# Patient Record
Sex: Female | Born: 1965 | Race: White | Hispanic: No | Marital: Married | Smoking: Never smoker
Health system: Southern US, Community
[De-identification: ages and names within clinical notes are randomized; demographics above are authoritative.]

## PROBLEM LIST (undated history)

## (undated) DIAGNOSIS — C439 Malignant melanoma of skin, unspecified: Secondary | ICD-10-CM

## (undated) DIAGNOSIS — F419 Anxiety disorder, unspecified: Secondary | ICD-10-CM

## (undated) DIAGNOSIS — T8859XA Other complications of anesthesia, initial encounter: Secondary | ICD-10-CM

## (undated) DIAGNOSIS — Z87442 Personal history of urinary calculi: Secondary | ICD-10-CM

## (undated) DIAGNOSIS — I83813 Varicose veins of bilateral lower extremities with pain: Secondary | ICD-10-CM

## (undated) DIAGNOSIS — T4145XA Adverse effect of unspecified anesthetic, initial encounter: Secondary | ICD-10-CM

## (undated) DIAGNOSIS — Z8719 Personal history of other diseases of the digestive system: Secondary | ICD-10-CM

## (undated) DIAGNOSIS — J189 Pneumonia, unspecified organism: Secondary | ICD-10-CM

## (undated) DIAGNOSIS — I219 Acute myocardial infarction, unspecified: Secondary | ICD-10-CM

## (undated) DIAGNOSIS — M1990 Unspecified osteoarthritis, unspecified site: Secondary | ICD-10-CM

## (undated) DIAGNOSIS — I499 Cardiac arrhythmia, unspecified: Secondary | ICD-10-CM

## (undated) DIAGNOSIS — G43909 Migraine, unspecified, not intractable, without status migrainosus: Secondary | ICD-10-CM

## (undated) DIAGNOSIS — J410 Simple chronic bronchitis: Secondary | ICD-10-CM

## (undated) DIAGNOSIS — T7840XA Allergy, unspecified, initial encounter: Secondary | ICD-10-CM

## (undated) DIAGNOSIS — E119 Type 2 diabetes mellitus without complications: Secondary | ICD-10-CM

## (undated) DIAGNOSIS — I10 Essential (primary) hypertension: Secondary | ICD-10-CM

## (undated) DIAGNOSIS — R002 Palpitations: Secondary | ICD-10-CM

## (undated) DIAGNOSIS — J45909 Unspecified asthma, uncomplicated: Secondary | ICD-10-CM

## (undated) HISTORY — DX: Allergy, unspecified, initial encounter: T78.40XA

## (undated) HISTORY — DX: Migraine, unspecified, not intractable, without status migrainosus: G43.909

## (undated) HISTORY — PX: JOINT REPLACEMENT: SHX530

## (undated) HISTORY — PX: TONSILLECTOMY: SUR1361

## (undated) HISTORY — PX: WISDOM TOOTH EXTRACTION: SHX21

## (undated) HISTORY — PX: KNEE ARTHROSCOPY: SUR90

## (undated) HISTORY — DX: Acute myocardial infarction, unspecified: I21.9

## (undated) HISTORY — PX: ANAL FISSURE REPAIR: SHX2312

## (undated) HISTORY — DX: Malignant melanoma of skin, unspecified: C43.9

## (undated) HISTORY — DX: Essential (primary) hypertension: I10

## (undated) HISTORY — DX: Type 2 diabetes mellitus without complications: E11.9

---
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
## 2008-03-08 ENCOUNTER — Ambulatory Visit: Payer: Self-pay | Admitting: Family Medicine

## 2008-04-25 ENCOUNTER — Ambulatory Visit: Payer: Self-pay | Admitting: Internal Medicine

## 2008-04-25 IMAGING — CR RIGHT FOOT COMPLETE - 3+ VIEW
1 series · 3 of 3 positions shown · non-contrast
Comparison: none

REASON FOR EXAM: Pain
COMMENTS:

PROCEDURE:     MDR - MDR FOOT RT COMP W/OBLIQUES  - [DATE] [DATE]
RESULT:     No fracture, dislocation or other acute bony abnormality is
identified. No definite arthritic changes are seen. No lytic or blastic
lesions are noted. No radiodense soft tissue foreign body is identified.

[Series 1: view not recorded · 0.17mm/px · 3 of 3 slices shown]
[im 1/3]
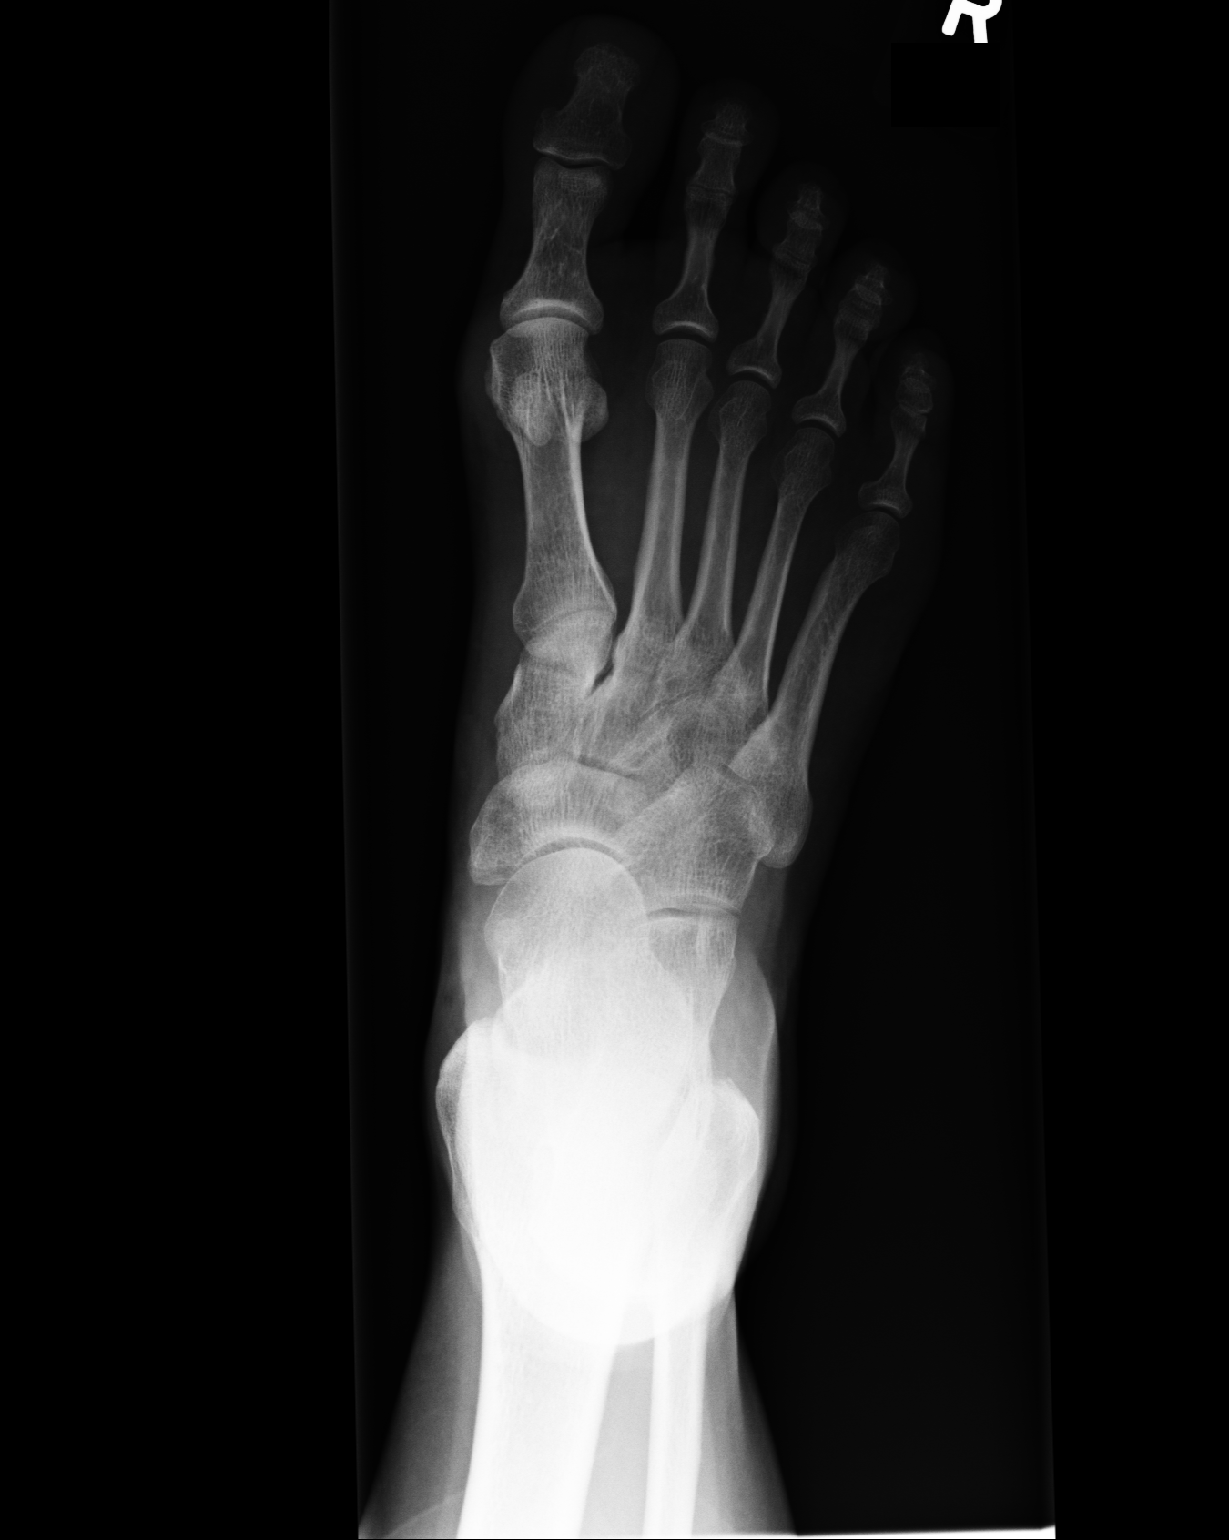
[im 2/3]
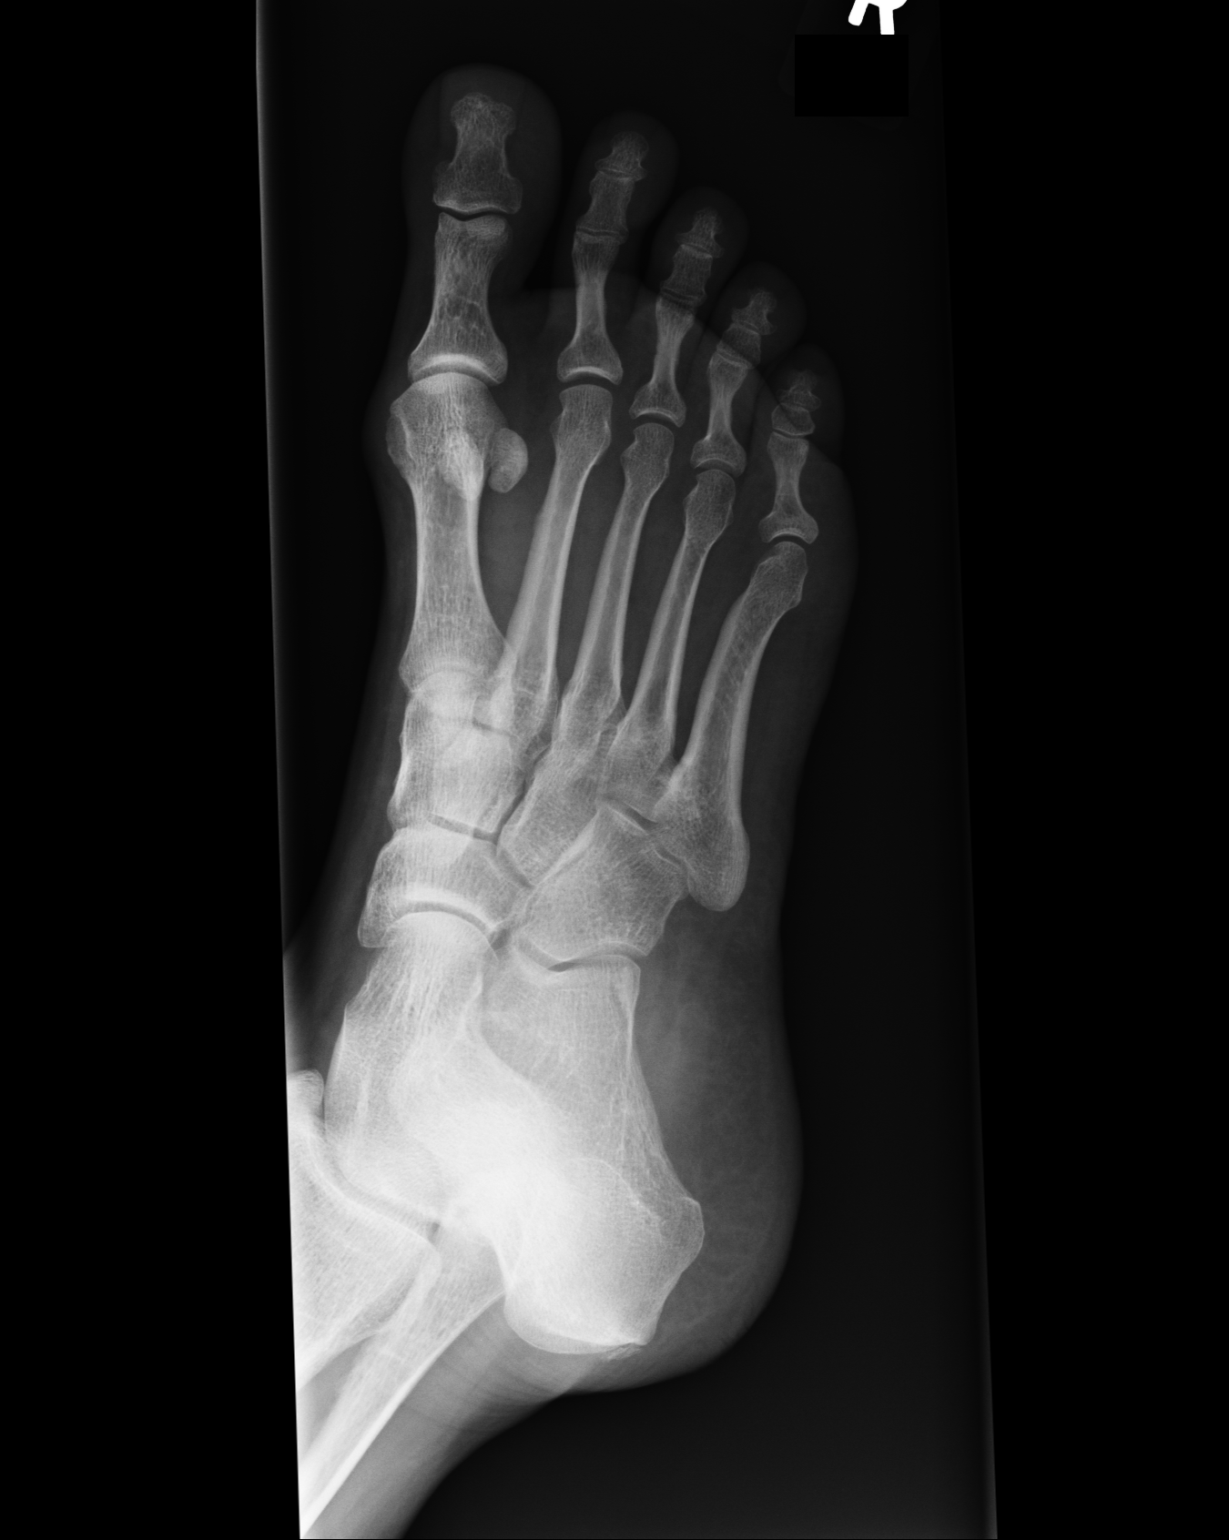
[im 3/3]
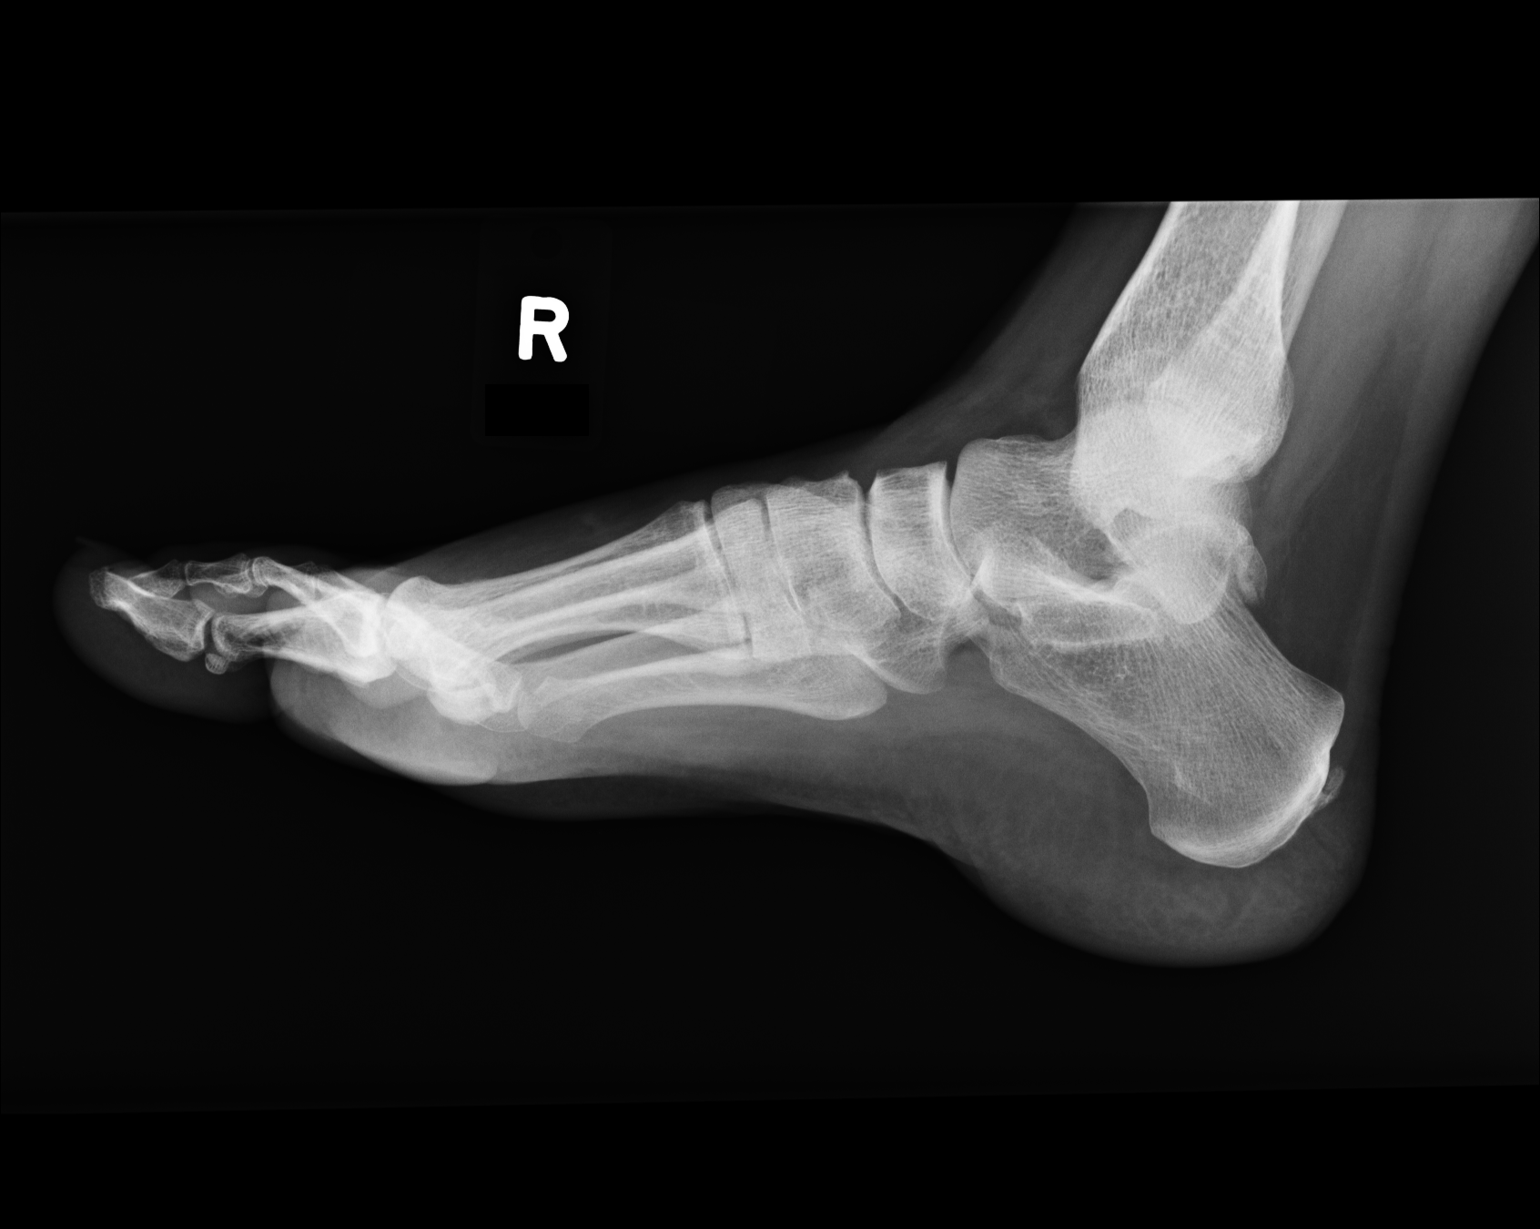

[3 of 3 positions shown; findings below may reference images not displayed]

IMPRESSION: No significant abnormalities are identified.

## 2011-10-12 DIAGNOSIS — G43709 Chronic migraine without aura, not intractable, without status migrainosus: Secondary | ICD-10-CM | POA: Insufficient documentation

## 2011-10-12 DIAGNOSIS — G43009 Migraine without aura, not intractable, without status migrainosus: Secondary | ICD-10-CM | POA: Insufficient documentation

## 2011-12-27 DIAGNOSIS — G500 Trigeminal neuralgia: Secondary | ICD-10-CM | POA: Insufficient documentation

## 2011-12-27 DIAGNOSIS — G894 Chronic pain syndrome: Secondary | ICD-10-CM | POA: Insufficient documentation

## 2012-08-09 DIAGNOSIS — I219 Acute myocardial infarction, unspecified: Secondary | ICD-10-CM

## 2012-08-09 HISTORY — DX: Acute myocardial infarction, unspecified: I21.9

## 2012-11-02 ENCOUNTER — Ambulatory Visit: Payer: Self-pay

## 2012-11-02 LAB — RAPID STREP-A WITH REFLX: Micro Text Report: NEGATIVE

## 2013-01-01 ENCOUNTER — Ambulatory Visit: Payer: Self-pay | Admitting: Family Medicine

## 2013-02-13 DIAGNOSIS — Z96659 Presence of unspecified artificial knee joint: Secondary | ICD-10-CM | POA: Insufficient documentation

## 2013-03-01 DIAGNOSIS — I10 Essential (primary) hypertension: Secondary | ICD-10-CM | POA: Insufficient documentation

## 2013-03-01 DIAGNOSIS — I252 Old myocardial infarction: Secondary | ICD-10-CM | POA: Insufficient documentation

## 2013-03-01 DIAGNOSIS — E119 Type 2 diabetes mellitus without complications: Secondary | ICD-10-CM | POA: Insufficient documentation

## 2013-03-11 DIAGNOSIS — R002 Palpitations: Secondary | ICD-10-CM | POA: Insufficient documentation

## 2013-07-30 DIAGNOSIS — R55 Syncope and collapse: Secondary | ICD-10-CM

## 2013-07-30 HISTORY — DX: Syncope and collapse: R55

## 2014-06-26 DIAGNOSIS — F419 Anxiety disorder, unspecified: Secondary | ICD-10-CM | POA: Insufficient documentation

## 2017-01-20 IMAGING — US US RENAL
1 series · 14 of 25 positions shown · non-contrast
Comparison: Abdomen pelvis radiograph obtained today.

CLINICAL DATA: Acute low back pain. Clinical concern for
nephrolithiasis.

EXAM:
RENAL / URINARY TRACT ULTRASOUND COMPLETE

[Series 1: us renal · 0.22mm/px · 14 of 46 slices shown]
[im 1/46]
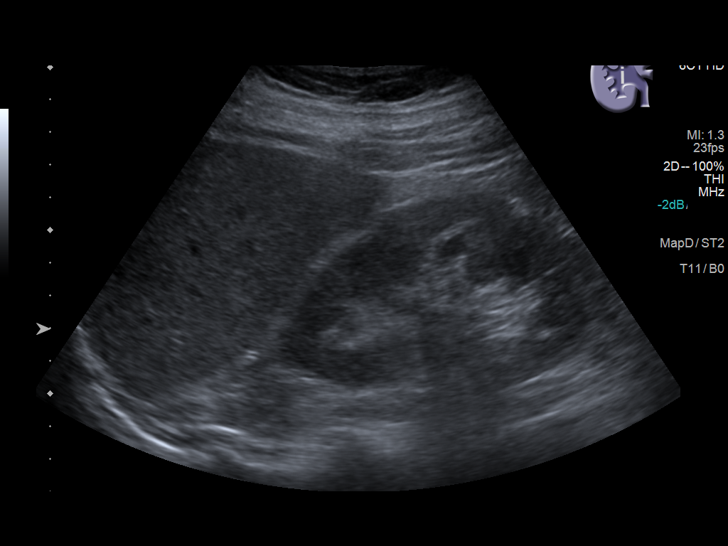
[im 4/46]
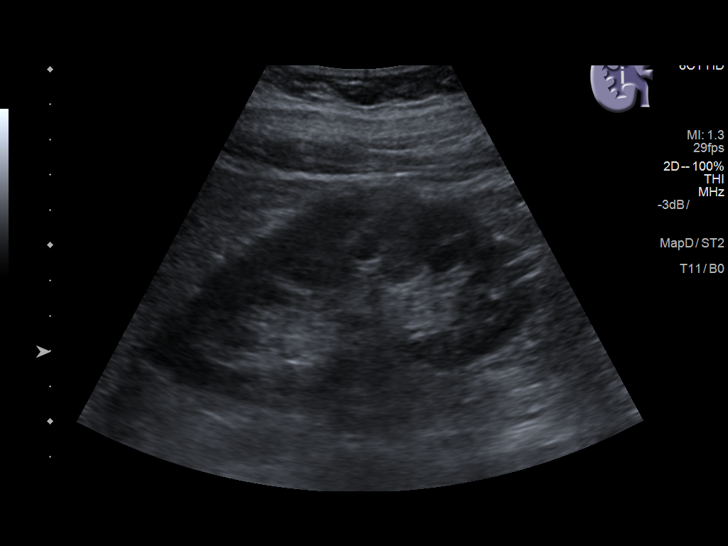
[im 8/46]
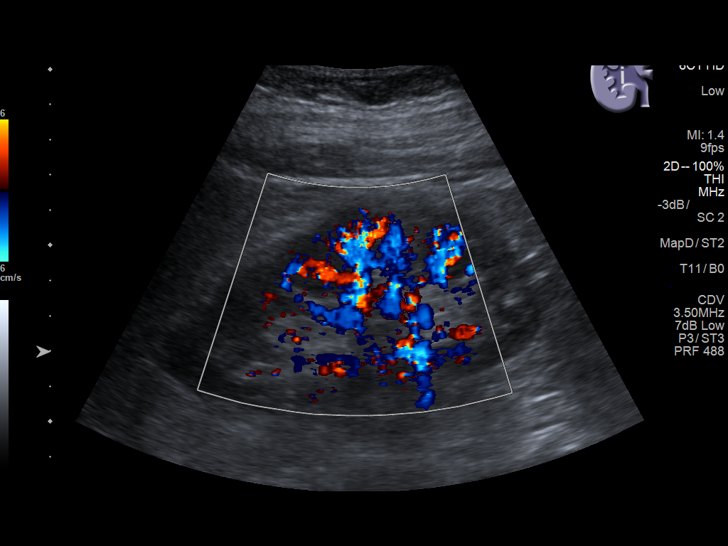
[im 12/46]
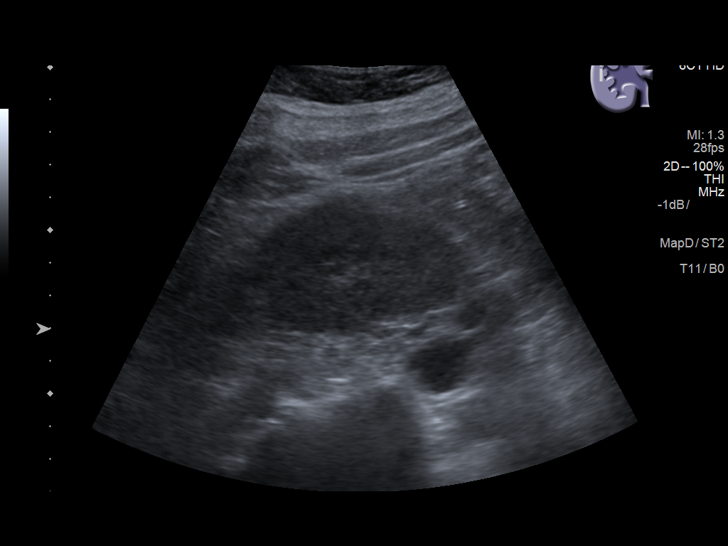
[im 16/46]
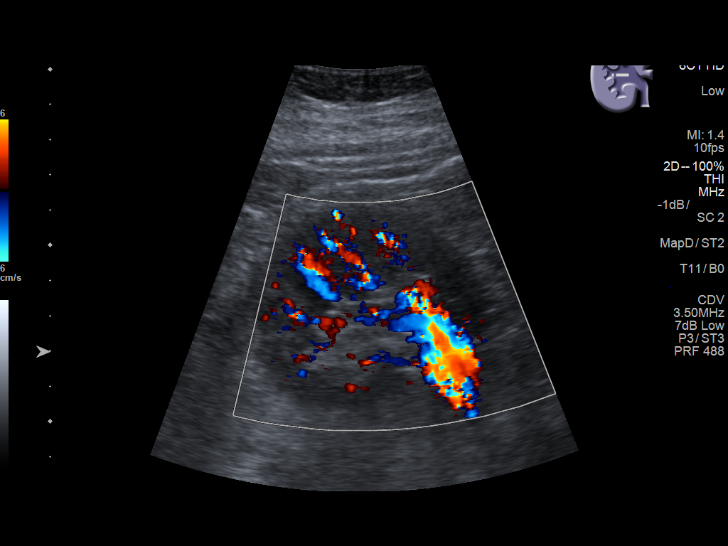
[im 17/46]
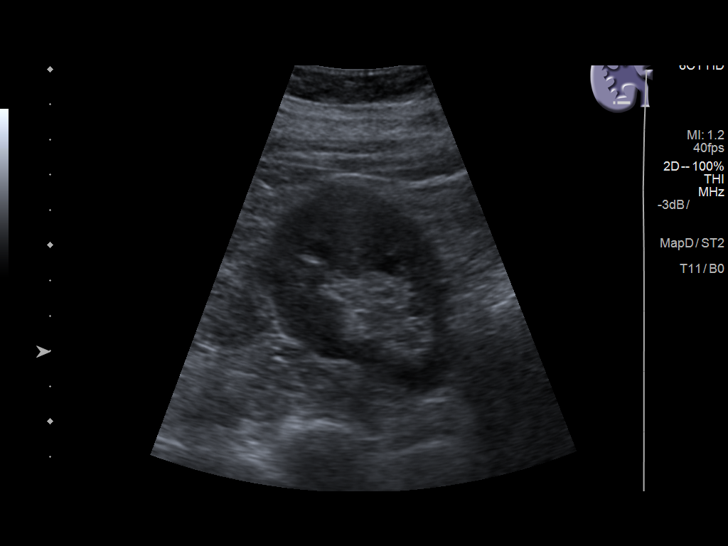
[im 21/46]
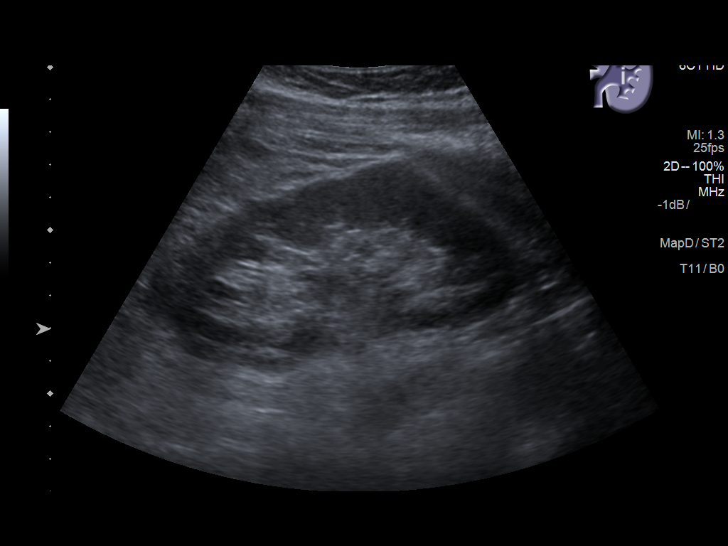
[im 25/46]
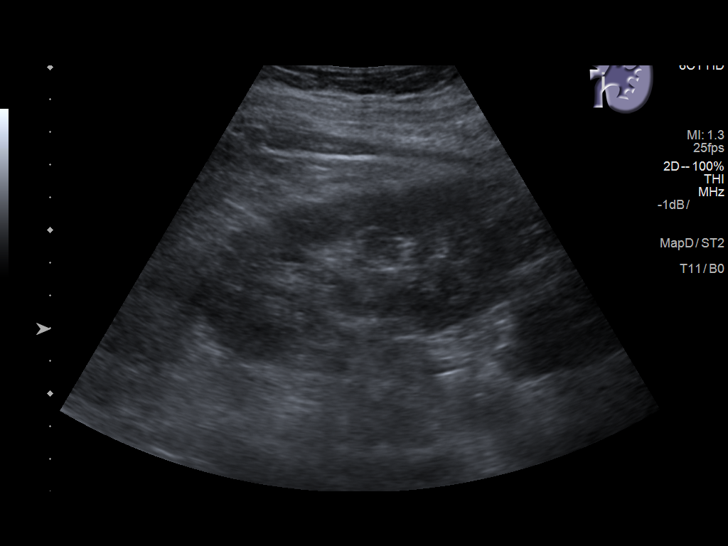
[im 29/46]
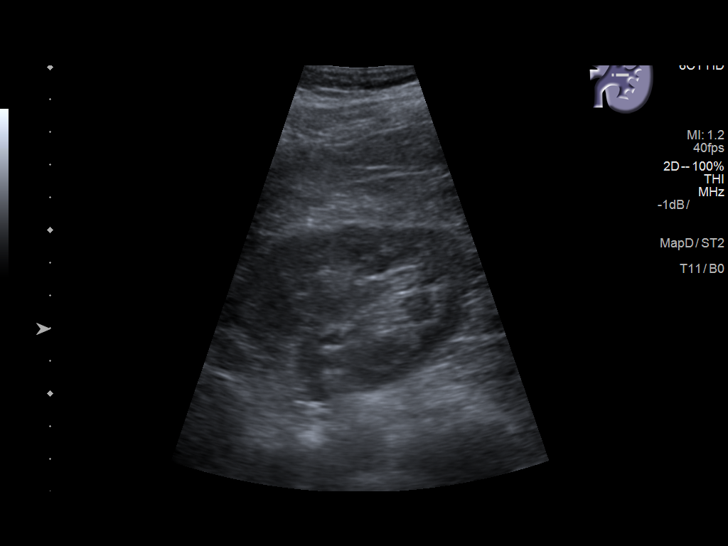
[im 31/46]
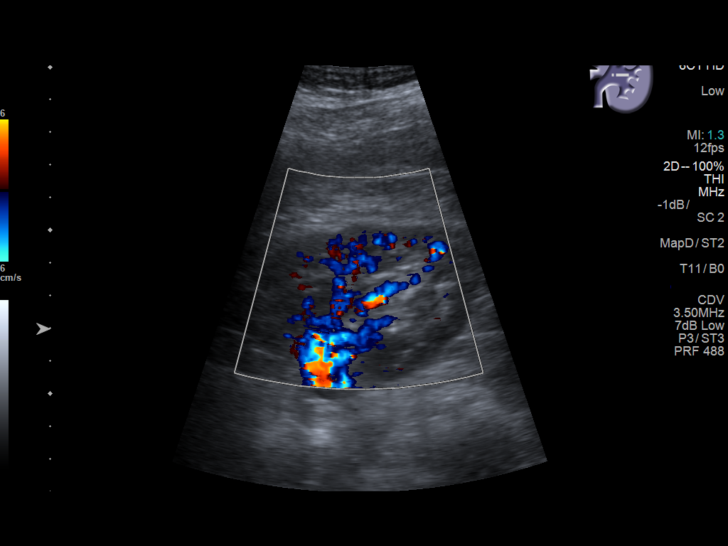
[im 34/46]
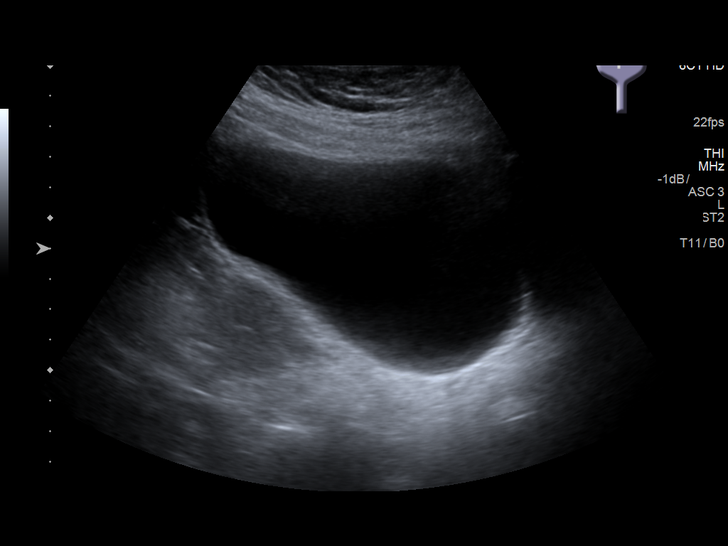
[im 38/46]
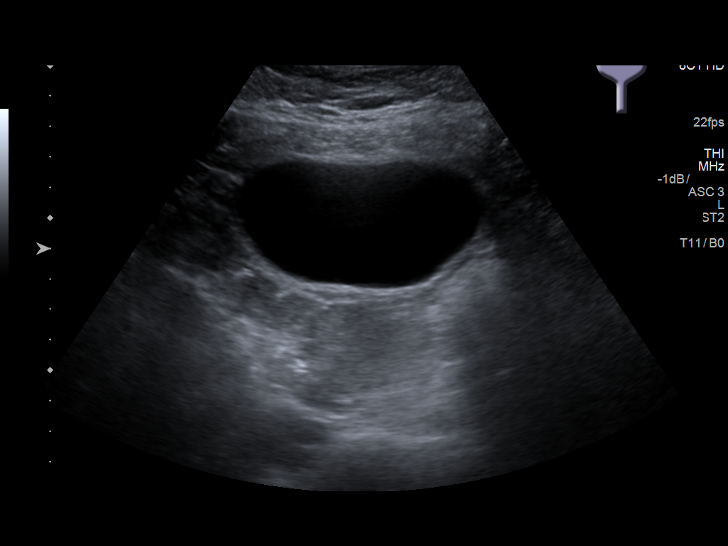
[im 42/46]
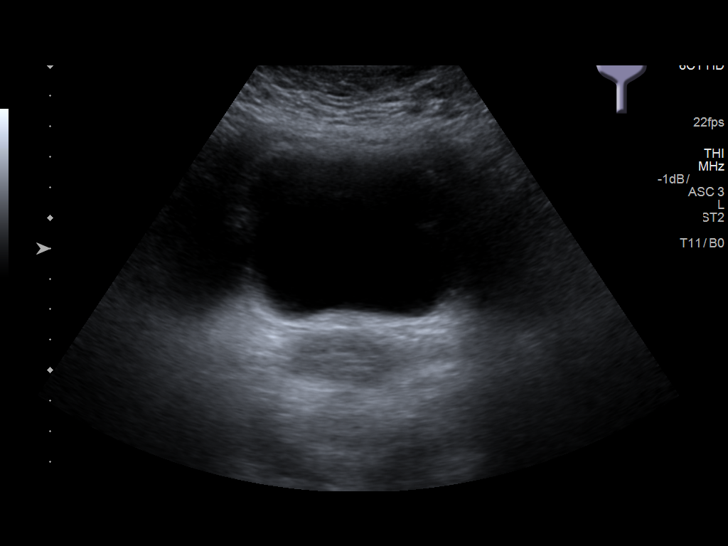
[im 46/46]
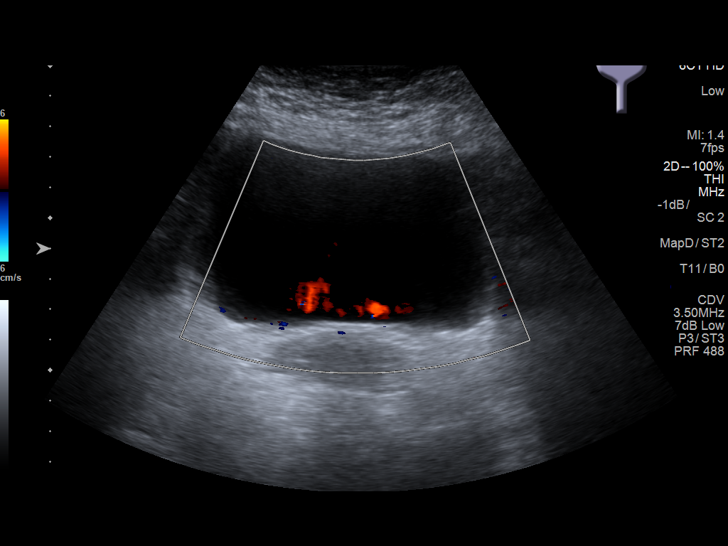

[14 of 25 positions shown; findings below may reference images not displayed]

FINDINGS: Right Kidney:

Renal measurements: 11.0 x 5.3 x 5.1 cm = volume: 158 mL .
Echogenicity within normal limits. No mass or hydronephrosis
visualized.

Left Kidney:

Renal measurements: 11.9 x 5.7 x 5.5 cm = volume: 195 mL.
Echogenicity within normal limits. No mass or hydronephrosis
visualized.

Bladder:

Appears normal for degree of bladder distention.
IMPRESSION: Normal examination.

## 2017-07-12 ENCOUNTER — Ambulatory Visit: Payer: Self-pay | Admitting: Podiatry

## 2017-08-22 ENCOUNTER — Encounter: Payer: Self-pay | Admitting: Family Medicine

## 2017-08-22 ENCOUNTER — Other Ambulatory Visit: Payer: Self-pay

## 2017-08-22 ENCOUNTER — Ambulatory Visit (INDEPENDENT_AMBULATORY_CARE_PROVIDER_SITE_OTHER): Payer: BLUE CROSS/BLUE SHIELD | Admitting: Family Medicine

## 2017-08-22 VITALS — BP 132/84 | HR 76 | Temp 98.2°F | Resp 16 | Wt 200.0 lb

## 2017-08-22 DIAGNOSIS — I10 Essential (primary) hypertension: Secondary | ICD-10-CM

## 2017-08-22 DIAGNOSIS — E1159 Type 2 diabetes mellitus with other circulatory complications: Secondary | ICD-10-CM

## 2017-08-22 DIAGNOSIS — I519 Heart disease, unspecified: Secondary | ICD-10-CM | POA: Diagnosis not present

## 2017-08-22 DIAGNOSIS — K219 Gastro-esophageal reflux disease without esophagitis: Secondary | ICD-10-CM | POA: Diagnosis not present

## 2017-08-22 DIAGNOSIS — E785 Hyperlipidemia, unspecified: Secondary | ICD-10-CM | POA: Diagnosis not present

## 2017-08-22 DIAGNOSIS — L719 Rosacea, unspecified: Secondary | ICD-10-CM

## 2017-08-22 DIAGNOSIS — E119 Type 2 diabetes mellitus without complications: Secondary | ICD-10-CM | POA: Diagnosis not present

## 2017-08-22 DIAGNOSIS — Z7689 Persons encountering health services in other specified circumstances: Secondary | ICD-10-CM

## 2017-08-22 DIAGNOSIS — M1990 Unspecified osteoarthritis, unspecified site: Secondary | ICD-10-CM

## 2017-08-22 DIAGNOSIS — I152 Hypertension secondary to endocrine disorders: Secondary | ICD-10-CM

## 2017-08-22 DIAGNOSIS — B379 Candidiasis, unspecified: Secondary | ICD-10-CM

## 2017-08-22 MED ORDER — ATORVASTATIN CALCIUM 40 MG PO TABS: 40.0000 mg | ORAL_TABLET | Freq: Every day | ORAL | 3 refills | Status: DC

## 2017-08-22 MED ORDER — GLIPIZIDE 10 MG PO TABS: 10.0000 mg | ORAL_TABLET | Freq: Every day | ORAL | 3 refills | Status: DC

## 2017-08-22 MED ORDER — DOXYCYCLINE HYCLATE 20 MG PO TABS: 20.0000 mg | ORAL_TABLET | Freq: Two times a day (BID) | ORAL | 3 refills | Status: DC

## 2017-08-22 MED ORDER — OMEPRAZOLE 20 MG PO CPDR: 20.0000 mg | DELAYED_RELEASE_CAPSULE | Freq: Two times a day (BID) | ORAL | 3 refills | Status: DC

## 2017-08-22 MED ORDER — NAPROXEN 500 MG PO TABS: 500.0000 mg | ORAL_TABLET | Freq: Two times a day (BID) | ORAL | 3 refills | Status: DC

## 2017-08-22 MED ORDER — METOPROLOL SUCCINATE ER 25 MG PO TB24: 25.0000 mg | ORAL_TABLET | Freq: Every day | ORAL | 3 refills | Status: DC

## 2017-08-22 MED ORDER — NITROGLYCERIN 0.4 MG SL SUBL: 0.4000 mg | SUBLINGUAL_TABLET | SUBLINGUAL | 3 refills | Status: DC | PRN

## 2017-08-22 MED ORDER — BENAZEPRIL HCL 40 MG PO TABS: 40.0000 mg | ORAL_TABLET | Freq: Every day | ORAL | 3 refills | Status: DC

## 2017-08-22 MED ORDER — METFORMIN HCL 1000 MG PO TABS: 1000.0000 mg | ORAL_TABLET | Freq: Two times a day (BID) | ORAL | 3 refills | Status: DC

## 2017-08-22 MED ORDER — FLUCONAZOLE 100 MG PO TABS: ORAL_TABLET | ORAL | 3 refills | Status: DC

## 2017-08-22 NOTE — Progress Notes (Signed)
Judith Fox  MRN: [id number] DOB: [birth year]  Subjective:  HPI   Patient is a 52 year old female who presents to establish care and refill medications.  There are no active problems to display for this patient.   Past Medical History:  Diagnosis Date  . Allergy   . Diabetes mellitus without complication ([place])   . Hypertension   . Migraine   . Myocardial infarction [facility])     Social History   Socioeconomic History  . Marital status: Married    Spouse name: Not on file  . Number of children: Not on file  . Years of education: Not on file  . Highest education level: Not on file  Social Needs  . Financial resource strain: Not on file  . Food insecurity - worry: Not on file  . Food insecurity - inability: Not on file  . Transportation needs - medical: Not on file  . Transportation needs - non-medical: Not on file  Occupational History  . Not on file  Tobacco Use  . Smoking status: Never Smoker  Substance and Sexual Activity  . Alcohol use: Yes    Alcohol/week: 1.2 oz    Types: 1 Glasses of wine, 1 Shots of liquor per week    Comment: average varies, possibly once monthly  . Drug use: No  . Sexual activity: Not on file  Other Topics Concern  . Not on file  Social History Narrative  . Not on file    Outpatient Encounter Medications as of 08/22/2017  Medication Sig  . aspirin EC 81 MG tablet Take 81 mg by mouth daily.  Marland Kitchen atorvastatin (LIPITOR) 40 MG tablet Take 1 tablet (40 mg total) by mouth daily.  . benazepril (LOTENSIN) 40 MG tablet Take 1 tablet (40 mg total) by mouth daily.  . diphenhydrAMINE (BENADRYL) 50 MG/ML injection Inject 50 mg into the vein once.  . docusate sodium (STOOL SOFTENER) 100 MG capsule Take 100 mg by mouth 2 (two) times daily.  Marland Kitchen doxycycline (PERIOSTAT) 20 MG tablet Take 1 tablet (20 mg total) by mouth 2 (two) times daily.  . fexofenadine (ALLEGRA) 180 MG tablet Take 180 mg by mouth daily.  . fluconazole (DIFLUCAN) 100 MG tablet  Once weekly and as needed  . glipiZIDE (GLUCOTROL) 10 MG tablet Take 1 tablet (10 mg total) by mouth daily before breakfast.  . ketorolac (TORADOL) 30 MG/ML injection Inject 30 mg into the vein once.  . metFORMIN (GLUCOPHAGE) 1000 MG tablet Take 1 tablet (1,000 mg total) by mouth 2 (two) times daily with a meal.  . metoCLOPramide (REGLAN) 5 MG/ML injection Inject 10 mg into the vein.  . Multiple Vitamin (MULTIVITAMIN) capsule Take 1 capsule by mouth daily.  . naproxen (NAPROSYN) 500 MG tablet Take 1 tablet (500 mg total) by mouth 2 (two) times daily with a meal.  . omeprazole (PRILOSEC) 20 MG capsule Take 1 capsule (20 mg total) by mouth 2 (two) times daily before a meal.  . [DISCONTINUED] atorvastatin (LIPITOR) 40 MG tablet Take 40 mg by mouth daily.  . [DISCONTINUED] benazepril (LOTENSIN) 40 MG tablet Take 40 mg by mouth daily.  . [DISCONTINUED] doxycycline (PERIOSTAT) 20 MG tablet Take 20 mg by mouth 2 (two) times daily.  . [DISCONTINUED] fluconazole (DIFLUCAN) 100 MG tablet Take 100 mg by mouth once a week.  . [DISCONTINUED] glipiZIDE (GLUCOTROL) 10 MG tablet Take 10 mg by mouth daily before breakfast.  . [DISCONTINUED] metFORMIN (GLUCOPHAGE) 1000 MG tablet Take 1,000 mg by mouth  2 (two) times daily with a meal.  . [DISCONTINUED] metoprolol tartrate (LOPRESSOR) 25 MG tablet Take 25 mg by mouth daily.  . [DISCONTINUED] naproxen (NAPROSYN) 500 MG tablet Take 500 mg by mouth 2 (two) times daily with a meal.  . [DISCONTINUED] omeprazole (PRILOSEC) 20 MG capsule Take 20 mg by mouth 2 (two) times daily before a meal.  . metoprolol succinate (TOPROL-XL) 25 MG 24 hr tablet Take 1 tablet (25 mg total) by mouth daily.  . nitroGLYCERIN (NITROSTAT) 0.4 MG SL tablet Place 1 tablet (0.4 mg total) under the tongue every 5 (five) minutes as needed for chest pain.   No facility-administered encounter medications on file as of 08/22/2017.     Allergies  Allergen Reactions  . Stadol [Butorphanol]      Review of Systems  Constitutional: Negative.   HENT: Negative.   Eyes: Negative.   Respiratory: Negative.   Cardiovascular: Negative.   Gastrointestinal: Positive for abdominal pain and diarrhea.       Bloating   Genitourinary: Negative.   Musculoskeletal: Positive for neck pain (with  migraines).  Skin: Negative.   Neurological: Negative.   Endo/Heme/Allergies: Negative.   Psychiatric/Behavioral: Negative.     Objective:  BP 132/84 (BP Location: Right Arm, Patient Position: Sitting, Cuff Size: Normal)   Pulse 76   Temp 98.2 F (36.8 C) (Oral)   Resp 16   Wt 200 lb (90.7 kg)   Physical Exam  Constitutional: She is oriented to person, place, and time and well-developed, well-nourished, and in no distress.  HENT:  Head: Normocephalic and atraumatic.  Eyes: Conjunctivae are normal. No scleral icterus.  Neck: No thyromegaly present.  Cardiovascular: Normal rate, regular rhythm and normal heart sounds.  Pulmonary/Chest: Effort normal and breath sounds normal.  Abdominal: Soft.  Neurological: She is alert and oriented to person, place, and time. Gait normal. GCS score is 15.  Skin: Skin is warm and dry.  Psychiatric: Mood, memory, affect and judgment normal.    Assessment and Plan :   1. Diabetes mellitus without complication (HCC) H7W is 6.7 today. - Comprehensive metabolic panel - Lipid Panel With LDL/HDL Ratio - Hemoglobin A1c - glipiZIDE (GLUCOTROL) 10 MG tablet; Take 1 tablet (10 mg total) by mouth daily before breakfast.  Dispense: 90 tablet; Refill: 3 - metFORMIN (GLUCOPHAGE) 1000 MG tablet; Take 1 tablet (1,000 mg total) by mouth 2 (two) times daily with a meal.  Dispense: 180 tablet; Refill: 3  2. Heart disease  - Ambulatory referral to Cardiology - Lipid Panel With LDL/HDL Ratio - metoprolol succinate (TOPROL-XL) 25 MG 24 hr tablet; Take 1 tablet (25 mg total) by mouth daily.  Dispense: 90 tablet; Refill: 3 - nitroGLYCERIN (NITROSTAT) 0.4 MG SL tablet;  Place 1 tablet (0.4 mg total) under the tongue every 5 (five) minutes as needed for chest pain.  Dispense: 50 tablet; Refill: 3  3. Hyperlipidemia, unspecified hyperlipidemia type  - Comprehensive metabolic panel - atorvastatin (LIPITOR) 40 MG tablet; Take 1 tablet (40 mg total) by mouth daily.  Dispense: 90 tablet; Refill: 3  4. Hypertension associated with diabetes ([place])  - CBC with Differential/Platelet - Comprehensive metabolic panel - TSH - benazepril (LOTENSIN) 40 MG tablet; Take 1 tablet (40 mg total) by mouth daily.  Dispense: 90 tablet; Refill: 3 - metoprolol succinate (TOPROL-XL) 25 MG 24 hr tablet; Take 1 tablet (25 mg total) by mouth daily.  Dispense: 90 tablet; Refill: 3  5. Rosacea  - doxycycline (PERIOSTAT) 20 MG tablet; Take 1 tablet (  20 mg total) by mouth 2 (two) times daily.  Dispense: 180 tablet; Refill: 3 6. Monilia infection  - fluconazole (DIFLUCAN) 100 MG tablet; Once weekly and as needed  Dispense: 24 tablet; Refill: 3  7. Arthritis  - naproxen (NAPROSYN) 500 MG tablet; Take 1 tablet (500 mg total) by mouth 2 (two) times daily with a meal.  Dispense: 180 tablet; Refill: 3  8. Gastroesophageal reflux disease, esophagitis presence not specified  - omeprazole (PRILOSEC) 20 MG capsule; Take 1 capsule (20 mg total) by mouth 2 (two) times daily before a meal.  Dispense: 180 capsule; Refill: 3 9.Migraine Headache Refer back to neurology.  I have done the exam and reviewed the chart and it is accurate to the best of my knowledge. Development worker, community has been used and  any errors in dictation or transcription are unintentional. Miguel Aschoff M.D. [facility]

## 2017-08-23 ENCOUNTER — Telehealth: Payer: Self-pay

## 2017-08-23 NOTE — Telephone Encounter (Signed)
Spoke with patient. She has tried prevacid in the past, no other medications. Omeprazole has worked great for her symptoms. PA completed through cover my meds and has been approved. Pharmacy advised through fax about this and patient advised on her [place], RMA

## 2017-08-23 NOTE — Telephone Encounter (Signed)
LMTCB I need to do PA on Omeprazole. Patient is new to the practice. I need to know if she has taking any other PPIs in the past before starting on PA. -Kris Mouton, RMA

## 2017-09-05 DIAGNOSIS — E782 Mixed hyperlipidemia: Secondary | ICD-10-CM | POA: Insufficient documentation

## 2017-09-09 ENCOUNTER — Ambulatory Visit (INDEPENDENT_AMBULATORY_CARE_PROVIDER_SITE_OTHER): Payer: BLUE CROSS/BLUE SHIELD | Admitting: Family Medicine

## 2017-09-09 ENCOUNTER — Encounter: Payer: Self-pay | Admitting: Family Medicine

## 2017-09-09 VITALS — BP 138/90 | HR 105 | Temp 98.3°F | Ht 69.5 in | Wt 197.6 lb

## 2017-09-09 DIAGNOSIS — B349 Viral infection, unspecified: Secondary | ICD-10-CM | POA: Diagnosis not present

## 2017-09-09 MED ORDER — OSELTAMIVIR PHOSPHATE 75 MG PO CAPS: 75.0000 mg | ORAL_CAPSULE | Freq: Two times a day (BID) | ORAL | 0 refills | Status: DC

## 2017-09-09 MED ORDER — HYDROCODONE-HOMATROPINE 5-1.5 MG/5ML PO SYRP
5.0000 mL | ORAL_SOLUTION | Freq: Three times a day (TID) | ORAL | 0 refills | Status: DC | PRN
Start: 2017-09-09 — End: 2017-10-18

## 2017-09-09 NOTE — Progress Notes (Signed)
Patient: Judith Fox Female    DOB: 11/21/1965   52 y.o.   MRN: [id number] Visit Date: 09/09/2017  Today's Provider: Vernie Murders, PA   Chief Complaint  Patient presents with  . URI   Subjective:    URI   This is a new problem. Episode onset: 3 days ago. The problem has been gradually worsening. Maximum temperature: unknown. Associated symptoms include chest pain, congestion, coughing, headaches, rhinorrhea, sneezing and vomiting (during coughing spells). Pertinent negatives include no sore throat. Associated symptoms comments: Nasal congestion,dizziness, and watery eyes. She has tried acetaminophen (OTC cold medication and cough suppressant) for the symptoms.   Past Medical History:  Diagnosis Date  . Allergy   . Diabetes mellitus without complication ([place])   . Hypertension   . Migraine   . Myocardial infarction [facility])     Family History  Problem Relation Age of Onset  . Migraines Mother   . Cancer Maternal Aunt        lung  . Cancer Maternal Grandmother        breast  . Diabetes Maternal Grandmother   . Cancer Father        died age 67 with metastatic cancer, uncertain origin  . Hypertension Brother    Allergies  Allergen Reactions  . Latex Rash and Swelling    Eye swelling   . Butorphanol Other (See Comments)    Other reaction(s): Other (See Comments) drowsiness somnolence   . Butorphanol Tartrate     Other reaction(s): Other (See Comments), Other (See Comments) drowsiness drowsiness somnolence     Current Outpatient Medications:  .  aspirin EC 81 MG tablet, Take 81 mg by mouth daily., Disp: , Rfl:  .  atorvastatin (LIPITOR) 40 MG tablet, Take 40 mg by mouth daily., Disp: , Rfl: 3 .  benazepril (LOTENSIN) 40 MG tablet, Take 40 mg by mouth daily., Disp: , Rfl: 1 .  diphenhydrAMINE (BENADRYL) 50 MG/ML injection, Inject 50 mg into the vein once., Disp: , Rfl:  .  docusate sodium (STOOL SOFTENER) 100 MG capsule, Take 100 mg by mouth 2  (two) times daily., Disp: , Rfl:  .  doxycycline (PERIOSTAT) 20 MG tablet, Take 1 tablet (20 mg total) by mouth 2 (two) times daily., Disp: 180 tablet, Rfl: 3 .  fexofenadine (ALLEGRA) 180 MG tablet, Take 180 mg by mouth daily., Disp: , Rfl:  .  fluconazole (DIFLUCAN) 100 MG tablet, Once weekly and as needed, Disp: 24 tablet, Rfl: 3 .  glipiZIDE (GLUCOTROL XL) 10 MG 24 hr tablet, Take 10 mg by mouth daily., Disp: , Rfl: 0 .  indomethacin (INDOCIN) 25 MG capsule, TAKE 2 CAPSULES BY MOUTH 3 TIMES A DAY AS NEEDED, Disp: , Rfl: 0 .  ketorolac (TORADOL) 30 MG/ML injection, INJ UTD ONCE FOR MIGRAINE, Disp: , Rfl: 5 .  metFORMIN (GLUCOPHAGE) 1000 MG tablet, Take 1 tablet (1,000 mg total) by mouth 2 (two) times daily with a meal., Disp: 180 tablet, Rfl: 3 .  metoCLOPramide (REGLAN) 5 MG/ML injection, Inject 10 mg into the vein., Disp: , Rfl:  .  Multiple Vitamin (MULTIVITAMIN) capsule, Take 1 capsule by mouth daily., Disp: , Rfl:  .  naproxen (NAPROSYN) 500 MG tablet, Take 500 mg by mouth 2 (two) times daily., Disp: , Rfl: 3 .  nitroGLYCERIN (NITROSTAT) 0.4 MG SL tablet, Place 1 tablet (0.4 mg total) under the tongue every 5 (five) minutes as needed for chest pain., Disp: 50 tablet, Rfl:  3 .  omeprazole (PRILOSEC) 20 MG capsule, Take 1 capsule (20 mg total) by mouth 2 (two) times daily before a meal., Disp: 180 capsule, Rfl: 3  Review of Systems  Constitutional: Negative.   HENT: Positive for congestion, rhinorrhea and sneezing. Negative for sore throat.   Eyes: Positive for discharge.  Respiratory: Positive for cough.   Cardiovascular: Positive for chest pain.  Gastrointestinal: Positive for vomiting (during coughing spells).  Neurological: Positive for dizziness and headaches.   Social History   Tobacco Use  . Smoking status: Never Smoker  . Smokeless tobacco: Never Used  Substance Use Topics  . Alcohol use: Yes    Alcohol/week: 1.2 oz    Types: 1 Glasses of wine, 1 Shots of liquor per week      Comment: average varies, possibly once monthly   Objective:   BP 138/90 (BP Location: Right Arm, Patient Position: Sitting, Cuff Size: Normal)   Pulse (!) 105   Temp 98.3 F (36.8 C) (Oral)   Ht 5' 9.5" (1.765 m)   Wt 197 lb 9.6 oz (89.6 kg)   SpO2 97%   BMI 28.76 kg/m   Physical Exam  Constitutional: She is oriented to person, place, and time. She appears well-developed and well-nourished. No distress.  HENT:  Head: Normocephalic and atraumatic.  Right Ear: Hearing and external ear normal.  Left Ear: Hearing and external ear normal.  Nose: Nose normal.  Eyes: Conjunctivae and lids are normal. Right eye exhibits no discharge. Left eye exhibits no discharge. No scleral icterus.  Neck: Neck supple.  Cardiovascular:  tachycardia  Pulmonary/Chest: Effort normal and breath sounds normal. No respiratory distress.  Abdominal: Soft. Bowel sounds are normal.  Musculoskeletal: Normal range of motion.  Lymphadenopathy:    She has no cervical adenopathy.  Neurological: She is alert and oriented to person, place, and time.  Skin: Skin is intact. No lesion and no rash noted.  Psychiatric: She has a normal mood and affect. Her speech is normal and behavior is normal. Thought content normal.      Assessment & Plan:     1. Acute viral syndrome Onset with cough, body aches, chills, rhinorrhea and headache over the past 2-3 days. Suspect influenza. Will treat with Tamiflu and Hycodan. Increase fluid intake and use Tylenol or Advil prn. Recheck if no better in 5-6 days. - oseltamivir (TAMIFLU) 75 MG capsule; Take 1 capsule (75 mg total) by mouth 2 (two) times daily.  Dispense: 10 capsule; Refill: 0 - HYDROcodone-homatropine (HYCODAN) 5-1.5 MG/5ML syrup; Take 5 mLs by mouth every 8 (eight) hours as needed for cough.  Dispense: 120 mL; Refill: 0       Vernie Murders, PA  [facility]

## 2017-09-09 NOTE — Patient Instructions (Signed)

## 2017-09-12 ENCOUNTER — Encounter: Payer: Self-pay | Admitting: Family Medicine

## 2017-09-12 ENCOUNTER — Ambulatory Visit (INDEPENDENT_AMBULATORY_CARE_PROVIDER_SITE_OTHER): Payer: BLUE CROSS/BLUE SHIELD | Admitting: Family Medicine

## 2017-09-12 ENCOUNTER — Ambulatory Visit
Admission: RE | Admit: 2017-09-12 | Discharge: 2017-09-12 | Disposition: A | Discharge status: Home / Self Care | Payer: BLUE CROSS/BLUE SHIELD | Source: Ambulatory Visit | Attending: Family Medicine | Admitting: Family Medicine

## 2017-09-12 ENCOUNTER — Telehealth: Payer: Self-pay | Admitting: Family Medicine

## 2017-09-12 VITALS — BP 128/84 | HR 107 | Temp 98.1°F | Resp 16 | Wt 193.0 lb

## 2017-09-12 DIAGNOSIS — R059 Cough, unspecified: Secondary | ICD-10-CM

## 2017-09-12 DIAGNOSIS — R05 Cough: Secondary | ICD-10-CM

## 2017-09-12 DIAGNOSIS — R1112 Projectile vomiting: Secondary | ICD-10-CM | POA: Diagnosis not present

## 2017-09-12 DIAGNOSIS — J181 Lobar pneumonia, unspecified organism: Secondary | ICD-10-CM | POA: Insufficient documentation

## 2017-09-12 DIAGNOSIS — J410 Simple chronic bronchitis: Secondary | ICD-10-CM | POA: Diagnosis not present

## 2017-09-12 IMAGING — CR DG CHEST 2V
1 series · 2 of 2 positions shown · non-contrast
Comparison: None.

CLINICAL DATA: Cough.  Fever and body aches.

EXAM:
CHEST  2 VIEW

[Series 1: dg chest 2 view · 0.14mm/px · 2 of 2 slices shown]
[im 1/2]
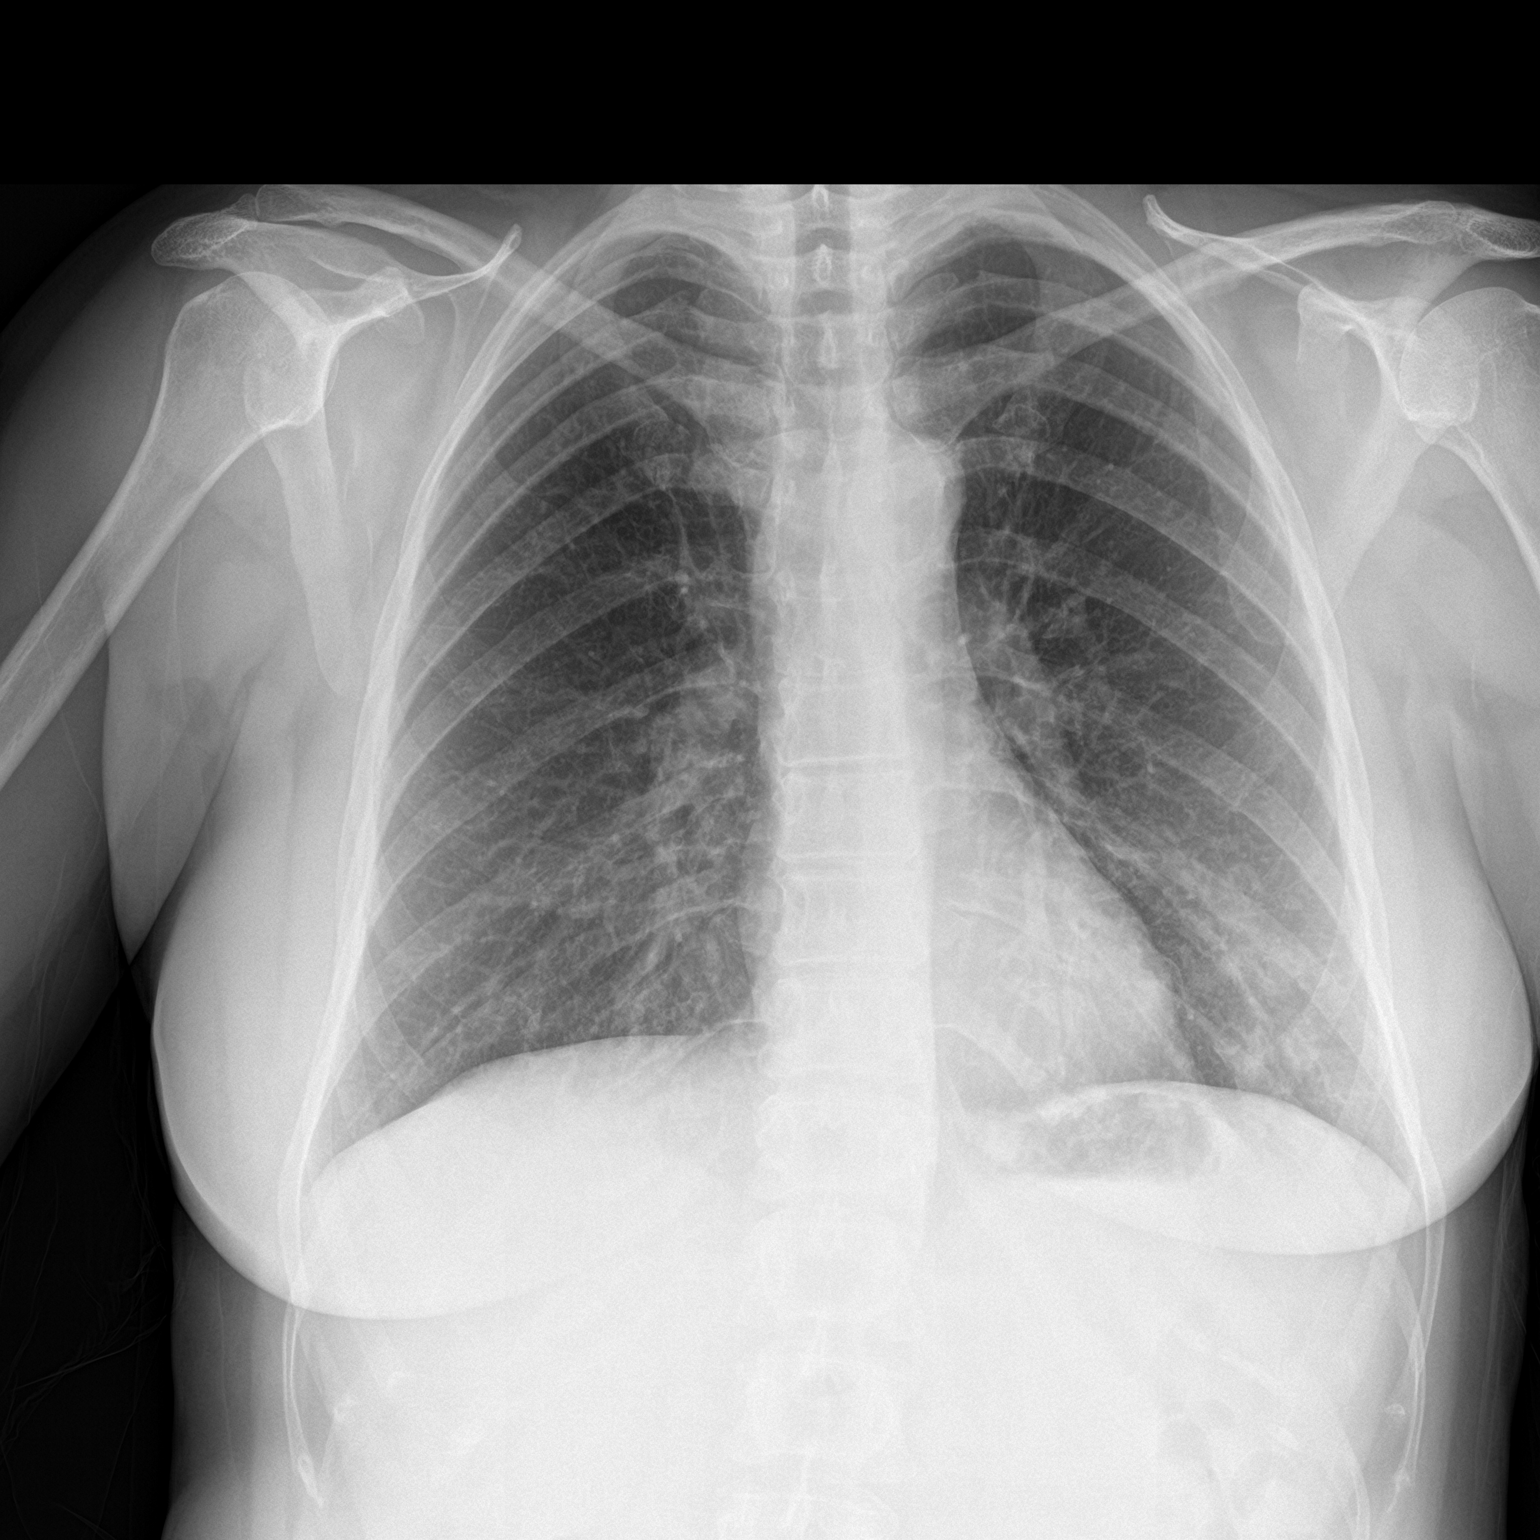
[im 2/2]
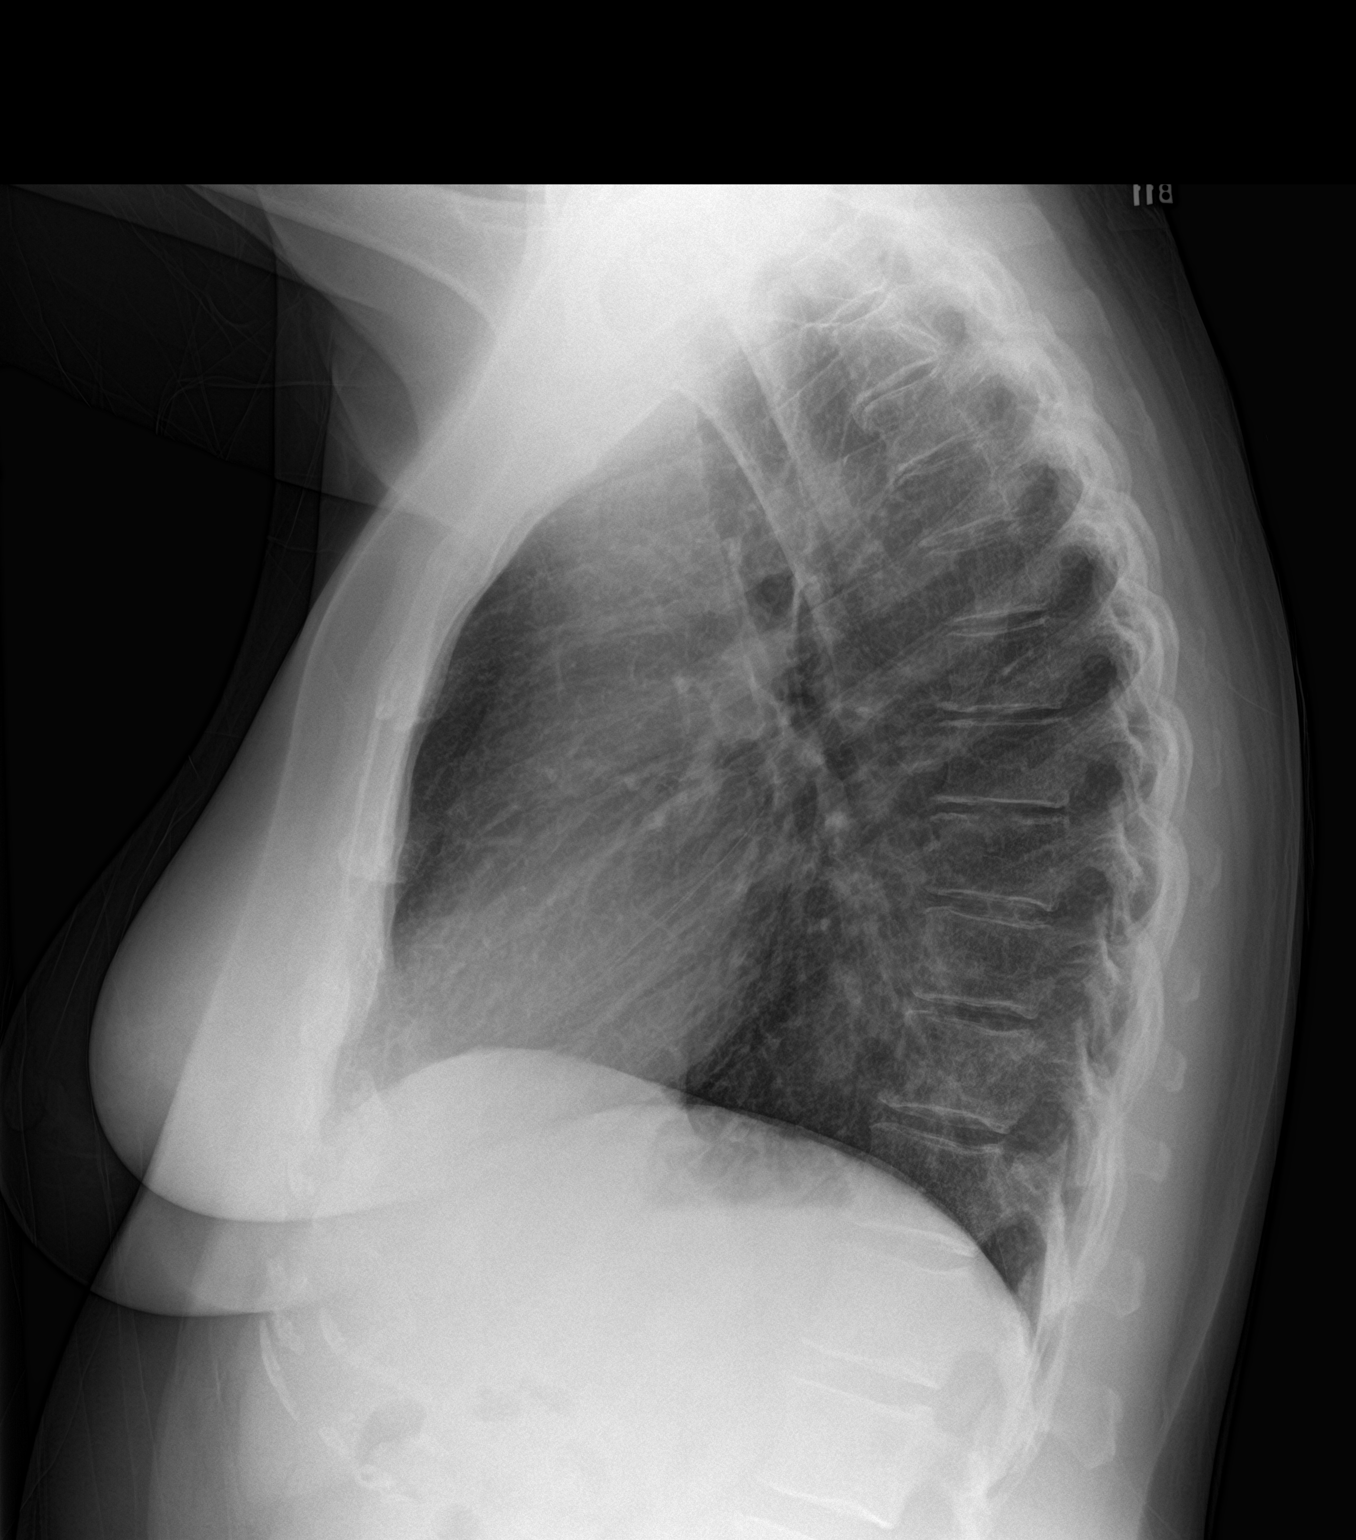

[2 of 2 positions shown; findings below may reference images not displayed]

FINDINGS: Airspace disease in the left lower lobe. No visible cavitation or
effusion. No Kerley lines. Normal heart size and mediastinal
contours. Patient has already been started on antibiotics per EMR.
IMPRESSION: Left lower lobe pneumonia. Followup PA and lateral chest X-ray is
recommended in 3-4 weeks following trial of antibiotic therapy to
ensure resolution.

## 2017-09-12 MED ORDER — PROMETHAZINE HCL 25 MG PO TABS: 25.0000 mg | ORAL_TABLET | Freq: Four times a day (QID) | ORAL | 0 refills | Status: DC | PRN

## 2017-09-12 MED ORDER — AZITHROMYCIN 250 MG PO TABS: ORAL_TABLET | ORAL | 0 refills | Status: DC

## 2017-09-12 MED ORDER — HYDROCOD POLST-CPM POLST ER 10-8 MG/5ML PO SUER: 5.0000 mL | Freq: Two times a day (BID) | ORAL | 0 refills | Status: DC | PRN

## 2017-09-12 MED ORDER — BENZONATATE 100 MG PO CAPS: 100.0000 mg | ORAL_CAPSULE | Freq: Two times a day (BID) | ORAL | 0 refills | Status: DC | PRN

## 2017-09-12 NOTE — Progress Notes (Signed)
Patient: Judith Fox Female    DOB: 30-Oct-1965   52 y.o.   MRN: [id number] Visit Date: 09/12/2017  Today's Provider: Wilhemena Durie, MD   Chief Complaint  Patient presents with  . URI   Subjective:    URI   This is a new (Pt was seen 09/09/2017 by Simona Huh, who diagnosed pt with acute viral syndrome, and treated her with Tamiflu and Hycodan. She states she is not improving.) problem. There has been no fever. Associated symptoms include chest pain, congestion, coughing, headaches, nausea, neck pain (from migraine), rhinorrhea, sneezing, a sore throat, vomiting (projectile vomiting per pt) and wheezing. Pertinent negatives include no abdominal pain, ear pain, sinus pain or swollen glands. Associated symptoms comments: She is also c/o decreased appetite. Treatments tried: Daytime Cold and Flu, Nighttime Cold and Flu, cool mist humidifier. The treatment provided no relief.  She needs a refill on her Hycodan.    Allergies  Allergen Reactions  . Latex Rash and Swelling    Eye swelling   . Butorphanol Other (See Comments)    Other reaction(s): Other (See Comments) drowsiness somnolence   . Butorphanol Tartrate     Other reaction(s): Other (See Comments), Other (See Comments) drowsiness drowsiness somnolence      Current Outpatient Medications:  .  aspirin EC 81 MG tablet, Take 81 mg by mouth daily., Disp: , Rfl:  .  atorvastatin (LIPITOR) 40 MG tablet, Take 40 mg by mouth daily., Disp: , Rfl: 3 .  benazepril (LOTENSIN) 40 MG tablet, Take 40 mg by mouth daily., Disp: , Rfl: 1 .  diphenhydrAMINE (BENADRYL) 50 MG/ML injection, Inject 50 mg into the vein once., Disp: , Rfl:  .  docusate sodium (STOOL SOFTENER) 100 MG capsule, Take 100 mg by mouth 2 (two) times daily., Disp: , Rfl:  .  doxycycline (PERIOSTAT) 20 MG tablet, Take 1 tablet (20 mg total) by mouth 2 (two) times daily., Disp: 180 tablet, Rfl: 3 .  fexofenadine (ALLEGRA) 180 MG tablet, Take 180 mg by mouth  daily., Disp: , Rfl:  .  fluconazole (DIFLUCAN) 100 MG tablet, Once weekly and as needed, Disp: 24 tablet, Rfl: 3 .  glipiZIDE (GLUCOTROL XL) 10 MG 24 hr tablet, Take 10 mg by mouth daily., Disp: , Rfl: 0 .  HYDROcodone-homatropine (HYCODAN) 5-1.5 MG/5ML syrup, Take 5 mLs by mouth every 8 (eight) hours as needed for cough., Disp: 120 mL, Rfl: 0 .  indomethacin (INDOCIN) 25 MG capsule, TAKE 2 CAPSULES BY MOUTH 3 TIMES A DAY AS NEEDED, Disp: , Rfl: 0 .  ketorolac (TORADOL) 30 MG/ML injection, INJ UTD ONCE FOR MIGRAINE, Disp: , Rfl: 5 .  metFORMIN (GLUCOPHAGE) 1000 MG tablet, Take 1 tablet (1,000 mg total) by mouth 2 (two) times daily with a meal., Disp: 180 tablet, Rfl: 3 .  metoCLOPramide (REGLAN) 5 MG/ML injection, Inject 10 mg into the vein., Disp: , Rfl:  .  Multiple Vitamin (MULTIVITAMIN) capsule, Take 1 capsule by mouth daily., Disp: , Rfl:  .  naproxen (NAPROSYN) 500 MG tablet, Take 500 mg by mouth 2 (two) times daily., Disp: , Rfl: 3 .  nitroGLYCERIN (NITROSTAT) 0.4 MG SL tablet, Place 1 tablet (0.4 mg total) under the tongue every 5 (five) minutes as needed for chest pain., Disp: 50 tablet, Rfl: 3 .  omeprazole (PRILOSEC) 20 MG capsule, Take 1 capsule (20 mg total) by mouth 2 (two) times daily before a meal., Disp: 180 capsule, Rfl: 3 .  oseltamivir (TAMIFLU) 75 MG capsule, Take 1 capsule (75 mg total) by mouth 2 (two) times daily., Disp: 10 capsule, Rfl: 0  Review of Systems  Constitutional: Positive for appetite change and unexpected weight change.  HENT: Positive for congestion, rhinorrhea, sneezing and sore throat. Negative for ear pain and sinus pain.   Respiratory: Positive for cough and wheezing.   Cardiovascular: Positive for chest pain.  Gastrointestinal: Positive for nausea and vomiting (projectile vomiting per pt). Negative for abdominal pain.  Musculoskeletal: Positive for neck pain (from migraine).  Neurological: Positive for headaches.    Social History   Tobacco Use    . Smoking status: Never Smoker  . Smokeless tobacco: Never Used  Substance Use Topics  . Alcohol use: Yes    Alcohol/week: 1.2 oz    Types: 1 Glasses of wine, 1 Shots of liquor per week    Comment: average varies, possibly once monthly   Objective:   BP 128/84 (BP Location: Right Arm, Patient Position: Sitting, Cuff Size: Large)   Pulse (!) 107   Temp 98.1 F (36.7 C) (Oral)   Resp 16   Wt 193 lb (87.5 kg)   SpO2 97%   BMI 28.09 kg/m  Vitals:   09/12/17 1020  BP: 128/84  Pulse: (!) 107  Resp: 16  Temp: 98.1 F (36.7 C)  TempSrc: Oral  SpO2: 97%  Weight: 193 lb (87.5 kg)     Physical Exam  Constitutional: She appears well-developed and well-nourished.  HENT:  Head: Normocephalic.  Right Ear: External ear normal.  Left Ear: External ear normal.  Nose: Nose normal.  Mouth/Throat: Oropharynx is clear and moist. No oropharyngeal exudate.  Eyes: Conjunctivae are normal. Pupils are equal, round, and reactive to light.  Neck: Normal range of motion. Neck supple.  Cardiovascular: Regular rhythm and normal heart sounds.  Pulmonary/Chest: No respiratory distress.  Rhonchi bilateral lung bases.  Lymphadenopathy:    She has no cervical adenopathy.  Psychiatric: She has a normal mood and affect. Her behavior is normal.        Assessment & Plan:     1. Cough Worsening. Check CXR. FU pending results. Treat symptomatically with Tussionex and tessalon perles. - chlorpheniramine-HYDROcodone (TUSSIONEX PENNKINETIC ER) 10-8 MG/5ML SUER; Take 5 mLs by mouth every 12 (twelve) hours as needed for cough.  Dispense: 140 mL; Refill: 0 - DG Chest 2 View; Future - benzonatate (TESSALON) 100 MG capsule; Take 1 capsule (100 mg total) by mouth 2 (two) times daily as needed for cough.  Dispense: 20 capsule; Refill: 0  2. Projectile vomiting with nausea Phenergan as below. - promethazine (PHENERGAN) 25 MG tablet; Take 1 tablet (25 mg total) by mouth every 6 (six) hours as needed for  nausea or vomiting.  Dispense: 35 tablet; Refill: 0  3. Simple chronic bronchitis ([place]) Start abx. FU if sx do not  Improve/worsen. FU pending CXR results. - azithromycin (ZITHROMAX) 250 MG tablet; Take 2 tablets on day 1, then 1 tab each remaining day until gone.  Dispense: 6 tablet; Refill: 0     Patient seen and examined by Miguel Aschoff, MD, and note scribed by Martha Clan, CMA.  Richard Cranford Mon, MD  [facility]

## 2017-09-12 NOTE — Telephone Encounter (Signed)
Pt decided to make OV appt no need for message. Thanks TNP

## 2017-09-13 ENCOUNTER — Telehealth: Payer: Self-pay

## 2017-09-13 NOTE — Telephone Encounter (Signed)
[facility]  ED    ----- Message from Jerrol Banana., MD sent at 09/12/2017  4:19 PM EST ----- Pt does have a pneumonia.Probably out of work this week. RTC 1 month. Rest and push fluids.

## 2017-09-13 NOTE — Telephone Encounter (Signed)
-----   Message from Jerrol Banana., MD sent at 09/12/2017  4:19 PM EST ----- Pt does have a pneumonia.Probably out of work this week. RTC 1 month. Rest and push fluids.

## 2017-09-13 NOTE — Telephone Encounter (Signed)
Advised  ED 

## 2017-09-19 ENCOUNTER — Encounter: Payer: Self-pay | Admitting: Family Medicine

## 2017-09-19 ENCOUNTER — Ambulatory Visit (INDEPENDENT_AMBULATORY_CARE_PROVIDER_SITE_OTHER): Payer: BLUE CROSS/BLUE SHIELD | Admitting: Family Medicine

## 2017-09-19 VITALS — BP 116/76 | HR 108 | Temp 97.7°F | Resp 16 | Wt 193.0 lb

## 2017-09-19 DIAGNOSIS — J181 Lobar pneumonia, unspecified organism: Secondary | ICD-10-CM

## 2017-09-19 DIAGNOSIS — R05 Cough: Secondary | ICD-10-CM | POA: Diagnosis not present

## 2017-09-19 DIAGNOSIS — R059 Cough, unspecified: Secondary | ICD-10-CM

## 2017-09-19 DIAGNOSIS — J189 Pneumonia, unspecified organism: Secondary | ICD-10-CM

## 2017-09-19 MED ORDER — PREDNISONE 20 MG PO TABS
ORAL_TABLET | ORAL | 0 refills | Status: DC
Start: 2017-09-19 — End: 2017-10-18

## 2017-09-19 MED ORDER — BENZONATATE 100 MG PO CAPS: ORAL_CAPSULE | ORAL | 0 refills | Status: DC

## 2017-09-19 NOTE — Patient Instructions (Signed)
May use Allegra D for congestion. Call for increased sinus pressure or yellow-green drainage or cough becoming increasingly productive. Don't forget to call about your follow-up chest x-ray in 4 weeks.

## 2017-09-19 NOTE — Progress Notes (Signed)
Subjective:     Patient ID: Judith Fox, female   DOB: 11/26/1965, 52 y.o.   MRN: [id number] Chief Complaint  Patient presents with  . Pneumonia    Follow - up; Feeling better but still coughing and wheezing   HPI Has completed course of Zithromax in the last two days. Reports cough is minimally productive but continues to have bout of coughing triggering dry heaves/vomiting. Has been using Tessalon Perles and Tussionex. Sinus drainage is clear. Accompanied by her s.o. Today.  Review of Systems     Objective:   Physical Exam  Constitutional: She appears well-developed and well-nourished. She appears distressed (modest distress from persistent cough.).  Ears: T.M's intact without inflammation Sinuses: non-tender Throat: tonsils are absent, no pharyngeal erythema Neck: left anterior cervical node Lungs: clear     Assessment:    1. Pneumonia of left lower lobe due to infectious organism ([place] - predniSONE (DELTASONE) 20 MG tablet; One pill twice daily  Dispense: 10 tablet; Refill: 0 - benzonatate (TESSALON) 100 MG capsule; Take 1-2 every 8 hours as needed for cough  Dispense: 30 capsule; Refill: 0    Plan:    May try Allegra D for congestion. Phone f/u if not continuing to improve. F/u CXR in 4 weeks.

## 2017-10-04 ENCOUNTER — Encounter: Payer: Self-pay | Admitting: Intensive Care

## 2017-10-04 ENCOUNTER — Emergency Department
Admission: EM | Admit: 2017-10-04 | Discharge: 2017-10-04 | Disposition: A | Discharge status: Home / Self Care | Payer: BLUE CROSS/BLUE SHIELD | Attending: Emergency Medicine | Admitting: Emergency Medicine

## 2017-10-04 DIAGNOSIS — E119 Type 2 diabetes mellitus without complications: Secondary | ICD-10-CM | POA: Insufficient documentation

## 2017-10-04 DIAGNOSIS — Z96659 Presence of unspecified artificial knee joint: Secondary | ICD-10-CM | POA: Insufficient documentation

## 2017-10-04 DIAGNOSIS — G43101 Migraine with aura, not intractable, with status migrainosus: Secondary | ICD-10-CM | POA: Insufficient documentation

## 2017-10-04 DIAGNOSIS — I10 Essential (primary) hypertension: Secondary | ICD-10-CM | POA: Diagnosis not present

## 2017-10-04 MED ORDER — DIPHENHYDRAMINE HCL 50 MG/ML IJ SOLN
25.0000 mg | Freq: Once | INTRAMUSCULAR | Status: AC
  Administered 2017-10-04: 25 mg via INTRAVENOUS
  Filled 2017-10-04: qty 1

## 2017-10-04 MED ORDER — SODIUM CHLORIDE 0.9 % IV BOLUS (SEPSIS)
1000.0000 mL | Freq: Once | INTRAVENOUS | Status: AC
  Administered 2017-10-04: 1000 mL via INTRAVENOUS

## 2017-10-04 MED ORDER — DIPHENHYDRAMINE HCL 50 MG PO TABS: 25.0000 mg | ORAL_TABLET | Freq: Three times a day (TID) | ORAL | 0 refills | Status: DC | PRN

## 2017-10-04 MED ORDER — KETOROLAC TROMETHAMINE 10 MG PO TABS: 10.0000 mg | ORAL_TABLET | Freq: Three times a day (TID) | ORAL | 0 refills | Status: DC | PRN

## 2017-10-04 MED ORDER — MAGNESIUM SULFATE 2 GM/50ML IV SOLN
2.0000 g | Freq: Once | INTRAVENOUS | Status: AC
  Administered 2017-10-04: 2 g via INTRAVENOUS
  Filled 2017-10-04: qty 50

## 2017-10-04 MED ORDER — KETOROLAC TROMETHAMINE 30 MG/ML IJ SOLN
30.0000 mg | Freq: Once | INTRAMUSCULAR | Status: AC
  Administered 2017-10-04: 30 mg via INTRAVENOUS
  Filled 2017-10-04: qty 1

## 2017-10-04 MED ORDER — DIPHENHYDRAMINE HCL 25 MG PO CAPS
25.0000 mg | ORAL_CAPSULE | Freq: Once | ORAL | Status: DC
  Filled 2017-10-04: qty 1

## 2017-10-04 MED ORDER — METOCLOPRAMIDE HCL 5 MG/ML IJ SOLN
10.0000 mg | Freq: Once | INTRAMUSCULAR | Status: AC
  Administered 2017-10-04: 10 mg via INTRAVENOUS
  Filled 2017-10-04: qty 2

## 2017-10-04 MED ORDER — PROCHLORPERAZINE EDISYLATE 5 MG/ML IJ SOLN
10.0000 mg | Freq: Once | INTRAMUSCULAR | Status: AC
  Administered 2017-10-04: 10 mg via INTRAVENOUS
  Filled 2017-10-04: qty 2

## 2017-10-04 MED ORDER — PROCHLORPERAZINE MALEATE 10 MG PO TABS: 10.0000 mg | ORAL_TABLET | Freq: Four times a day (QID) | ORAL | 0 refills | Status: DC | PRN

## 2017-10-04 MED ORDER — DEXAMETHASONE SODIUM PHOSPHATE 10 MG/ML IJ SOLN
10.0000 mg | Freq: Once | INTRAMUSCULAR | Status: AC
  Administered 2017-10-04: 10 mg via INTRAVENOUS
  Filled 2017-10-04: qty 1

## 2017-10-04 NOTE — ED Provider Notes (Addendum)
[facility] Emergency Department Provider Note  ____________________________________________  Time seen: Approximately 9:24 AM  I have reviewed the triage vital signs and the nursing notes.   HISTORY  Chief Complaint Migraine    HPI Judith Fox is a 52 y.o. female with a history of difficult to control migraines presenting with typical migraine.  The patient reports that she has several migraines monthly, and usually treats herself with injectable Toradol, Compazine and Benadryl.  Yesterday, she began to develop a pressure sensation behind the left orbit and photosensitivity, nausea without vomiting, typical of her usual headache.  She gave herself her usual "migraine cocktail," before bed, but when she woke up this morning her headache was getting progressively worse.  She reports that every 1.5-3 years, she requires ED assistance for her headaches.  She denies any new character or severity in her symptoms, trauma, fever, or new neurologic symptoms including visual changes, speech changes, numbness tingling or weakness, difficulty walking.  No travel outside the [place].  Past Medical History:  Diagnosis Date  . Allergy   . Diabetes mellitus without complication ([place])   . Hypertension   . Migraine   . Myocardial infarction [facility])     Patient Active Problem List   Diagnosis Date Noted  . Mixed hyperlipidemia 09/05/2017  . Anxiety 06/26/2014  . Syncope and collapse 07/30/2013  . Palpitations 03/11/2013  . Diabetes ([place]) 03/01/2013  . History of non-ST elevation myocardial infarction (NSTEMI) 03/01/2013  . Essential hypertension 03/01/2013  . 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  . Chronic pain syndrome 12/27/2011  . 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 Chronic migraine without aura 10/12/2011  . Migraine without aura 10/12/2011    Past Surgical History:  Procedure Laterality Date  . JOINT REPLACEMENT Right    knee replacement     Current Outpatient Rx  . Order #: [id number] Class: Historical Med  . Order #: [id number] Class: Historical Med  . Order #: [id number] Class: Normal  . Order #: [id number] Class: Historical Med  . Order #: [id number] Class: Normal  . Order #: [id number] Class: Print  . Order #: [id number] Class: Print  . Order #: [id number] Class: Historical Med  . Order #: [id number] Class: Historical Med  . Order #: [id number] Class: Normal  . Order #: [id number] Class: Historical Med  . Order #: [id number] Class: Historical Med  . Order #: [id number] Class: Print  . Order #: [id number] Class: Historical Med  . Order #: [id number] Class: Print  . Order #: [id number] Class: Historical Med  . Order #: [id number] Class: Normal  . Order #: [id number] Class: Historical Med  . Order #: [id number] Class: Historical Med  . Order #: [id number] Class: Historical Med  . Order #: [id number] Class: Normal  . Order #: [id number] Class: Normal  . Order #: [id number] Class: Print  . Order #: [id number] Class: Normal  . Order #: [id number] Class: Print  . Order #: [id number] Class: Normal    Allergies Latex; Butorphanol; and Butorphanol tartrate  Family History  Problem Relation Age of Onset  . Migraines Mother   . Cancer Maternal Aunt        lung  . Cancer Maternal Grandmother        breast  . Diabetes Maternal Grandmother   . Cancer Father        died age 78 with metastatic cancer, uncertain origin  . Hypertension Brother     Social History Social History   Tobacco Use  . Smoking status: Never Smoker  . Smokeless tobacco: Never Used  Substance Use Topics  . Alcohol use: Yes  Alcohol/week: 1.2 oz    Types: 1 Glasses of wine, 1 Shots of liquor per week    Comment: average varies, possibly once monthly  . Drug use: No    Review of Systems Constitutional: No fever/chills.  No trauma.  No lightheadedness or syncope.   Eyes: No visual changes.  No blurred or double vision.  Positive photosensitivity.  ENT: No sore throat. No  congestion or rhinorrhea. Cardiovascular: Denies chest pain. Denies palpitations. Respiratory: Denies shortness of breath.  No cough. Gastrointestinal: No abdominal pain.  No nausea, no vomiting.  No diarrhea.  No constipation. Genitourinary: Negative for dysuria. Musculoskeletal: Negative for back pain. Skin: Negative for rash. Neurological: Positive for headaches. No focal numbness, tingling or weakness.  No difficulty walking.  No changes in vision, speech or mental status per    ____________________________________________   PHYSICAL EXAM:  VITAL SIGNS: ED Triage Vitals  Enc Vitals Group     BP 10/04/17 0809 (!) 154/95     Pulse Rate 10/04/17 0809 87     Resp 10/04/17 0809 16     Temp 10/04/17 0809 98.1 F (36.7 C)     Temp Source 10/04/17 0809 Oral     SpO2 10/04/17 0809 98 %     Weight 10/04/17 0810 193 lb (87.5 kg)     Height 10/04/17 0810 5' 9.5" (1.765 m)     Head Circumference --      Peak Flow --      Pain Score 10/04/17 0810 7     Pain Loc --      Pain Edu? --      Excl. in [place]? --     Constitutional: Alert and oriented. Well appearing and in no acute distress. Answers questions appropriately. Eyes: Conjunctivae are normal.  EOMI. PERRLA.  No vertical or horizontal nystagmus.  No scleral icterus. Head: Atraumatic. Nose: No congestion/rhinnorhea. Mouth/Throat: Mucous membranes are moist.  Neck: No stridor.  Supple.  No JVD.  No meningismus.  Full range of motion without pain. Cardiovascular: Normal rate, regular rhythm. No murmurs, rubs or gallops.  Respiratory: Normal respiratory effort.  No accessory muscle use or retractions. Lungs CTAB.  No wheezes, rales or ronchi. Gastrointestinal: Soft, nontender and nondistended.  No guarding or rebound.  No peritoneal signs. Musculoskeletal: No LE edema. Neurologic:  A&Ox3.  Speech is clear.  Face and smile are symmetric.  EOMI. PERRLA.  No horizontal or vertical nystagmus.  Moves all extremities well.  Normal gait  without ataxia. Skin:  Skin is warm, dry and intact. No rash noted. Psychiatric: Mood and affect are normal. Speech and behavior are normal.  Normal judgement  ____________________________________________   LABS (all labs ordered are listed, but only abnormal results are displayed)  Labs Reviewed - No data to display ____________________________________________  EKG  Not Indicated ____________________________________________  RADIOLOGY  No results found.  ____________________________________________   PROCEDURES  Procedure(s) performed: None  Procedures  Critical Care performed: No ____________________________________________   INITIAL IMPRESSION / ASSESSMENT AND PLAN / ED COURSE  Pertinent labs & imaging results that were available during my care of the patient were reviewed by me and considered in my medical decision making (see chart for details).  52 y.o. female with a history of acute on chronic migraine presenting for treatment failure at home.  Today, the patient has no change in severity or character of her headaches; a new cause for her headaches is very unlikely.  I do not suspect meningitis, subarachnoid hemorrhage.  I will  plan to treat the patient with magnesium, Decadron, Reglan, intravenous fluids and Benadryl.  Plan reevaluation for final disposition.  ----------------------------------------- 11:05 AM on 10/04/2017 -----------------------------------------  Patient continues to have a migraine.  We will use her usual migraine cocktail and reevaluate her for final disposition.  ----------------------------------------- 12:12 PM on 10/04/2017 -----------------------------------------  The patient is feeling significantly better at this time.  We will plan to discharge her home.  She understands return precautions as well as follow-up instructions  ____________________________________________  FINAL CLINICAL IMPRESSION(S) / ED DIAGNOSES  Final  diagnoses:  Migraine with aura and with status migrainosus, not intractable         NEW MEDICATIONS STARTED DURING THIS VISIT:  New Prescriptions   DIPHENHYDRAMINE (BENADRYL) 50 MG TABLET    Take 0.5 tablets (25 mg total) by mouth every 8 (eight) hours as needed (migraine).   KETOROLAC (TORADOL) 10 MG TABLET    Take 1 tablet (10 mg total) by mouth every 8 (eight) hours as needed for moderate pain (with food).   PROCHLORPERAZINE (COMPAZINE) 10 MG TABLET    Take 1 tablet (10 mg total) by mouth every 6 (six) hours as needed for nausea or vomiting (migraine).      Eula Listen, MD 10/04/17 1112    Eula Listen, MD 10/04/17 [phone]

## 2017-10-04 NOTE — ED Notes (Signed)
Pt ambulatory to POV, VSS, NAD. Pt reports that her headache is much better. Discharge instructions, RX and follow up discussed. All questions answered.

## 2017-10-04 NOTE — ED Triage Notes (Signed)
Patient c/o migraine that has been intense for two days. HX migraines since college. Ambulatory in Er with no problems. C/O light and noise sensitivity

## 2017-10-04 NOTE — ED Notes (Signed)
Pt reports that she is doing a little bit better. Lights turned down, husband at bedside.

## 2017-10-04 NOTE — ED Notes (Signed)
FN: pt c/o migraine. Pt amb to stat desk with no difficulty noted.

## 2017-10-04 NOTE — Discharge Instructions (Addendum)
If you develop headache, please treat yourself early in the course of your headache as it is easier to improve a migraine early on rather than waiting until it is full-blown.  If you take oral Toradol, Compazine and Benadryl, do not take your injectable medications.  Please take Toradol with food, and avoid other NSAID medications including Motrin, Advil, Aleve, or ibuprofen if you take Toradol.  Plenty of fluids, eat small healthy regular meals throughout the day, and get plenty of rest to prevent migraines.  Return to the emergency department if you develop severe pain, nausea or vomiting, fever, numbness tingling or weakness, difficulty walking, changes in mental status, or any other symptoms concerning to you.

## 2017-10-18 ENCOUNTER — Other Ambulatory Visit: Payer: Self-pay

## 2017-10-18 ENCOUNTER — Ambulatory Visit
Admission: RE | Admit: 2017-10-18 | Discharge: 2017-10-18 | Disposition: A | Discharge status: Home / Self Care | Payer: BLUE CROSS/BLUE SHIELD | Source: Ambulatory Visit | Attending: Family Medicine | Admitting: Family Medicine

## 2017-10-18 ENCOUNTER — Ambulatory Visit (INDEPENDENT_AMBULATORY_CARE_PROVIDER_SITE_OTHER): Payer: BLUE CROSS/BLUE SHIELD | Admitting: Family Medicine

## 2017-10-18 VITALS — BP 148/82 | HR 80 | Temp 98.1°F | Resp 16

## 2017-10-18 DIAGNOSIS — R05 Cough: Secondary | ICD-10-CM | POA: Diagnosis not present

## 2017-10-18 DIAGNOSIS — J189 Pneumonia, unspecified organism: Secondary | ICD-10-CM

## 2017-10-18 DIAGNOSIS — Z8701 Personal history of pneumonia (recurrent): Secondary | ICD-10-CM | POA: Insufficient documentation

## 2017-10-18 DIAGNOSIS — J181 Lobar pneumonia, unspecified organism: Secondary | ICD-10-CM | POA: Diagnosis not present

## 2017-10-18 DIAGNOSIS — R059 Cough, unspecified: Secondary | ICD-10-CM

## 2017-10-18 IMAGING — CR DG CHEST 2V
1 series · 2 of 2 positions shown · non-contrast
Comparison: [DATE]

CLINICAL DATA: Persistent cough, history of left basilar infiltrate

EXAM:
CHEST - 2 VIEW

[Series 1: dg chest 2 view · 0.14mm/px · 2 of 2 slices shown]
[im 1/2]
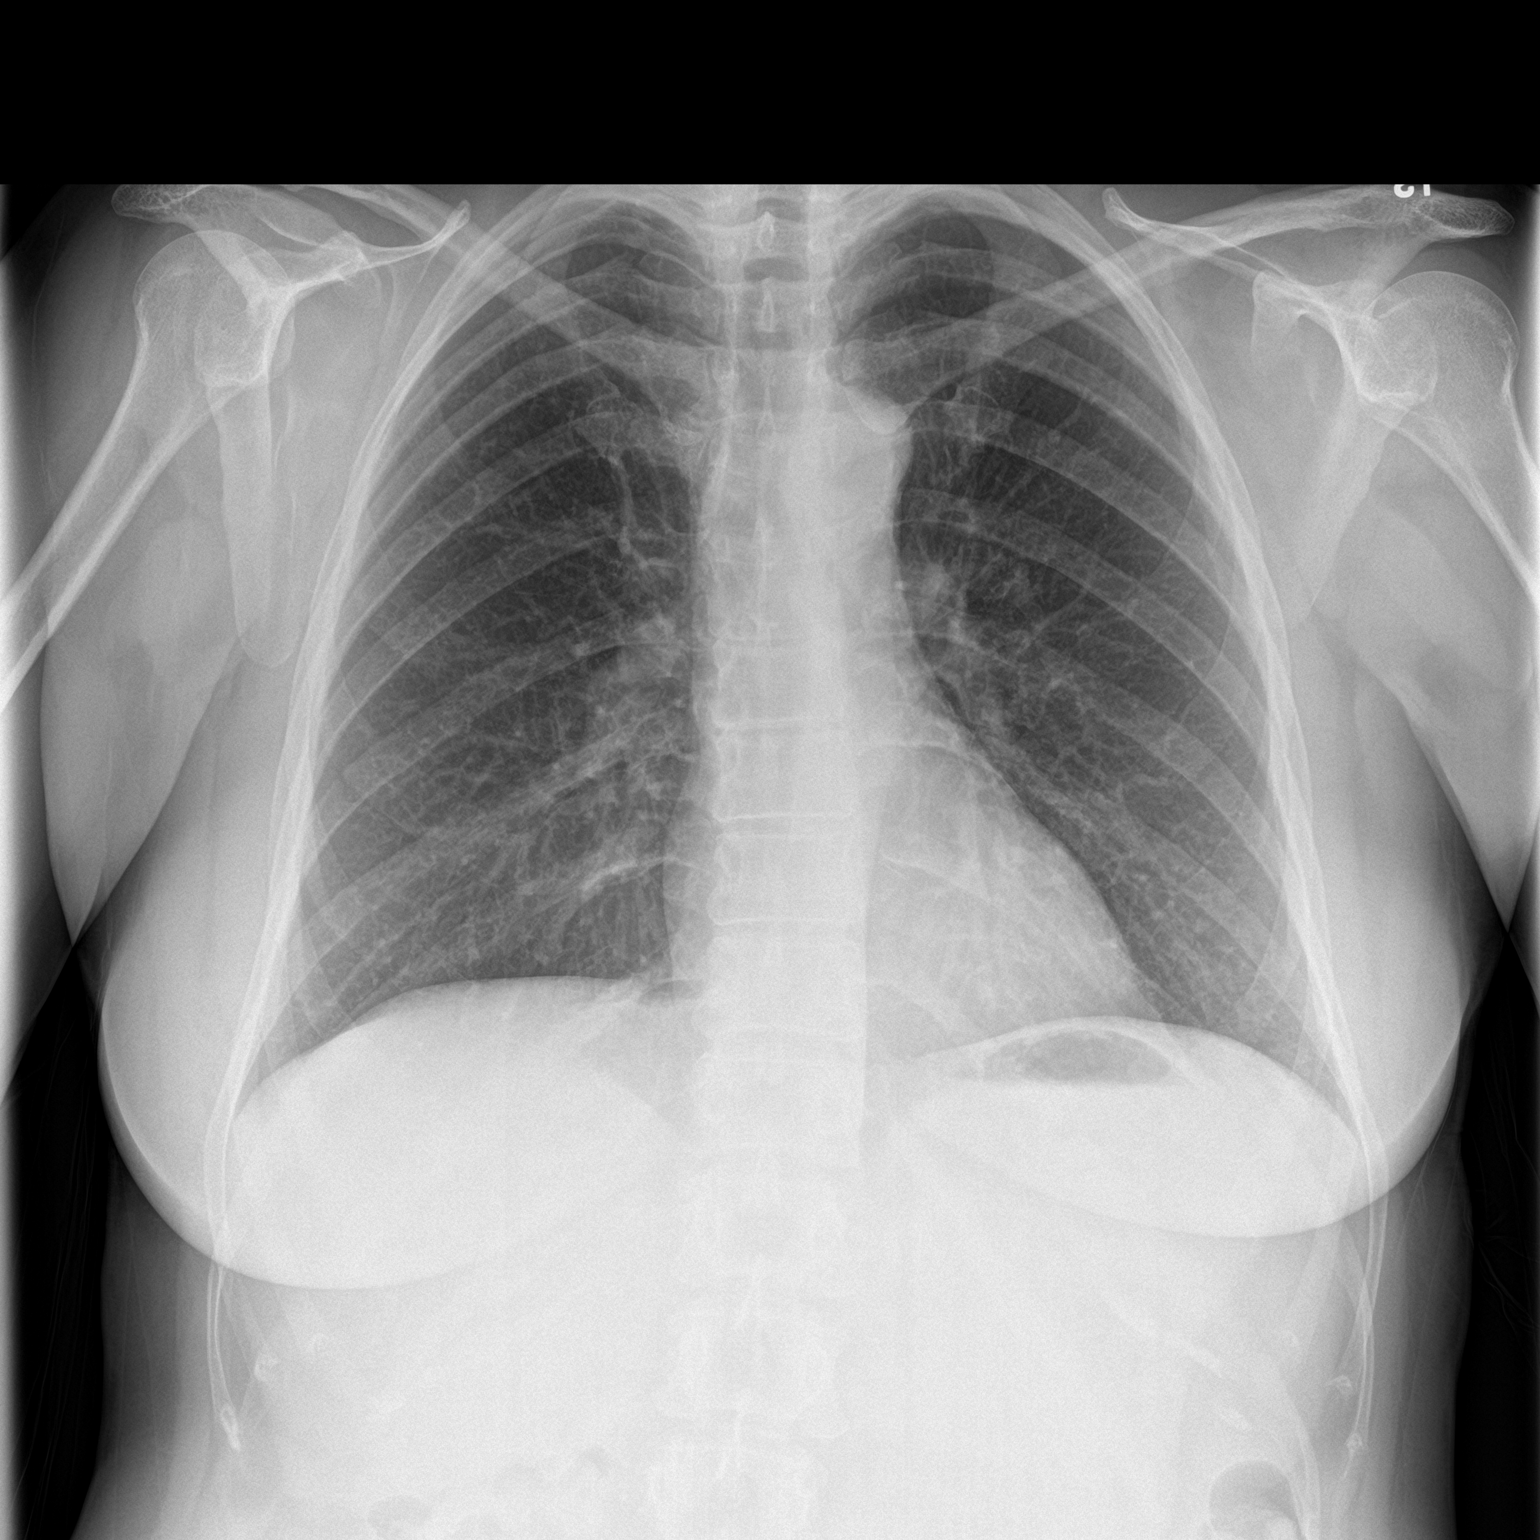
[im 2/2]
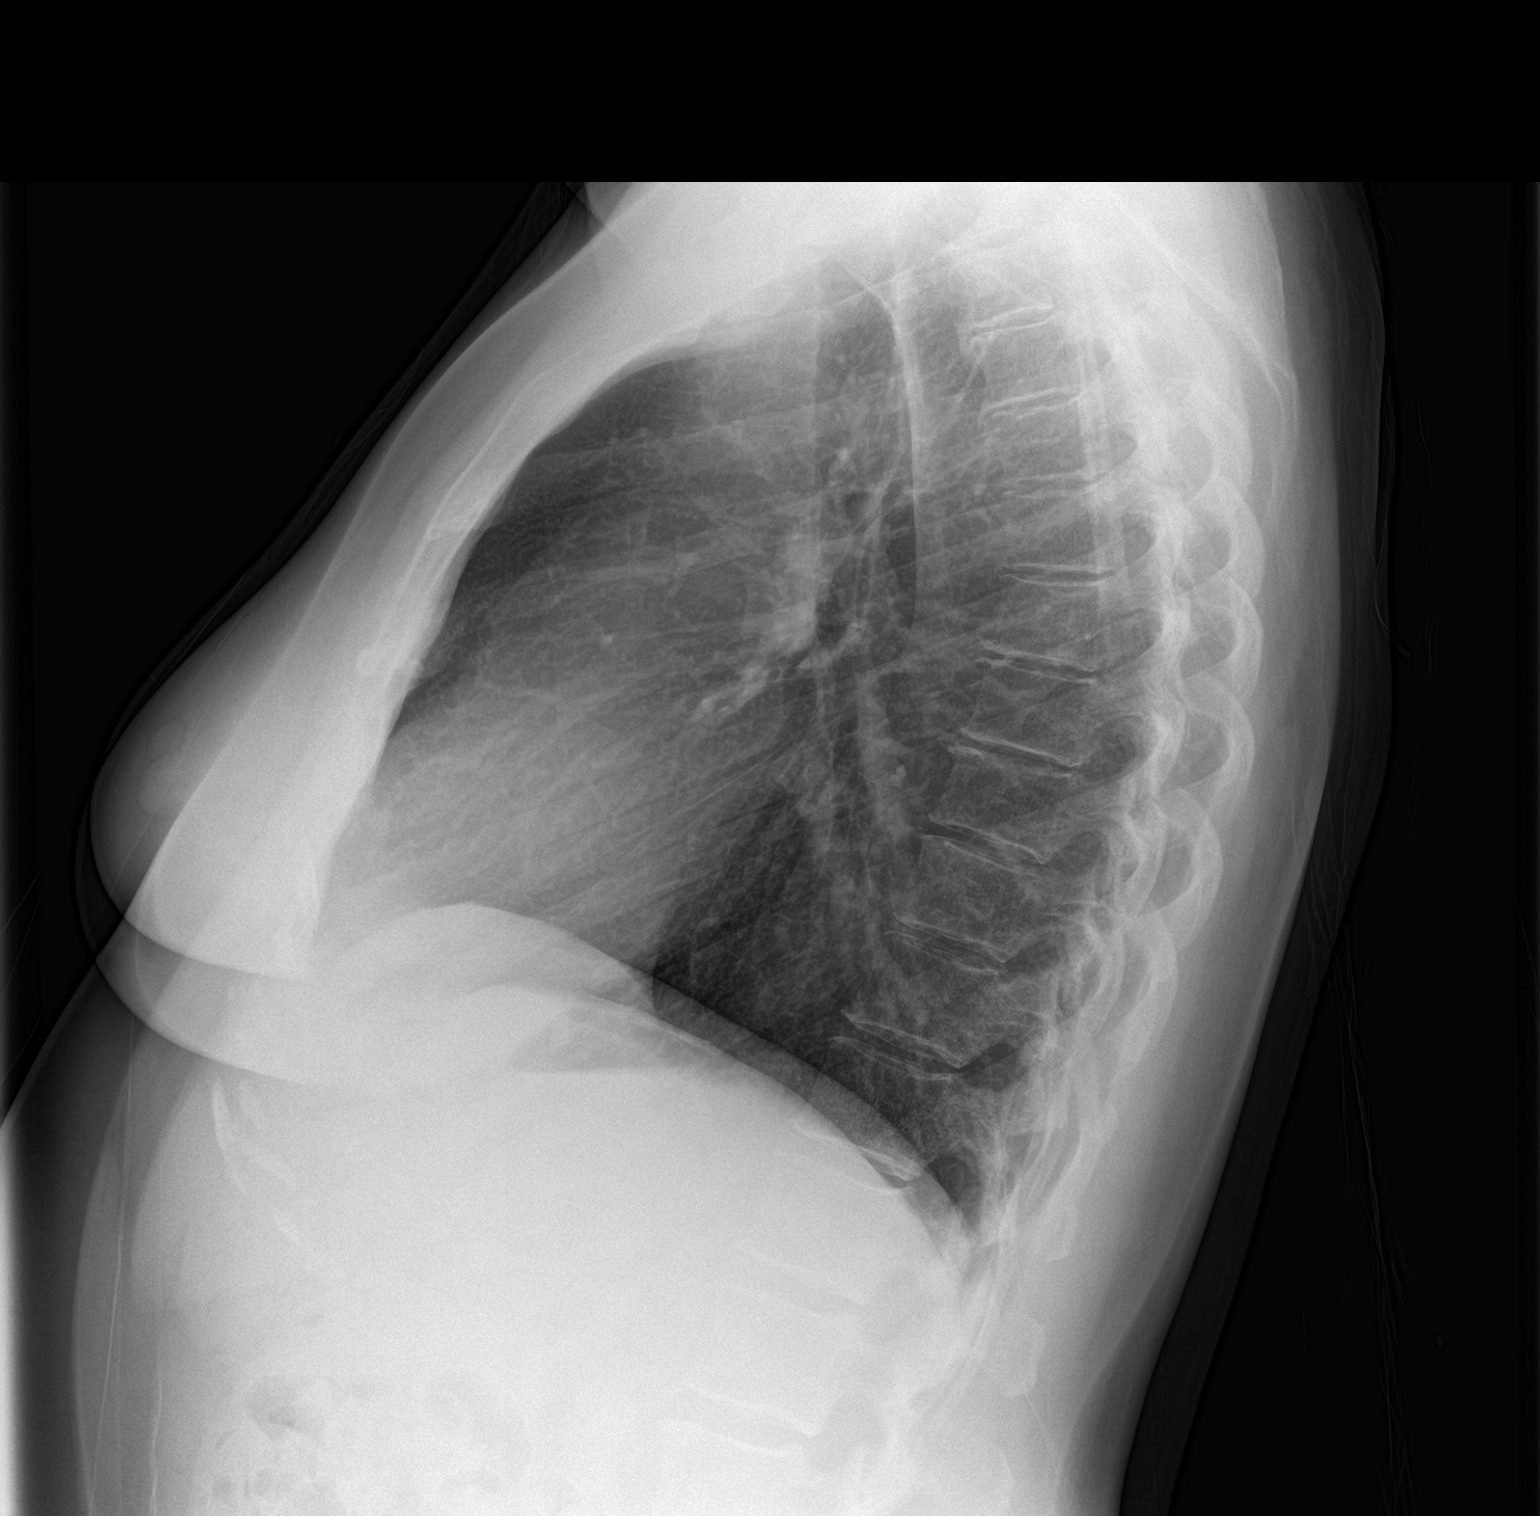

[2 of 2 positions shown; findings below may reference images not displayed]

FINDINGS: The heart size and mediastinal contours are within normal limits.
Both lungs are clear. The visualized skeletal structures are
unremarkable.
IMPRESSION: No active cardiopulmonary disease.

## 2017-10-18 NOTE — Progress Notes (Signed)
Judith Fox  MRN: [id number] DOB: 09/11/1965  Subjective:  HPI  The patient is a 52 year old female who presents for follow up of her pneumonia.  She reports feeling about 75% better.  She is supposed to get her xray repeated today.  She still has some cough and fatigue.  She was able to go on her trip to [place] but did not do any diving just snorkling.    Patient Active Problem List   Diagnosis Date Noted  . Mixed hyperlipidemia 09/05/2017  . Anxiety 06/26/2014  . Syncope and collapse 07/30/2013  . Palpitations 03/11/2013  . Diabetes ([place]) 03/01/2013  . History of non-ST elevation myocardial infarction (NSTEMI) 03/01/2013  . Essential hypertension 03/01/2013  . 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  . Chronic pain syndrome 12/27/2011  . 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 Chronic migraine without aura 10/12/2011  . Migraine without aura 10/12/2011    Past Medical History:  Diagnosis Date  . Allergy   . Diabetes mellitus without complication ([place])   . Hypertension   . Migraine   . Myocardial infarction [facility])     Social History   Socioeconomic History  . Marital status: Married    Spouse name: Not on file  . Number of children: Not on file  . Years of education: Not on file  . Highest education level: Not on file  Social Needs  . Financial resource strain: Not on file  . Food insecurity - worry: Not on file  . Food insecurity - inability: Not on file  . Transportation needs - medical: Not on file  . Transportation needs - non-medical: Not on file  Occupational History  . Not on file  Tobacco Use  . Smoking status: Never Smoker  . Smokeless tobacco: Never Used  Substance and Sexual Activity  . Alcohol use: Yes    Alcohol/week: 1.2 oz    Types: 1 Glasses of wine, 1 Shots of liquor per week    Comment: average varies, possibly once monthly  . Drug use: No  . Sexual activity: Not on file  Other Topics Concern  . Not on file  Social History  Narrative  . Not on file    Outpatient Encounter Medications as of 10/18/2017  Medication Sig  . aspirin EC 81 MG tablet Take 81 mg by mouth daily.  Marland Kitchen atorvastatin (LIPITOR) 40 MG tablet Take 40 mg by mouth daily.  . benazepril (LOTENSIN) 40 MG tablet Take 40 mg by mouth daily.  . diphenhydrAMINE (BENADRYL) 50 MG tablet Take 0.5 tablets (25 mg total) by mouth every 8 (eight) hours as needed (migraine).  . diphenhydrAMINE (BENADRYL) 50 MG/ML injection Inject 50 mg into the vein once.  . docusate sodium (STOOL SOFTENER) 100 MG capsule Take 100 mg by mouth 2 (two) times daily.  Marland Kitchen doxycycline (PERIOSTAT) 20 MG tablet Take 1 tablet (20 mg total) by mouth 2 (two) times daily.  . fexofenadine (ALLEGRA) 180 MG tablet Take 180 mg by mouth daily.  Marland Kitchen glipiZIDE (GLUCOTROL XL) 10 MG 24 hr tablet Take 10 mg by mouth daily.  . indomethacin (INDOCIN) 25 MG capsule TAKE 2 CAPSULES BY MOUTH 3 TIMES A DAY AS NEEDED  . ketorolac (TORADOL) 10 MG tablet Take 1 tablet (10 mg total) by mouth every 8 (eight) hours as needed for moderate pain (with food).  Marland Kitchen ketorolac (TORADOL) 30 MG/ML injection INJ UTD ONCE FOR MIGRAINE  . metFORMIN (GLUCOPHAGE) 1000 MG tablet Take 1 tablet (1,000 mg  total) by mouth 2 (two) times daily with a meal.  . metoCLOPramide (REGLAN) 5 MG/ML injection Inject 10 mg into the vein.  . Multiple Vitamin (MULTIVITAMIN) capsule Take 1 capsule by mouth daily.  . nitroGLYCERIN (NITROSTAT) 0.4 MG SL tablet Place 1 tablet (0.4 mg total) under the tongue every 5 (five) minutes as needed for chest pain.  Marland Kitchen omeprazole (PRILOSEC) 20 MG capsule Take 1 capsule (20 mg total) by mouth 2 (two) times daily before a meal.  . prochlorperazine (COMPAZINE) 10 MG tablet Take 1 tablet (10 mg total) by mouth every 6 (six) hours as needed for nausea or vomiting (migraine).  . promethazine (PHENERGAN) 25 MG tablet Take 1 tablet (25 mg total) by mouth every 6 (six) hours as needed for nausea or vomiting.  . [DISCONTINUED]  azithromycin (ZITHROMAX) 250 MG tablet Take 2 tablets on day 1, then 1 tab each remaining day until gone. (Patient not taking: Reported on 09/19/2017)  . [DISCONTINUED] benzonatate (TESSALON) 100 MG capsule Take 1-2 every 8 hours as needed for cough  . [DISCONTINUED] chlorpheniramine-HYDROcodone (TUSSIONEX PENNKINETIC ER) 10-8 MG/5ML SUER Take 5 mLs by mouth every 12 (twelve) hours as needed for cough. (Patient not taking: Reported on 09/19/2017)  . [DISCONTINUED] HYDROcodone-homatropine (HYCODAN) 5-1.5 MG/5ML syrup Take 5 mLs by mouth every 8 (eight) hours as needed for cough. (Patient not taking: Reported on 09/19/2017)  . [DISCONTINUED] naproxen (NAPROSYN) 500 MG tablet Take 500 mg by mouth 2 (two) times daily.  . [DISCONTINUED] oseltamivir (TAMIFLU) 75 MG capsule Take 1 capsule (75 mg total) by mouth 2 (two) times daily. (Patient not taking: Reported on 09/19/2017)  . [DISCONTINUED] predniSONE (DELTASONE) 20 MG tablet One pill twice daily   No facility-administered encounter medications on file as of 10/18/2017.     Allergies  Allergen Reactions  . Latex Rash and Swelling    Eye swelling   . Butorphanol Other (See Comments)    Other reaction(s): Other (See Comments) drowsiness somnolence   . Butorphanol Tartrate     Other reaction(s): Other (See Comments), Other (See Comments) drowsiness drowsiness somnolence     Review of Systems  Constitutional: Positive for malaise/fatigue. Negative for chills and fever.  Respiratory: Positive for cough, sputum production and wheezing. Negative for hemoptysis and shortness of breath.   Cardiovascular: Negative for chest pain, palpitations, orthopnea, claudication and leg swelling.  Neurological: Positive for weakness.    Objective:  BP (!) 148/82 (BP Location: Right Arm, Patient Position: Sitting, Cuff Size: Normal)   Pulse 80   Temp 98.1 F (36.7 C) (Oral)   Resp 16   Physical Exam  Constitutional: She is oriented to person, place, and  time and well-developed, well-nourished, and in no distress.  HENT:  Head: Normocephalic and atraumatic.  Eyes: Conjunctivae are normal.  Neck: No thyromegaly present.  Cardiovascular: Normal rate, regular rhythm and normal heart sounds.  Pulmonary/Chest: Effort normal and breath sounds normal.  Abdominal: Soft.  Neurological: She is alert and oriented to person, place, and time.  Skin: Skin is warm and dry.  Psychiatric: Mood, memory, affect and judgment normal.    Assessment and Plan :  1. Cough  - DG Chest 2 View; Future  2. Pneumonia of left lower lobe due to infectious organism [facility])  - DG Chest 2 View; Future  I have done the exam and reviewed the chart and it is accurate to the best of my knowledge. Development worker, community has been used and  any errors in dictation or transcription  are unintentional. Miguel Aschoff M.D. [facility]

## 2017-10-18 NOTE — Patient Instructions (Signed)
Take robitussin until the cough has resolved.

## 2017-10-19 ENCOUNTER — Telehealth: Payer: Self-pay | Admitting: Emergency Medicine

## 2017-10-19 NOTE — Telephone Encounter (Signed)
LMTCB

## 2017-10-19 NOTE — Telephone Encounter (Signed)
-----   Message from Jerrol Banana., MD sent at 10/19/2017  1:36 PM EDT ----- Pneumonia cleared.

## 2017-10-20 NOTE — Telephone Encounter (Signed)
Patient is returning a call regarding chest xray

## 2017-10-20 NOTE — Telephone Encounter (Signed)
Advised  ED 

## 2017-11-22 ENCOUNTER — Ambulatory Visit: Payer: Self-pay | Admitting: Family Medicine

## 2017-11-22 ENCOUNTER — Ambulatory Visit (INDEPENDENT_AMBULATORY_CARE_PROVIDER_SITE_OTHER): Payer: BLUE CROSS/BLUE SHIELD | Admitting: Family Medicine

## 2017-11-22 ENCOUNTER — Encounter: Payer: Self-pay | Admitting: Family Medicine

## 2017-11-22 VITALS — BP 142/86 | Temp 98.2°F | Resp 16 | Ht 69.5 in | Wt 189.4 lb

## 2017-11-22 DIAGNOSIS — J0100 Acute maxillary sinusitis, unspecified: Secondary | ICD-10-CM

## 2017-11-22 MED ORDER — CEFDINIR 300 MG PO CAPS: 300.0000 mg | ORAL_CAPSULE | Freq: Two times a day (BID) | ORAL | 0 refills | Status: DC

## 2017-11-22 MED ORDER — HYDROCODONE-HOMATROPINE 5-1.5 MG/5ML PO SYRP: 5.0000 mL | ORAL_SOLUTION | Freq: Three times a day (TID) | ORAL | 0 refills | Status: DC | PRN

## 2017-11-22 NOTE — Progress Notes (Signed)
Patient: Judith Fox Female    DOB: 24-Jun-1966   51 y.o.   MRN: [id number] Visit Date: 11/22/2017  Today's Provider: Vernie Murders, PA   No chief complaint on file.  Subjective:    URI   This is a recurrent (every 3 to 4 years) problem. The current episode started in the past 7 days. The problem has been gradually worsening. There has been no fever (at this time). The fever has been present for 3 to 4 days (When her symptoms first started in [place]). Associated symptoms include a plugged ear sensation, rhinorrhea, sinus pain and wheezing. Pertinent negatives include no abdominal pain, chest pain, diarrhea, dysuria, joint pain, joint swelling, nausea, rash or vomiting. She has tried acetaminophen, NSAIDs, decongestant, antihistamine, increased fluids and sleep for the symptoms. The treatment provided no relief.      Past Medical History:  Diagnosis Date  . Allergy   . Diabetes mellitus without complication ([place])   . Hypertension   . Migraine   . Myocardial infarction [facility])    Past Surgical History:  Procedure Laterality Date  . JOINT REPLACEMENT Right    knee replacement   Family History  Problem Relation Age of Onset  . Migraines Mother   . Cancer Maternal Aunt        lung  . Cancer Maternal Grandmother        breast  . Diabetes Maternal Grandmother   . Cancer Father        died age 59 with metastatic cancer, uncertain origin  . Hypertension Brother    Allergies  Allergen Reactions  . Latex Rash and Swelling    Eye swelling   . Butorphanol Other (See Comments)    Other reaction(s): Other (See Comments) drowsiness somnolence   . Butorphanol Tartrate     Other reaction(s): Other (See Comments), Other (See Comments) drowsiness drowsiness somnolence     Current Outpatient Medications:  .  aspirin EC 81 MG tablet, Take 81 mg by mouth daily., Disp: , Rfl:  .  atorvastatin (LIPITOR) 40 MG tablet, Take 40 mg by mouth daily., Disp: , Rfl: 3 .   benazepril (LOTENSIN) 40 MG tablet, Take 40 mg by mouth daily., Disp: , Rfl: 1 .  diphenhydrAMINE (BENADRYL) 50 MG tablet, Take 0.5 tablets (25 mg total) by mouth every 8 (eight) hours as needed (migraine)., Disp: 15 tablet, Rfl: 0 .  diphenhydrAMINE (BENADRYL) 50 MG/ML injection, Inject 50 mg into the vein once., Disp: , Rfl:  .  docusate sodium (STOOL SOFTENER) 100 MG capsule, Take 100 mg by mouth 2 (two) times daily., Disp: , Rfl:  .  doxycycline (PERIOSTAT) 20 MG tablet, Take 1 tablet (20 mg total) by mouth 2 (two) times daily., Disp: 180 tablet, Rfl: 3 .  fexofenadine (ALLEGRA) 180 MG tablet, Take 180 mg by mouth daily., Disp: , Rfl:  .  glipiZIDE (GLUCOTROL XL) 10 MG 24 hr tablet, Take 10 mg by mouth daily., Disp: , Rfl: 0 .  indomethacin (INDOCIN) 25 MG capsule, TAKE 2 CAPSULES BY MOUTH 3 TIMES A DAY AS NEEDED, Disp: , Rfl: 0 .  ketorolac (TORADOL) 10 MG tablet, Take 1 tablet (10 mg total) by mouth every 8 (eight) hours as needed for moderate pain (with food)., Disp: 15 tablet, Rfl: 0 .  ketorolac (TORADOL) 30 MG/ML injection, INJ UTD ONCE FOR MIGRAINE, Disp: , Rfl: 5 .  metFORMIN (GLUCOPHAGE) 1000 MG tablet, Take 1 tablet (1,000 mg total) by mouth  2 (two) times daily with a meal., Disp: 180 tablet, Rfl: 3 .  metoCLOPramide (REGLAN) 5 MG/ML injection, Inject 10 mg into the vein., Disp: , Rfl:  .  Multiple Vitamin (MULTIVITAMIN) capsule, Take 1 capsule by mouth daily., Disp: , Rfl:  .  nitroGLYCERIN (NITROSTAT) 0.4 MG SL tablet, Place 1 tablet (0.4 mg total) under the tongue every 5 (five) minutes as needed for chest pain., Disp: 50 tablet, Rfl: 3 .  omeprazole (PRILOSEC) 20 MG capsule, Take 1 capsule (20 mg total) by mouth 2 (two) times daily before a meal., Disp: 180 capsule, Rfl: 3 .  prochlorperazine (COMPAZINE) 10 MG tablet, Take 1 tablet (10 mg total) by mouth every 6 (six) hours as needed for nausea or vomiting (migraine)., Disp: 15 tablet, Rfl: 0 .  promethazine (PHENERGAN) 25 MG  tablet, Take 1 tablet (25 mg total) by mouth every 6 (six) hours as needed for nausea or vomiting., Disp: 35 tablet, Rfl: 0  Review of Systems  Constitutional: Positive for appetite change, fatigue and fever.  HENT: Positive for postnasal drip, rhinorrhea, sinus pain and trouble swallowing. Negative for ear discharge, nosebleeds and tinnitus.   Eyes: Positive for discharge.  Respiratory: Positive for chest tightness and wheezing.   Cardiovascular: Negative for chest pain.  Gastrointestinal: Negative.  Negative for abdominal pain, diarrhea, nausea and vomiting.  Genitourinary: Negative for dysuria.  Musculoskeletal: Negative.  Negative for joint pain.  Skin: Negative for rash.    Social History   Tobacco Use  . Smoking status: Never Smoker  . Smokeless tobacco: Never Used  Substance Use Topics  . Alcohol use: Yes    Alcohol/week: 1.2 oz    Types: 1 Glasses of wine, 1 Shots of liquor per week    Comment: average varies, possibly once monthly   Objective:   BP (!) 142/86 (BP Location: Left Arm, Patient Position: Sitting, Cuff Size: Large)   Temp 98.2 F (36.8 C) (Oral)   Resp 16   Ht 5' 9.5" (1.765 m)   Wt 189 lb 6.4 oz (85.9 kg)   SpO2 98%   BMI 27.57 kg/m  Vitals:   11/22/17 1052  BP: (!) 142/86  Resp: 16  Temp: 98.2 F (36.8 C)  TempSrc: Oral  SpO2: 98%  Weight: 189 lb 6.4 oz (85.9 kg)  Height: 5' 9.5" (1.765 m)   Physical Exam  Constitutional: She is oriented to person, place, and time. She appears well-developed and well-nourished. No distress.  HENT:  Head: Normocephalic and atraumatic.  Right Ear: Hearing and external ear normal.  Left Ear: Hearing and external ear normal.  Nose: Nose normal.  Slight cobblestoning of posterior pharynx. Slight tenderness maxillary and ethmoid sinuses with poor transillumination of maxillary sinuses.  Eyes: Conjunctivae and lids are normal. Right eye exhibits no discharge. Left eye exhibits no discharge. No scleral icterus.    Neck: Neck supple.  Cardiovascular: Normal rate and regular rhythm.  Pulmonary/Chest: Effort normal and breath sounds normal. No respiratory distress.  Abdominal: Soft. Bowel sounds are normal.  Musculoskeletal: Normal range of motion.  Neurological: She is alert and oriented to person, place, and time.  Skin: Skin is intact. No lesion and no rash noted.  Psychiatric: She has a normal mood and affect. Her speech is normal and behavior is normal. Thought content normal.      Assessment & Plan:     1. Subacute maxillary sinusitis Initially had congestion a week ago after a trip to [place]. Hacking cough and purulent nasal congestion  has progressed 10-19-17. Poor transillumination of maxillary sinuses without signs of bronchitis or fever. Will treat with Omnicef and Hycodan cough syrup. May use Diflucan she has at home to prevent vaginal candidiasis. Increase fluid intake and recheck prn. - cefdinir (OMNICEF) 300 MG capsule; Take 1 capsule (300 mg total) by mouth 2 (two) times daily.  Dispense: 14 capsule; Refill: 0 - HYDROcodone-homatropine (HYCODAN) 5-1.5 MG/5ML syrup; Take 5 mLs by mouth every 8 (eight) hours as needed for cough.  Dispense: 120 mL; Refill: 0        Vernie Murders, PA  [facility]  HPI, Exam and A&P transcribed by Kris Hartmann under direction and in the presence of [employer], [place].

## 2017-11-29 ENCOUNTER — Ambulatory Visit: Payer: Self-pay | Admitting: Family Medicine

## 2017-12-23 ENCOUNTER — Ambulatory Visit (INDEPENDENT_AMBULATORY_CARE_PROVIDER_SITE_OTHER): Payer: BLUE CROSS/BLUE SHIELD | Admitting: Family Medicine

## 2017-12-23 ENCOUNTER — Encounter: Payer: Self-pay | Admitting: Family Medicine

## 2017-12-23 VITALS — BP 134/92 | HR 102 | Temp 98.3°F | Resp 16 | Wt 192.6 lb

## 2017-12-23 DIAGNOSIS — S39012A Strain of muscle, fascia and tendon of lower back, initial encounter: Secondary | ICD-10-CM

## 2017-12-23 MED ORDER — HYDROCODONE-ACETAMINOPHEN 5-325 MG PO TABS: 1.0000 | ORAL_TABLET | Freq: Four times a day (QID) | ORAL | 0 refills | Status: AC | PRN

## 2017-12-23 NOTE — Progress Notes (Signed)
  Subjective:     Patient ID: Judith Fox, female   DOB: 29-Aug-1965, 52 y.o.   MRN: [id number] Chief Complaint  Patient presents with  . Flank Pain    Patient comes in office today with complaints of left flank pain for the past 2 days that she describes as sharp pain. Paitent states that she has pain when bending or walking, she denies strenuous activity or injury. Patient denies dysuria, pelvic pain, hematuria , frequency or hematuria. Patient has taken otc Tylenol and Ibuprofen.    HPI States she has not had kidney stones or previous hx of back surgery. Does have chronic migraines and has had chiropractic manipulation for this in the past. Accompanied by her s.o.  Review of Systems     Objective:   Physical Exam  Constitutional: She appears well-developed and well-nourished. She appears distressed (moderate pain when changing positions).  Musculoskeletal:  Muscle strength in lower extremities 5/5. SLR on left to 45 degrees without radiation of back pain/right to 90 degrees. Localizes pain to her left lower SI area-minimally tender to the touch.  Unable to produce a urine sample today.     Assessment:    1. Strain of lumbar region, initial encounter: rx of hydrocodone    Plan:    Discussed use of otc medication and warm compresses.

## 2017-12-23 NOTE — Patient Instructions (Addendum)
Discussed use of ibuprofen 800 mg. 3 x day with food and Tylenol up to 3000 mg/day. May use heat for 20 minutes several x day. Let us know if not improving over the next week.

## 2018-03-06 IMAGING — CR DG ABDOMEN 1V
1 series · 1 of 1 positions shown · non-contrast
Comparison: [DATE]

CLINICAL DATA: Rule out kidney stones.  Left lower back pain.

EXAM:
ABDOMEN - 1 VIEW

[dg abd 1 view]
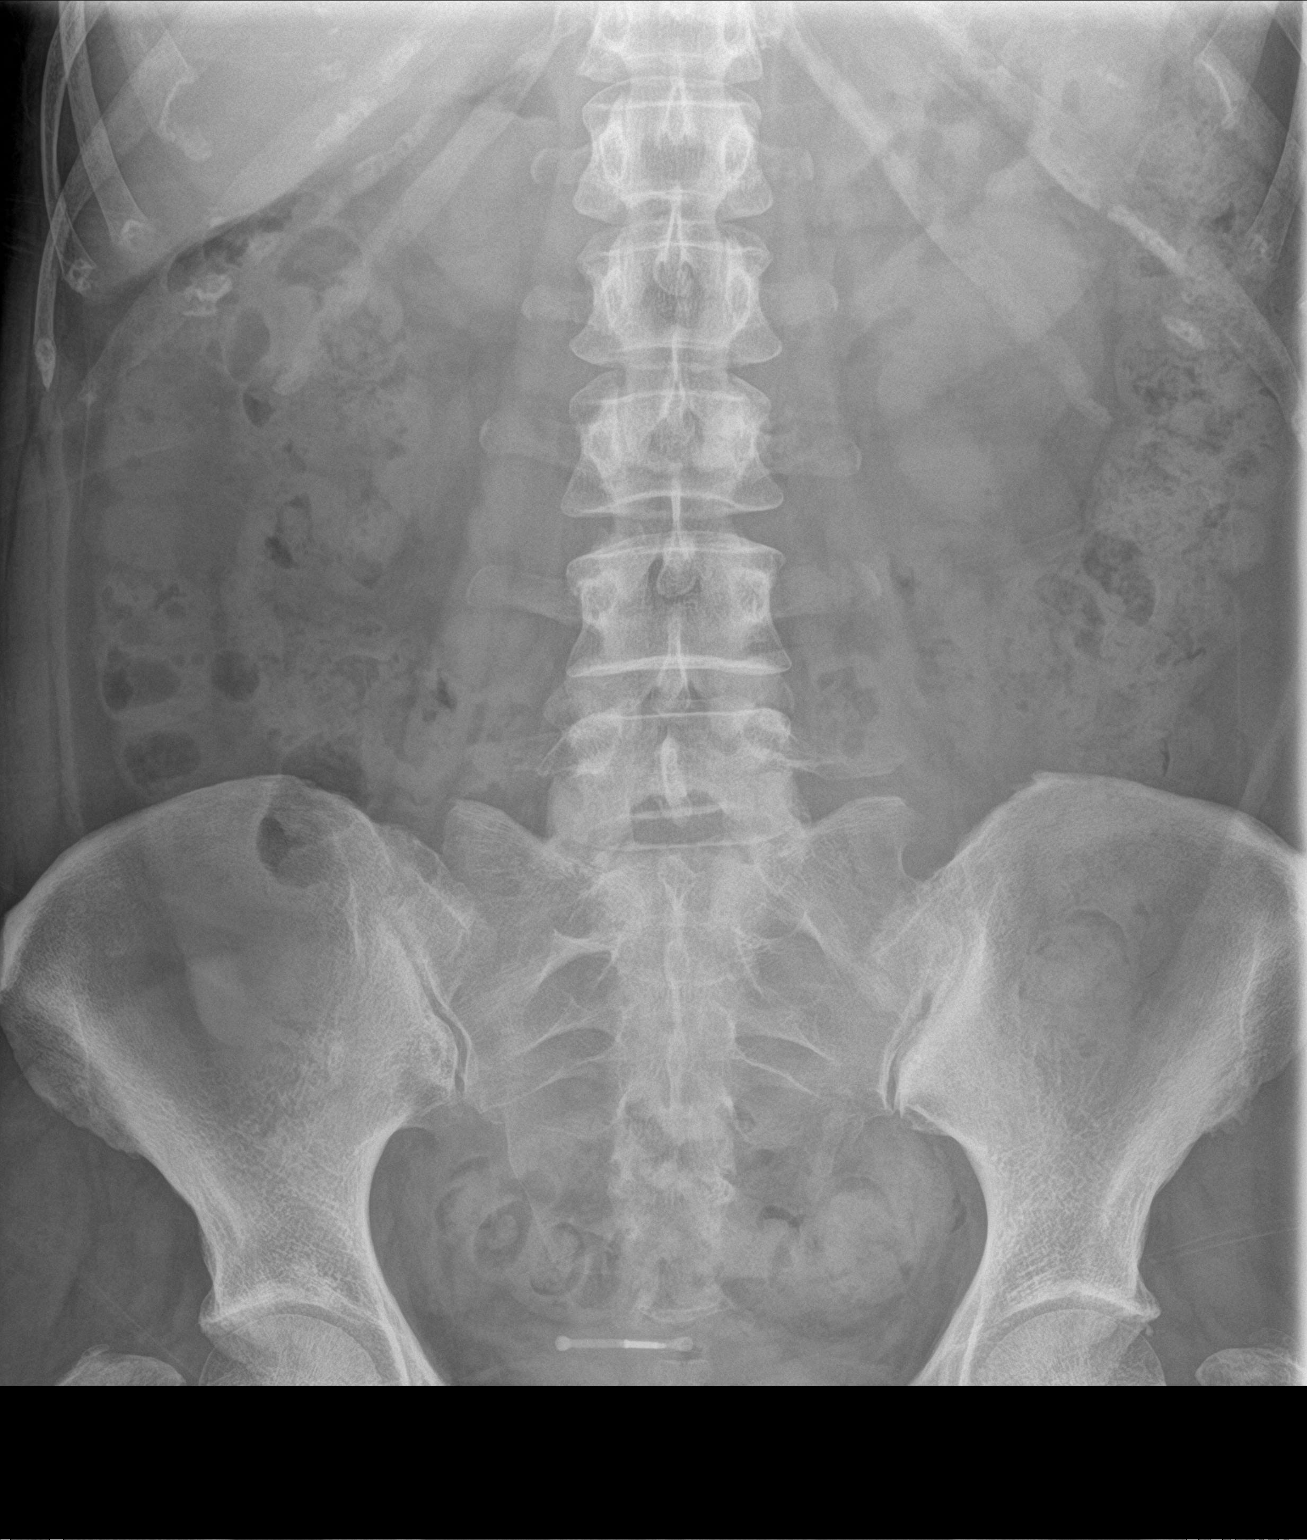

[1 of 1 positions shown; findings below may reference images not displayed]

FINDINGS: The bowel gas pattern is normal. No radio-opaque calculi or other
significant radiographic abnormality are seen. An IUD projects over
the patient's pelvis.
IMPRESSION: Negative.

## 2018-04-29 LAB — COMPREHENSIVE METABOLIC PANEL
ALT: 19 IU/L (ref 0–32)
AST: 22 IU/L (ref 0–40)
Albumin/Globulin Ratio: 2.3 — ABNORMAL HIGH (ref 1.2–2.2)
Albumin: 4.4 g/dL (ref 3.5–5.5)
Alkaline Phosphatase: 70 IU/L (ref 39–117)
BILIRUBIN TOTAL: 0.4 mg/dL (ref 0.0–1.2)
BUN/Creatinine Ratio: 27 — ABNORMAL HIGH (ref 9–23)
BUN: 22 mg/dL (ref 6–24)
CALCIUM: 9.5 mg/dL (ref 8.7–10.2)
CHLORIDE: 101 mmol/L (ref 96–106)
CO2: 22 mmol/L (ref 20–29)
CREATININE: 0.81 mg/dL (ref 0.57–1.00)
GFR, EST AFRICAN AMERICAN: 97 mL/min/{1.73_m2} (ref >59)
GFR, EST NON AFRICAN AMERICAN: 84 mL/min/{1.73_m2} (ref >59)
GLUCOSE: 198 mg/dL — AB (ref 65–99)
Globulin, Total: 1.9 g/dL (ref 1.5–4.5)
Potassium: 4.5 mmol/L (ref 3.5–5.2)
Sodium: 140 mmol/L (ref 134–144)
TOTAL PROTEIN: 6.3 g/dL (ref 6.0–8.5)

## 2018-04-29 LAB — CBC WITH DIFFERENTIAL/PLATELET
BASOS ABS: 0 10*3/uL (ref 0.0–0.2)
Basos: 0 %
EOS (ABSOLUTE): 0.2 10*3/uL (ref 0.0–0.4)
Eos: 2 %
Hematocrit: 40.8 % (ref 34.0–46.6)
Hemoglobin: 13.5 g/dL (ref 11.1–15.9)
IMMATURE GRANS (ABS): 0 10*3/uL (ref 0.0–0.1)
IMMATURE GRANULOCYTES: 0 %
LYMPHS: 29 %
Lymphocytes Absolute: 2.1 10*3/uL (ref 0.7–3.1)
MCH: 28.8 pg (ref 26.6–33.0)
MCHC: 33.1 g/dL (ref 31.5–35.7)
MCV: 87 fL (ref 79–97)
MONOCYTES: 7 %
Monocytes Absolute: 0.5 10*3/uL (ref 0.1–0.9)
NEUTROS PCT: 62 %
Neutrophils Absolute: 4.6 10*3/uL (ref 1.4–7.0)
PLATELETS: 227 10*3/uL (ref 150–450)
RBC: 4.69 x10E6/uL (ref 3.77–5.28)
RDW: 14 % (ref 12.3–15.4)
WBC: 7.4 10*3/uL (ref 3.4–10.8)

## 2018-04-29 LAB — LIPID PANEL WITH LDL/HDL RATIO
Cholesterol, Total: 94 mg/dL — ABNORMAL LOW (ref 100–199)
HDL: 47 mg/dL (ref >39)
LDL Calculated: 32 mg/dL (ref 0–99)
LDL/HDL RATIO: 0.7 ratio (ref 0.0–3.2)
Triglycerides: 75 mg/dL (ref 0–149)
VLDL CHOLESTEROL CAL: 15 mg/dL (ref 5–40)

## 2018-04-29 LAB — TSH: TSH: 1.61 u[IU]/mL (ref 0.450–4.500)

## 2018-04-29 LAB — HEMOGLOBIN A1C
Est. average glucose Bld gHb Est-mCnc: 169 mg/dL
Hgb A1c MFr Bld: 7.5 % — ABNORMAL HIGH (ref 4.8–5.6)

## 2018-05-01 ENCOUNTER — Ambulatory Visit (INDEPENDENT_AMBULATORY_CARE_PROVIDER_SITE_OTHER): Payer: BLUE CROSS/BLUE SHIELD | Admitting: Family Medicine

## 2018-05-01 VITALS — BP 150/90 | HR 93 | Temp 97.7°F | Resp 16 | Wt 197.0 lb

## 2018-05-01 DIAGNOSIS — I8393 Asymptomatic varicose veins of bilateral lower extremities: Secondary | ICD-10-CM | POA: Diagnosis not present

## 2018-05-01 DIAGNOSIS — I10 Essential (primary) hypertension: Secondary | ICD-10-CM

## 2018-05-01 DIAGNOSIS — G43709 Chronic migraine without aura, not intractable, without status migrainosus: Secondary | ICD-10-CM | POA: Diagnosis not present

## 2018-05-01 DIAGNOSIS — E782 Mixed hyperlipidemia: Secondary | ICD-10-CM

## 2018-05-01 DIAGNOSIS — E119 Type 2 diabetes mellitus without complications: Secondary | ICD-10-CM | POA: Diagnosis not present

## 2018-05-01 NOTE — Progress Notes (Signed)
Judith Fox  MRN: [id number] DOB: 07/04/1966  Subjective:  HPI   The patient is a 52 year old female who presents for follow up of diabetes.  She was last seen on 12/23/17.  She had her last A1C doen earlier today and it was 7.5. The patient states she does not check her glucose regularly but can tell when it is up.  She states she has been on several vacations recently and has been traveling and eating out more than normal due to work.    Patient would like to discuss a referral to the vein and vascular clinic.  She states that her previous doctor had done one prior to her coming here and she never did get to go.  She states her legs are tired and sore most of the time.  Patient Active Problem List   Diagnosis Date Noted  . Mixed hyperlipidemia 09/05/2017  . Anxiety 06/26/2014  . Syncope and collapse 07/30/2013  . Palpitations 03/11/2013  . Diabetes ([place]) 03/01/2013  . History of non-ST elevation myocardial infarction (NSTEMI) 03/01/2013  . Essential hypertension 03/01/2013  . 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  . Chronic pain syndrome 12/27/2011  . 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 Chronic migraine without aura 10/12/2011  . Migraine without aura 10/12/2011    Past Medical History:  Diagnosis Date  . Allergy   . Diabetes mellitus without complication ([place])   . Hypertension   . Migraine   . Myocardial infarction [facility])     Social History   Socioeconomic History  . Marital status: Married    Spouse name: Not on file  . Number of children: Not on file  . Years of education: Not on file  . Highest education level: Not on file  Occupational History  . Not on file  Social Needs  . Financial resource strain: Not on file  . Food insecurity:    Worry: Not on file    Inability: Not on file  . Transportation needs:    Medical: Not on file    Non-medical: Not on file  Tobacco Use  . Smoking status: Never Smoker  . Smokeless tobacco: Never Used    Substance and Sexual Activity  . Alcohol use: Yes    Alcohol/week: 2.0 standard drinks    Types: 1 Glasses of wine, 1 Shots of liquor per week    Comment: average varies, possibly once monthly  . Drug use: No  . Sexual activity: Not on file  Lifestyle  . Physical activity:    Days per week: Not on file    Minutes per session: Not on file  . Stress: Not on file  Relationships  . Social connections:    Talks on phone: Not on file    Gets together: Not on file    Attends religious service: Not on file    Active member of club or organization: Not on file    Attends meetings of clubs or organizations: Not on file    Relationship status: Not on file  . Intimate partner violence:    Fear of current or ex partner: Not on file    Emotionally abused: Not on file    Physically abused: Not on file    Forced sexual activity: Not on file  Other Topics Concern  . Not on file  Social History Narrative  . Not on file    Outpatient Encounter Medications as of 05/01/2018  Medication Sig  . aspirin EC 81 MG  tablet Take 81 mg by mouth daily.  Marland Kitchen atorvastatin (LIPITOR) 40 MG tablet Take 40 mg by mouth daily.  . benazepril (LOTENSIN) 40 MG tablet Take 40 mg by mouth daily.  . diphenhydrAMINE (BENADRYL) 50 MG tablet Take 0.5 tablets (25 mg total) by mouth every 8 (eight) hours as needed (migraine).  . diphenhydrAMINE (BENADRYL) 50 MG/ML injection Inject 50 mg into the vein once.  . docusate sodium (STOOL SOFTENER) 100 MG capsule Take 100 mg by mouth 2 (two) times daily.  Marland Kitchen doxycycline (PERIOSTAT) 20 MG tablet Take 1 tablet (20 mg total) by mouth 2 (two) times daily.  . fexofenadine (ALLEGRA) 180 MG tablet Take 180 mg by mouth daily.  . fluconazole (DIFLUCAN) 100 MG tablet TAKE 1 TABLET BY MOUTH ONCE WEEKLY AND AS NEEDED AS DIRECTED BY PRESCRIBER  . glipiZIDE (GLUCOTROL XL) 10 MG 24 hr tablet Take 10 mg by mouth daily.  Marland Kitchen ketorolac (TORADOL) 10 MG tablet Take 1 tablet (10 mg total) by mouth every  8 (eight) hours as needed for moderate pain (with food).  Marland Kitchen ketorolac (TORADOL) 30 MG/ML injection INJ UTD ONCE FOR MIGRAINE  . loratadine (CLARITIN) 10 MG tablet Take by mouth as needed.  . metFORMIN (GLUCOPHAGE) 1000 MG tablet Take 1 tablet (1,000 mg total) by mouth 2 (two) times daily with a meal.  . metoCLOPramide (REGLAN) 5 MG/ML injection Inject 10 mg into the vein.  . Multiple Vitamin (MULTIVITAMIN) capsule Take 1 capsule by mouth daily.  . naproxen (NAPROSYN) 500 MG tablet TAKE 1 TABLET (500 MG TOTAL) BY MOUTH 2 (TWO) TIMES DAILY WITH A MEAL.  . nitroGLYCERIN (NITROSTAT) 0.4 MG SL tablet Place 1 tablet (0.4 mg total) under the tongue every 5 (five) minutes as needed for chest pain.  Marland Kitchen omeprazole (PRILOSEC) 20 MG capsule Take 1 capsule (20 mg total) by mouth 2 (two) times daily before a meal.  . ondansetron (ZOFRAN) 8 MG tablet Take 8 mg by mouth every 8 (eight) hours as needed. for nausea  . prochlorperazine (COMPAZINE) 10 MG tablet Take 1 tablet (10 mg total) by mouth every 6 (six) hours as needed for nausea or vomiting (migraine).  . promethazine (PHENERGAN) 25 MG tablet Take 1 tablet (25 mg total) by mouth every 6 (six) hours as needed for nausea or vomiting.  . [DISCONTINUED] indomethacin (INDOCIN) 25 MG capsule TAKE 2 CAPSULES BY MOUTH 3 TIMES A DAY AS NEEDED  . [DISCONTINUED] metoprolol succinate (TOPROL-XL) 25 MG 24 hr tablet Take by mouth.   No facility-administered encounter medications on file as of 05/01/2018.     Allergies  Allergen Reactions  . Latex Rash and Swelling    Eye swelling   . Butorphanol Other (See Comments)    Other reaction(s): Other (See Comments) drowsiness somnolence   . Butorphanol Tartrate     Other reaction(s): Other (See Comments), Other (See Comments) drowsiness drowsiness somnolence   . Lactose Intolerance (Gi) Other (See Comments)    Reflux and Indigestion    Review of Systems  Constitutional: Positive for malaise/fatigue. Negative  for fever.  Eyes: Negative.   Respiratory: Negative for cough, shortness of breath and wheezing.   Cardiovascular: Positive for claudication. Negative for chest pain, palpitations, orthopnea and leg swelling.  Gastrointestinal: Negative.   Skin: Negative.   Endo/Heme/Allergies: Negative.   Psychiatric/Behavioral: Negative.     Objective:  BP (!) 150/90 (BP Location: Right Arm, Patient Position: Sitting, Cuff Size: Normal)   Pulse 93   Temp 97.7 F (36.5 C) (Oral)  Resp 16   Wt 197 lb (89.4 kg)   BMI 28.67 kg/m   Physical Exam  Constitutional: She is oriented to person, place, and time and well-developed, well-nourished, and in no distress.  HENT:  Head: Normocephalic and atraumatic.  Right Ear: External ear normal.  Left Ear: External ear normal.  Cardiovascular: Normal rate, regular rhythm and normal heart sounds.  Pulmonary/Chest: Effort normal and breath sounds normal.  Abdominal: Soft.  Musculoskeletal: She exhibits no edema.  Neurological: She is alert and oriented to person, place, and time. Gait normal. GCS score is 15.  Skin: Skin is warm and dry.  Psychiatric: Mood, memory, affect and judgment normal.    Assessment and Plan :  1. Varicose veins of both lower extremities, unspecified whether complicated  - Ambulatory referral to Vascular Surgery 1. Varicose veins of both lower extremities, unspecified whether complicated  - Ambulatory referral to Vascular Surgery  2. Essential hypertension   3. Chronic migraine without aura without status migrainosus, not intractable   4. Type 2 diabetes mellitus without complication, without long-term current use of insulin (HCC) A1C is 7.5  today. Good control. 5.HLD On Lipitor.  I have done the exam and reviewed the chart and it is accurate to the best of my knowledge. Development worker, community has been used and  any errors in dictation or transcription are unintentional. Miguel Aschoff M.D. [facility]

## 2018-05-02 ENCOUNTER — Telehealth: Payer: Self-pay

## 2018-05-02 NOTE — Telephone Encounter (Signed)
-----   Message from Jerrol Banana., MD sent at 05/02/2018 10:21 AM EDT ----- stable

## 2018-05-02 NOTE — Telephone Encounter (Signed)
Advised  ED  ----- Message from Jerrol Banana., MD sent at 05/02/2018 10:21 AM EDT ----- stable

## 2018-05-16 ENCOUNTER — Ambulatory Visit (INDEPENDENT_AMBULATORY_CARE_PROVIDER_SITE_OTHER): Payer: BLUE CROSS/BLUE SHIELD | Admitting: Vascular Surgery

## 2018-05-16 ENCOUNTER — Encounter (INDEPENDENT_AMBULATORY_CARE_PROVIDER_SITE_OTHER): Payer: Self-pay | Admitting: Vascular Surgery

## 2018-05-16 VITALS — BP 144/88 | HR 97 | Resp 17 | Ht 69.5 in | Wt 196.0 lb

## 2018-05-16 DIAGNOSIS — E119 Type 2 diabetes mellitus without complications: Secondary | ICD-10-CM | POA: Diagnosis not present

## 2018-05-16 DIAGNOSIS — I83813 Varicose veins of bilateral lower extremities with pain: Secondary | ICD-10-CM | POA: Diagnosis not present

## 2018-05-16 DIAGNOSIS — R600 Localized edema: Secondary | ICD-10-CM

## 2018-05-16 DIAGNOSIS — I10 Essential (primary) hypertension: Secondary | ICD-10-CM | POA: Diagnosis not present

## 2018-05-16 LAB — HM DIABETES EYE EXAM

## 2018-05-16 NOTE — Progress Notes (Signed)
Subjective:    Patient ID: Judith Fox, female    DOB: [birth year], 52 y.o.   MRN: [id number] Chief Complaint  Patient presents with  . New Patient (Initial Visit)    Varicose Veins   Presents as a new patient referred by [facility] family medical practice for evaluation of painful varicose veins and lower extremity edema.  The patient notes a long-standing history of varicosities located to the bilateral legs.  They have always been "painful" however she has noticed a progressive worsening in her discomfort over the last few years.  The patient also has noticed an increase in size and number.  The patient experiences intermittent bilateral lower extremity edema.  This is also associated with some discomfort.  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  The patient notes that she has been engaging conservative therapy including wearing medical grade 1 compression socks, elevating her legs and remaining active on a daily basis for many years.  She notes that this does not provide any improvement to her discomfort which runs along her varicosities and the intermittent bilateral lower extremity edema she experiences.  The patient feels that her symptoms have progressed to the point that she is unable to function on a daily basis.  Patient feels that her symptoms have become lifestyle limiting.  The patient is status post a partial right knee replacement.  The patient denies any recent surgery or trauma to the bilateral legs.  Patient denies any DVT history.  Patient denies any claudication-like symptoms, rest pain or ulcer formation to the bilateral lower extremity. Patient denies any recent or recurrent bouts of cellulitis.  The patient denies any fever, nausea vomiting.  Review of Systems  Constitutional: Negative.   HENT: Negative.   Eyes: Negative.   Respiratory: Negative.   Cardiovascular: Positive for leg swelling.   Painful varicose veins  Gastrointestinal: Negative.   Endocrine: Negative.   Genitourinary: Negative.   Musculoskeletal: Negative.   Skin: Negative.   Allergic/Immunologic: Negative.   Neurological: Negative.   Hematological: Negative.   Psychiatric/Behavioral: Negative.       Objective:   Physical Exam  Constitutional: She is oriented to person, place, and time. She appears well-developed and well-nourished. No distress.  HENT:  Head: Normocephalic and atraumatic.  Right Ear: External ear normal.  Left Ear: External ear normal.  Eyes: Pupils are equal, round, and reactive to light. Conjunctivae and EOM are normal.  Neck: Normal range of motion.  Cardiovascular: Normal rate, regular rhythm, normal heart sounds and intact distal pulses.  Pulses:      Radial pulses are 2+ on the right side, and 2+ on the left side.       Dorsalis pedis pulses are 2+ on the right side, and 2+ on the left side.       Posterior tibial pulses are 2+ on the right side, and 2+ on the left side.  Pulmonary/Chest: Effort normal and breath sounds normal.  Musculoskeletal: Normal range of motion. She exhibits edema (Mild nonpitting edema noted bilaterally).  Neurological: She is alert and oriented to person, place, and time.  Skin: Skin is warm and dry. She is not diaphoretic.  Large greater than 1 cm varicosities tracking down the medial aspect of the bilateral legs.  There is no stasis dermatitis, fibrosis, cellulitis or active ulcerations noted at this time.  Psychiatric: She has a normal mood and affect. Her behavior is normal. Judgment and  thought content normal.  Vitals reviewed.  BP (!) 144/88 (BP Location: Right Arm, Patient Position: Sitting)   Pulse 97   Resp 17   Ht 5' 9.5" (1.765 m)   Wt 196 lb (88.9 kg)   BMI 28.53 kg/m   Past Medical History:  Diagnosis Date  . Allergy   . Diabetes mellitus without complication ([place])   . Hypertension   . Migraine   . Myocardial infarction [facility])     Social History   Socioeconomic History  . Marital status: Married    Spouse name: Not on file  . Number of children: Not on file  . Years of education: Not on file  . Highest education level: Not on file  Occupational History  . Not on file  Social Needs  . Financial resource strain: Not on file  . Food insecurity:    Worry: Not on file    Inability: Not on file  . Transportation needs:    Medical: Not on file    Non-medical: Not on file  Tobacco Use  . Smoking status: Never Smoker  . Smokeless tobacco: Never Used  Substance and Sexual Activity  . Alcohol use: Yes    Alcohol/week: 2.0 standard drinks    Types: 1 Glasses of wine, 1 Shots of liquor per week    Comment: average varies, possibly once monthly  . Drug use: No  . Sexual activity: Not on file  Lifestyle  . Physical activity:    Days per week: Not on file    Minutes per session: Not on file  . Stress: Not on file  Relationships  . Social connections:    Talks on phone: Not on file    Gets together: Not on file    Attends religious service: Not on file    Active member of club or organization: Not on file    Attends meetings of clubs or organizations: Not on file    Relationship status: Not on file  . Intimate partner violence:    Fear of current or ex partner: Not on file    Emotionally abused: Not on file    Physically abused: Not on file    Forced sexual activity: Not on file  Other Topics Concern  . Not on file  Social History Narrative  . Not on file   Past Surgical History:  Procedure Laterality Date  . JOINT REPLACEMENT Right    knee replacement   Family History  Problem Relation Age of Onset  . Migraines Mother   . Cancer Maternal Aunt        lung  . Cancer Maternal Grandmother        breast  . Diabetes Maternal Grandmother   . Cancer Father        died age 41 with metastatic cancer, uncertain origin  . Hypertension Brother    Allergies  Allergen Reactions  . Latex Rash and  Swelling    Eye swelling   . Butorphanol Other (See Comments)    Other reaction(s): Other (See Comments) drowsiness somnolence   . Butorphanol Tartrate     Other reaction(s): Other (See Comments), Other (See Comments) drowsiness drowsiness somnolence   . Lactose Intolerance (Gi) Other (See Comments)    Reflux and Indigestion      Assessment & Plan:  Presents as a new patient referred by [facility] family medical practice for evaluation of painful varicose veins and lower extremity edema.  The patient notes a long-standing history of varicosities located  to the bilateral legs.  They have always been "painful" however she has noticed a progressive worsening in her discomfort over the last few years.  The patient also has noticed an increase in size and number.  The patient experiences intermittent bilateral lower extremity edema.  This is also associated with some discomfort.  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  The patient notes that she has been engaging conservative therapy including wearing medical grade 1 compression socks, elevating her legs and remaining active on a daily basis for many years.  She notes that this does not provide any improvement to her discomfort which runs along her varicosities and the intermittent bilateral lower extremity edema she experiences.  The patient feels that her symptoms have progressed to the point that she is unable to function on a daily basis.  Patient feels that her symptoms have become lifestyle limiting.  The patient is status post a partial right knee replacement.  The patient denies any recent surgery or trauma to the bilateral legs.  Patient denies any DVT history.  Patient denies any claudication-like symptoms, rest pain or ulcer formation to the bilateral lower extremity. Patient denies any recent or recurrent bouts of cellulitis.  The patient denies any fever, nausea  vomiting.  1. Varicose veins of both lower extremities with pain - New The patient was encouraged to continue wearing graduated compression stockings (20-30 mmHg) on a daily basis. The patient was instructed to begin wearing the stockings first thing in the morning and removing them in the evening. The patient was instructed specifically not to sleep in the stockings. Prescription given. In addition, behavioral modification including elevation during the day will be continued. Anti-inflammatories for pain. I will bring the patient back at her convenience to undergo bilateral lower extremity venous duplex to rule out any contributing venous versus lymphatic disease. The patient will follow up in one month to asses conservative management.  Information on compression stockings was given to the patient. The patient was instructed to call the office in the interim if any worsening edema or ulcerations to the legs, feet or toes occurs. The patient expresses their understanding.  - VAS [place] LOWER EXTREMITY VENOUS REFLUX; Future  2. Bilateral lower extremity edema - New As above  - VAS [place] LOWER EXTREMITY VENOUS REFLUX; Future  3. Essential hypertension - Stable Encouraged good control as its slows the progression of atherosclerotic disease  4. Type 2 diabetes mellitus without complication, without long-term current use of insulin (HCC) - Stable Encouraged good control as its slows the progression of atherosclerotic disease  Current Outpatient Medications on File Prior to Visit  Medication Sig Dispense Refill  . aspirin EC 81 MG tablet Take 81 mg by mouth daily.    Marland Kitchen atorvastatin (LIPITOR) 40 MG tablet Take 40 mg by mouth daily.  3  . benazepril (LOTENSIN) 40 MG tablet Take 40 mg by mouth daily.  1  . diphenhydrAMINE (BENADRYL) 50 MG tablet Take 0.5 tablets (25 mg total) by mouth every 8 (eight) hours as needed (migraine). 15 tablet 0  . diphenhydrAMINE (BENADRYL) 50 MG/ML injection Inject 50 mg  into the vein once.    . docusate sodium (STOOL SOFTENER) 100 MG capsule Take 100 mg by mouth 2 (two) times daily.    Marland Kitchen doxycycline (PERIOSTAT) 20 MG tablet Take 1 tablet (20 mg total) by mouth 2 (two) times daily. 180 tablet 3  . fexofenadine (ALLEGRA) 180  MG tablet Take 180 mg by mouth daily.    . fluconazole (DIFLUCAN) 100 MG tablet TAKE 1 TABLET BY MOUTH ONCE WEEKLY AND AS NEEDED AS DIRECTED BY PRESCRIBER  3  . glipiZIDE (GLUCOTROL XL) 10 MG 24 hr tablet Take 10 mg by mouth daily.  0  . ketorolac (TORADOL) 10 MG tablet Take 1 tablet (10 mg total) by mouth every 8 (eight) hours as needed for moderate pain (with food). 15 tablet 0  . ketorolac (TORADOL) 30 MG/ML injection INJ UTD ONCE FOR MIGRAINE  5  . loratadine (CLARITIN) 10 MG tablet Take by mouth as needed.    . metFORMIN (GLUCOPHAGE) 1000 MG tablet Take 1 tablet (1,000 mg total) by mouth 2 (two) times daily with a meal. 180 tablet 3  . metoCLOPramide (REGLAN) 5 MG/ML injection Inject 10 mg into the vein.    . Multiple Vitamin (MULTIVITAMIN) capsule Take 1 capsule by mouth daily.    . naproxen (NAPROSYN) 500 MG tablet TAKE 1 TABLET (500 MG TOTAL) BY MOUTH 2 (TWO) TIMES DAILY WITH A MEAL.  3  . nitroGLYCERIN (NITROSTAT) 0.4 MG SL tablet Place 1 tablet (0.4 mg total) under the tongue every 5 (five) minutes as needed for chest pain. 50 tablet 3  . omeprazole (PRILOSEC) 20 MG capsule Take 1 capsule (20 mg total) by mouth 2 (two) times daily before a meal. 180 capsule 3  . ondansetron (ZOFRAN) 8 MG tablet Take 8 mg by mouth every 8 (eight) hours as needed. for nausea  5  . prochlorperazine (COMPAZINE) 10 MG tablet Take 1 tablet (10 mg total) by mouth every 6 (six) hours as needed for nausea or vomiting (migraine). 15 tablet 0  . promethazine (PHENERGAN) 25 MG tablet Take 1 tablet (25 mg total) by mouth every 6 (six) hours as needed for nausea or vomiting. 35 tablet 0   No current facility-administered medications on file prior to visit.     There are no Patient Instructions on file for this visit. No follow-ups on file.  Aadvik Roker A Harla Mensch, PA-C

## 2018-05-17 ENCOUNTER — Ambulatory Visit: Payer: BLUE CROSS/BLUE SHIELD

## 2018-06-08 ENCOUNTER — Ambulatory Visit (INDEPENDENT_AMBULATORY_CARE_PROVIDER_SITE_OTHER): Payer: BLUE CROSS/BLUE SHIELD | Admitting: Nurse Practitioner

## 2018-06-08 ENCOUNTER — Encounter (INDEPENDENT_AMBULATORY_CARE_PROVIDER_SITE_OTHER): Payer: BLUE CROSS/BLUE SHIELD

## 2018-07-05 ENCOUNTER — Other Ambulatory Visit: Payer: Self-pay | Admitting: Family Medicine

## 2018-07-05 DIAGNOSIS — E119 Type 2 diabetes mellitus without complications: Secondary | ICD-10-CM

## 2018-07-10 ENCOUNTER — Other Ambulatory Visit: Payer: Self-pay | Admitting: Obstetrics and Gynecology

## 2018-07-10 DIAGNOSIS — Z1231 Encounter for screening mammogram for malignant neoplasm of breast: Secondary | ICD-10-CM

## 2018-07-26 ENCOUNTER — Ambulatory Visit
Admission: RE | Admit: 2018-07-26 | Discharge: 2018-07-26 | Disposition: A | Discharge status: Home / Self Care | Payer: BLUE CROSS/BLUE SHIELD | Source: Ambulatory Visit | Attending: Obstetrics and Gynecology | Admitting: Obstetrics and Gynecology

## 2018-07-26 DIAGNOSIS — Z1231 Encounter for screening mammogram for malignant neoplasm of breast: Secondary | ICD-10-CM | POA: Insufficient documentation

## 2018-07-26 IMAGING — MG DIGITAL SCREENING BILATERAL MAMMOGRAM WITH TOMO AND CAD
8 series · 8 of 24 positions shown · non-contrast
Comparison: Previous exam(s).

CLINICAL DATA: Screening.

EXAM:
DIGITAL SCREENING BILATERAL MAMMOGRAM WITH TOMO AND CAD

[R CC synth-2D]
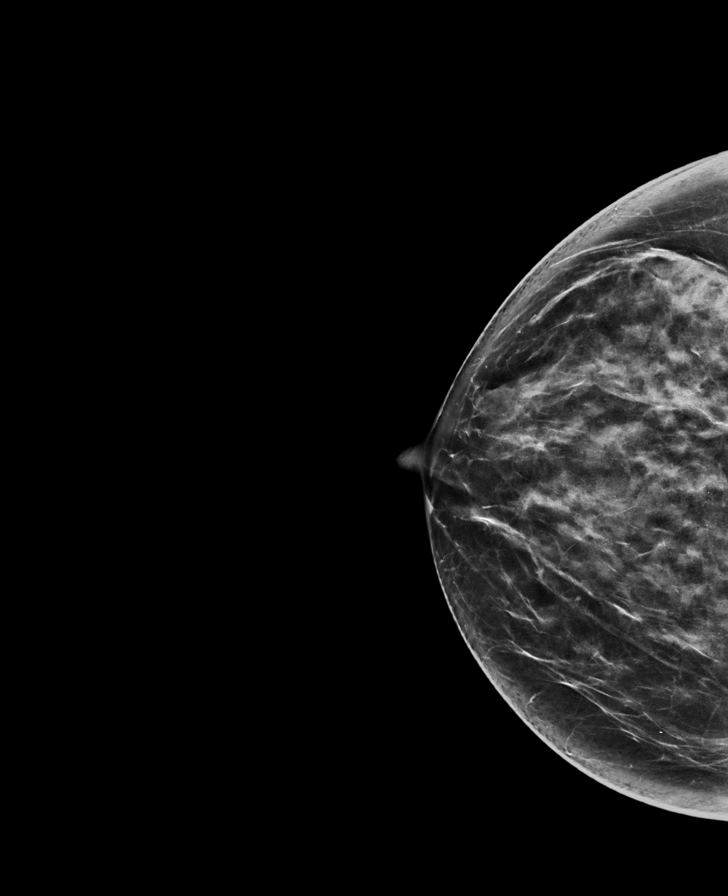

[L CC synth-2D]
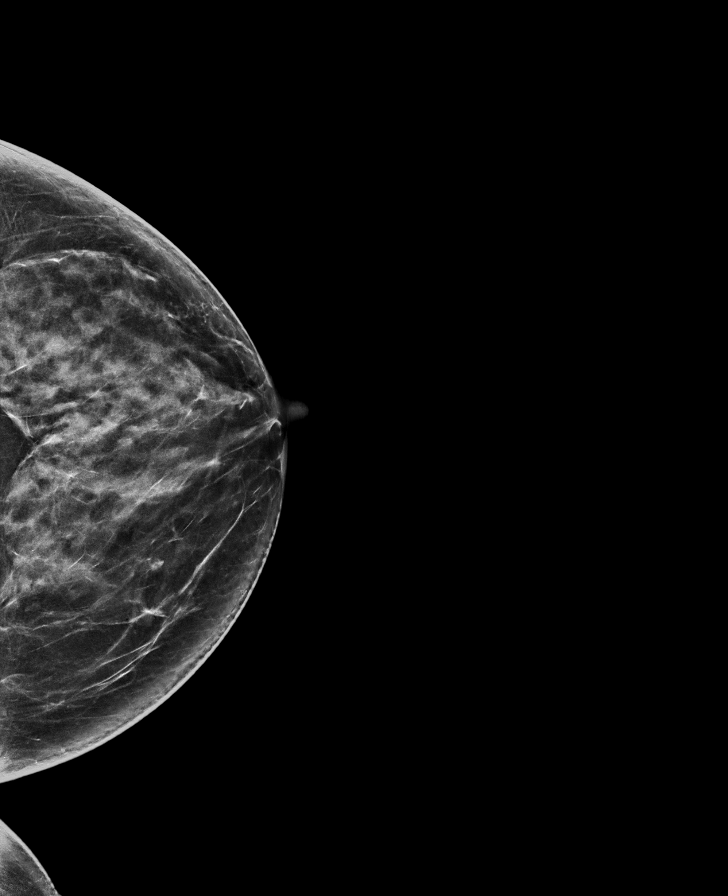

[R MLO synth-2D]
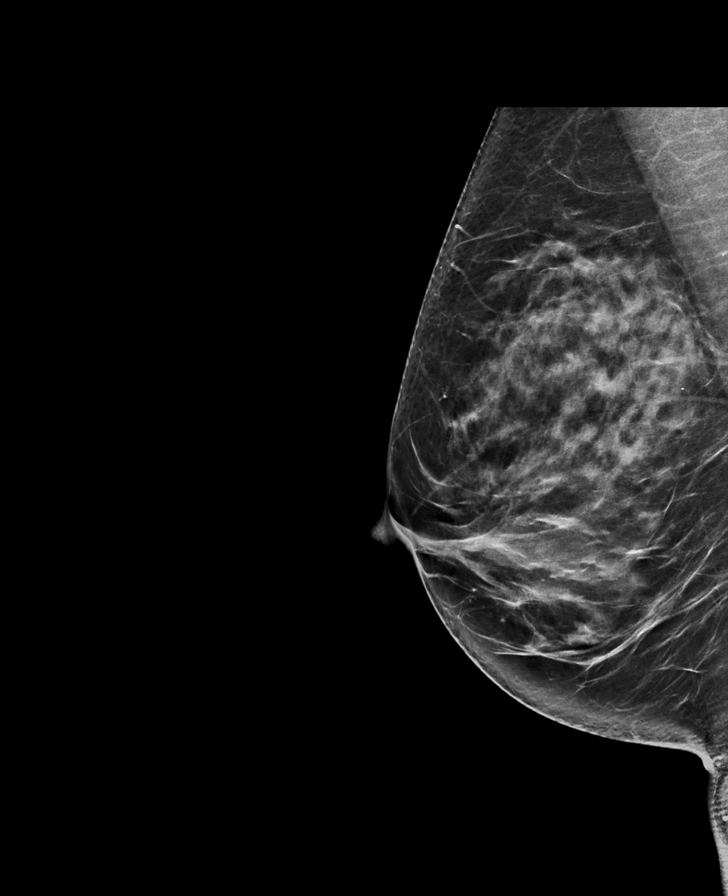

[L MLO synth-2D]
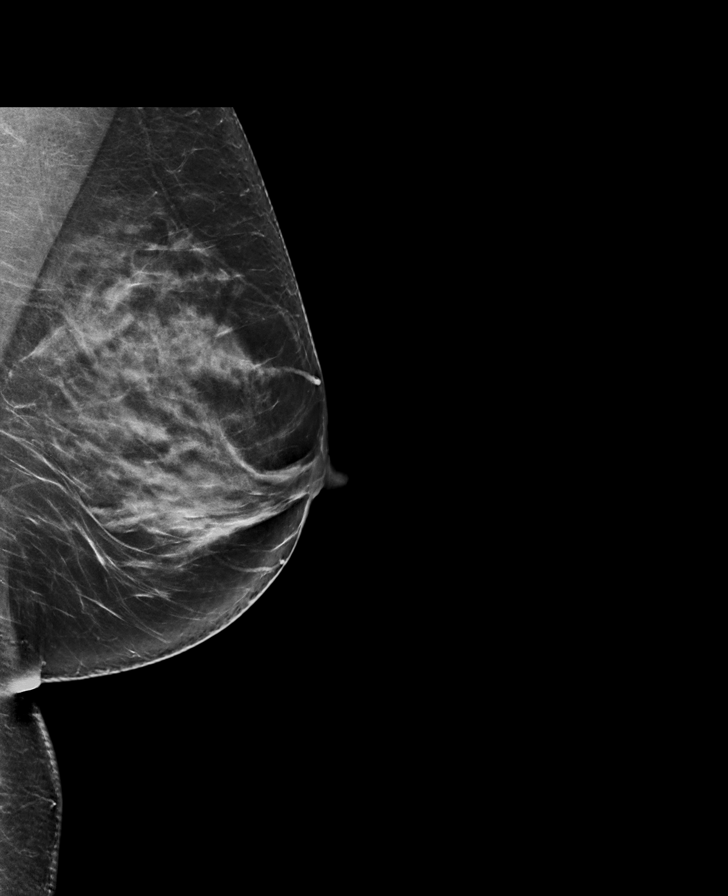

[L CC tomo · tomo slice 37/74.0]
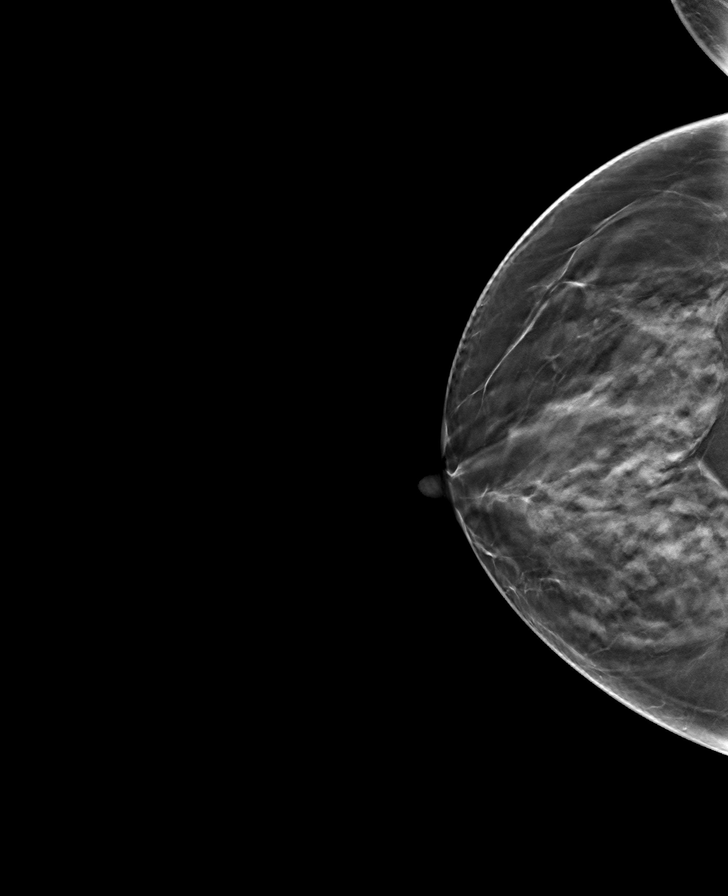

[R MLO tomo · tomo slice 39/76.0]
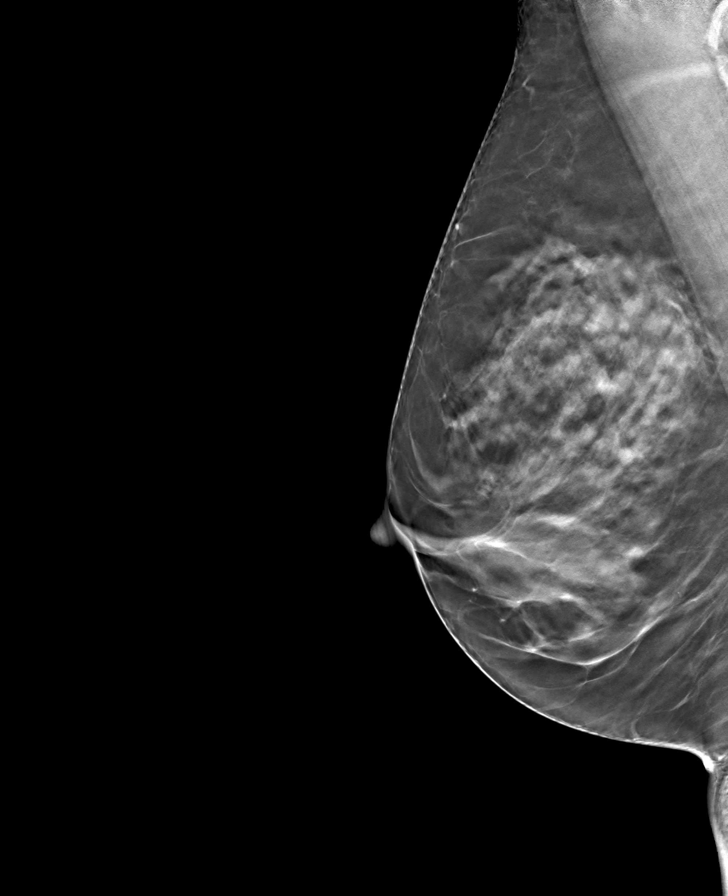

[R CC tomo · tomo slice 38/75.0]
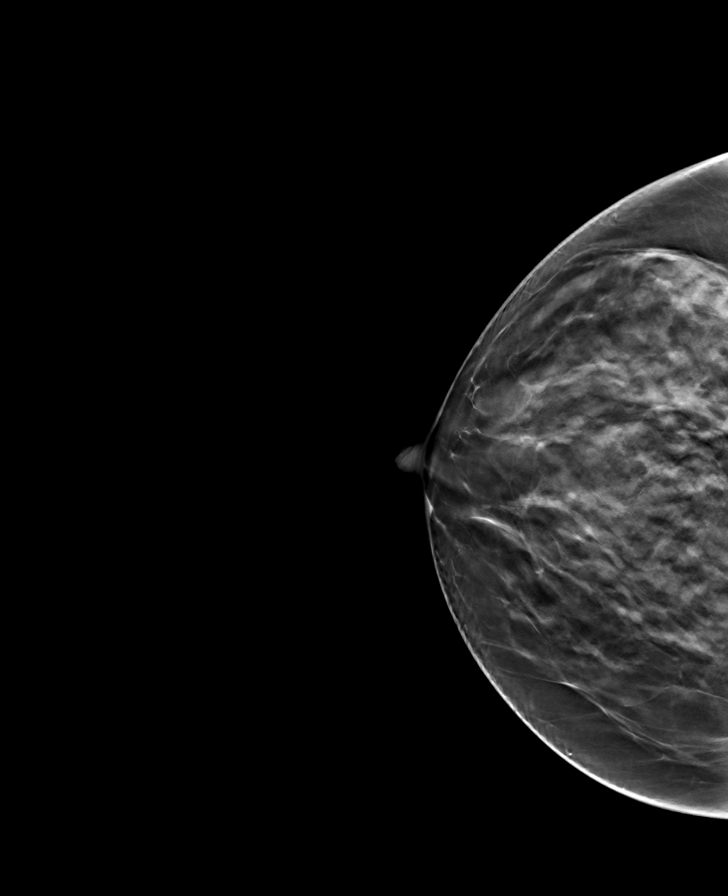

[L MLO tomo · tomo slice 41/81.0]
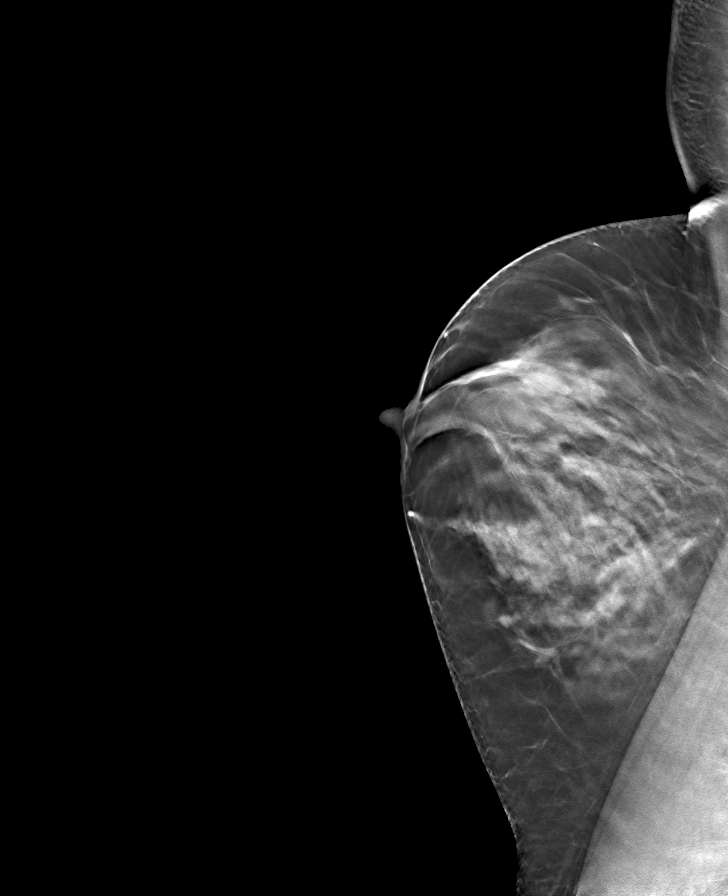

[8 of 24 positions shown; findings below may reference images not displayed]

ACR Breast Density Category c: The breast tissue is heterogeneously
dense, which may obscure small masses.
FINDINGS: There are no findings suspicious for malignancy. Images were
processed with CAD.
IMPRESSION: No mammographic evidence of malignancy. A result letter of this
screening mammogram will be mailed directly to the patient.

RECOMMENDATION:
Screening mammogram in one year. (Code:[5V])

BI-RADS CATEGORY  1: Negative.

## 2018-08-17 ENCOUNTER — Other Ambulatory Visit: Payer: Self-pay | Admitting: Family Medicine

## 2018-08-17 DIAGNOSIS — L719 Rosacea, unspecified: Secondary | ICD-10-CM

## 2018-08-19 ENCOUNTER — Other Ambulatory Visit: Payer: Self-pay | Admitting: Family Medicine

## 2018-08-19 DIAGNOSIS — K219 Gastro-esophageal reflux disease without esophagitis: Secondary | ICD-10-CM

## 2018-08-19 DIAGNOSIS — E119 Type 2 diabetes mellitus without complications: Secondary | ICD-10-CM

## 2018-08-25 ENCOUNTER — Ambulatory Visit: Payer: BLUE CROSS/BLUE SHIELD | Admitting: Family Medicine

## 2018-08-25 ENCOUNTER — Encounter: Payer: Self-pay | Admitting: Family Medicine

## 2018-08-25 VITALS — BP 122/80 | HR 79 | Temp 97.7°F | Resp 16 | Wt 193.4 lb

## 2018-08-25 DIAGNOSIS — B349 Viral infection, unspecified: Secondary | ICD-10-CM | POA: Diagnosis not present

## 2018-08-25 DIAGNOSIS — R0989 Other specified symptoms and signs involving the circulatory and respiratory systems: Secondary | ICD-10-CM

## 2018-08-25 MED ORDER — OSELTAMIVIR PHOSPHATE 75 MG PO CAPS: 75.0000 mg | ORAL_CAPSULE | Freq: Two times a day (BID) | ORAL | 0 refills | Status: DC

## 2018-08-25 MED ORDER — AZITHROMYCIN 250 MG PO TABS: ORAL_TABLET | ORAL | 0 refills | Status: DC

## 2018-08-25 MED ORDER — HYDROCODONE-HOMATROPINE 5-1.5 MG/5ML PO SYRP: 5.0000 mL | ORAL_SOLUTION | Freq: Three times a day (TID) | ORAL | 0 refills | Status: DC | PRN

## 2018-08-25 NOTE — Progress Notes (Signed)
Patient: Judith Fox Female    DOB: August 12, 1965   53 y.o.   MRN: [id number] Visit Date: 08/25/2018  Today's Provider: Vernie Murders, PA   Chief Complaint  Patient presents with  . URI   Subjective:     URI   This is a new problem. The current episode started yesterday. The problem has been gradually worsening. There has been no fever. Associated symptoms include coughing, ear pain (Left ear), nausea, rhinorrhea, a sore throat, swollen glands and wheezing. Pertinent negatives include no chest pain, congestion, headaches, plugged ear sensation or sinus pain. She has tried decongestant (zycam, multi cold decongestant) for the symptoms.   Past Medical History:  Diagnosis Date  . Allergy   . Diabetes mellitus without complication ([place])   . Hypertension   . Migraine   . Myocardial infarction [facility])    Past Surgical History:  Procedure Laterality Date  . JOINT REPLACEMENT Right    knee replacement   Family History  Problem Relation Age of Onset  . Migraines Mother   . Cancer Maternal Aunt        lung  . Cancer Maternal Grandmother        breast  . Diabetes Maternal Grandmother   . Breast cancer Maternal Grandmother Nov 19, 2058  . Cancer Father        died age 68 with metastatic cancer, uncertain origin  . Hypertension Brother   . Breast cancer Cousin 82  . Breast cancer Cousin 50   Allergies  Allergen Reactions  . Latex Rash and Swelling    Eye swelling   . Butorphanol Other (See Comments)    Other reaction(s): Other (See Comments) drowsiness somnolence   . Butorphanol Tartrate     Other reaction(s): Other (See Comments), Other (See Comments) drowsiness drowsiness somnolence   . Lactose Intolerance (Gi) Other (See Comments)    Reflux and Indigestion    Current Outpatient Medications:  .  aspirin EC 81 MG tablet, Take 81 mg by mouth daily., Disp: , Rfl:  .  atorvastatin (LIPITOR) 40 MG tablet, Take 40 mg by mouth daily., Disp: , Rfl: 3 .   benazepril (LOTENSIN) 40 MG tablet, TAKE 1 TABLET BY MOUTH EVERY DAY, Disp: 90 tablet, Rfl: 3 .  diphenhydrAMINE (BENADRYL) 50 MG tablet, Take 0.5 tablets (25 mg total) by mouth every 8 (eight) hours as needed (migraine)., Disp: 15 tablet, Rfl: 0 .  diphenhydrAMINE (BENADRYL) 50 MG/ML injection, Inject 50 mg into the vein once., Disp: , Rfl:  .  docusate sodium (STOOL SOFTENER) 100 MG capsule, Take 100 mg by mouth 2 (two) times daily., Disp: , Rfl:  .  doxycycline (PERIOSTAT) 20 MG tablet, TAKE 1 TABLET BY MOUTH TWICE A DAY, Disp: 60 tablet, Rfl: 0 .  fexofenadine (ALLEGRA) 180 MG tablet, Take 180 mg by mouth daily., Disp: , Rfl:  .  fluconazole (DIFLUCAN) 100 MG tablet, TAKE 1 TABLET BY MOUTH ONCE WEEKLY AND AS NEEDED AS DIRECTED BY PRESCRIBER, Disp: , Rfl: 3 .  glipiZIDE (GLUCOTROL XL) 10 MG 24 hr tablet, Take 10 mg by mouth daily., Disp: , Rfl: 0 .  glipiZIDE (GLUCOTROL) 10 MG tablet, TAKE 1 TABLET (10 MG TOTAL) BY MOUTH DAILY BEFORE BREAKFAST., Disp: 90 tablet, Rfl: 3 .  ketorolac (TORADOL) 10 MG tablet, Take 1 tablet (10 mg total) by mouth every 8 (eight) hours as needed for moderate pain (with food)., Disp: 15 tablet, Rfl: 0 .  ketorolac (TORADOL) 30 MG/ML injection,  INJ UTD ONCE FOR MIGRAINE, Disp: , Rfl: 5 .  loratadine (CLARITIN) 10 MG tablet, Take by mouth as needed., Disp: , Rfl:  .  metFORMIN (GLUCOPHAGE) 1000 MG tablet, TAKE 1 TABLET (1,000 MG TOTAL) BY MOUTH 2 (TWO) TIMES DAILY WITH A MEAL., Disp: 180 tablet, Rfl: 3 .  metoCLOPramide (REGLAN) 5 MG/ML injection, Inject 10 mg into the vein., Disp: , Rfl:  .  Multiple Vitamin (MULTIVITAMIN) capsule, Take 1 capsule by mouth daily., Disp: , Rfl:  .  naproxen (NAPROSYN) 500 MG tablet, TAKE 1 TABLET (500 MG TOTAL) BY MOUTH 2 (TWO) TIMES DAILY WITH A MEAL., Disp: , Rfl: 3 .  nitroGLYCERIN (NITROSTAT) 0.4 MG SL tablet, Place 1 tablet (0.4 mg total) under the tongue every 5 (five) minutes as needed for chest pain., Disp: 50 tablet, Rfl: 3 .   omeprazole (PRILOSEC) 20 MG capsule, TAKE 1 CAPSULE (20 MG TOTAL) BY MOUTH 2 (TWO) TIMES DAILY BEFORE A MEAL., Disp: 180 capsule, Rfl: 3 .  ondansetron (ZOFRAN) 8 MG tablet, Take 8 mg by mouth every 8 (eight) hours as needed. for nausea, Disp: , Rfl: 5 .  prochlorperazine (COMPAZINE) 10 MG tablet, Take 1 tablet (10 mg total) by mouth every 6 (six) hours as needed for nausea or vomiting (migraine)., Disp: 15 tablet, Rfl: 0 .  promethazine (PHENERGAN) 25 MG tablet, Take 1 tablet (25 mg total) by mouth every 6 (six) hours as needed for nausea or vomiting., Disp: 35 tablet, Rfl: 0  Review of Systems  Constitutional: Positive for fatigue. Negative for chills.  HENT: Positive for ear pain (Left ear), postnasal drip, rhinorrhea, sore throat and trouble swallowing. Negative for congestion, sinus pressure and sinus pain.   Eyes:       Watery eyes  Respiratory: Positive for cough, chest tightness, shortness of breath and wheezing.   Cardiovascular: Negative for chest pain and leg swelling.  Gastrointestinal: Positive for nausea.  Neurological: Negative for headaches.   Social History   Tobacco Use  . Smoking status: Never Smoker  . Smokeless tobacco: Never Used  Substance Use Topics  . Alcohol use: Yes    Alcohol/week: 2.0 standard drinks    Types: 1 Glasses of wine, 1 Shots of liquor per week    Comment: average varies, possibly once monthly     Objective:   BP 122/80 (BP Location: Right Arm, Patient Position: Sitting, Cuff Size: Large)   Pulse 79   Temp 97.7 F (36.5 C) (Oral)   Resp 16   Wt 193 lb 6.4 oz (87.7 kg)   SpO2 98%   BMI 28.15 kg/m  Vitals:   08/25/18 1004  BP: 122/80  Pulse: 79  Resp: 16  Temp: 97.7 F (36.5 C)  TempSrc: Oral  SpO2: 98%  Weight: 193 lb 6.4 oz (87.7 kg)   Physical Exam Constitutional:      General: She is not in acute distress.    Appearance: She is well-developed.  HENT:     Head: Normocephalic and atraumatic.     Right Ear: Hearing, tympanic  membrane and ear canal normal.     Left Ear: Hearing and ear canal normal.     Ears:     Comments: Dull reflex/hazy left TM with questionable bulge.    Nose: Nose normal.  Eyes:     General: Lids are normal. No scleral icterus.       Right eye: No discharge.        Left eye: No discharge.  Conjunctiva/sclera: Conjunctivae normal.  Neck:     Musculoskeletal: Neck supple.  Cardiovascular:     Rate and Rhythm: Normal rate and regular rhythm.     Heart sounds: Normal heart sounds.  Pulmonary:     Effort: Pulmonary effort is normal. No respiratory distress.     Breath sounds: No stridor. Rales present. No wheezing or rhonchi.     Comments: Few crackles in the right posterior base. Chest:     Chest wall: No tenderness.  Musculoskeletal: Normal range of motion.  Lymphadenopathy:     Cervical: No cervical adenopathy.  Skin:    Findings: No lesion or rash.  Neurological:     Mental Status: She is alert and oriented to person, place, and time.  Psychiatric:        Speech: Speech normal.        Behavior: Behavior normal.        Thought Content: Thought content normal.       Assessment & Plan    1. Rales Sudden onset of sore throat, cough and general malaise. History of similar episode 09-09-17 with pneumonia following a viral syndrome. Few crackles heard in the right posterior base. No fever or chest pains. Requested pneumonia and flu treatment with follow up next week before getting x-ray or blood tests (out of flu tests today). Has a history of DM and  MI. Will be a little more aggressive with treatment and follow up. - azithromycin (ZITHROMAX) 250 MG tablet; Take 2 tablets by mouth today then one daily for 4 days.  Dispense: 6 tablet; Refill: 0 - HYDROcodone-homatropine (HYCODAN) 5-1.5 MG/5ML syrup; Take 5 mLs by mouth every 8 (eight) hours as needed for cough.  Dispense: 120 mL; Refill: 0  2. Acute viral syndrome Onset over the past 24 hours with general malaise and hacking cough  with eyes burning, sore throat, PND, rhinorrhea and stuffy left ear. No documented fever but has been taking Tylenol and OTC cough/cold medications. Suspected flu-like syndrome with possible early RLL pneumonia. Treat with Z-pak, Tamiflu and Hycodan. Recheck in 5-6 days. Increase fluid intake and home to rest. - oseltamivir (TAMIFLU) 75 MG capsule; Take 1 capsule (75 mg total) by mouth 2 (two) times daily.  Dispense: 10 capsule; Refill: 0 - HYDROcodone-homatropine (HYCODAN) 5-1.5 MG/5ML syrup; Take 5 mLs by mouth every 8 (eight) hours as needed for cough.  Dispense: 120 mL; Refill: 0     Vernie Murders, PA  [facility]

## 2018-08-30 NOTE — Progress Notes (Addendum)
Patient: Judith Fox Female    DOB: 1965/10/26   53 y.o.   MRN: [id number] Visit Date: 08/31/2018  Today's Provider: Vernie Murders, PA   Chief Complaint  Patient presents with  . Follow-up    1 week follow up Rales   Subjective:     HPI  Patient comes today for 1 week follow-up.Treated with Z-pak, Tamiflu and Hycodan for Flu like symptoms and for possible pneumonia. Patient reports that she didn't take the Hycodan but reports feeling better than a week ago.  Past Medical History:  Diagnosis Date  . Allergy   . Diabetes mellitus without complication ([place])   . Hypertension   . Migraine   . Myocardial infarction [facility])    Patient Active Problem List   Diagnosis Date Noted  . Varicose veins of both lower extremities with pain 05/16/2018  . Bilateral lower extremity edema 05/16/2018  . Mixed hyperlipidemia 09/05/2017  . Anxiety 06/26/2014  . Syncope and collapse 07/30/2013  . Palpitations 03/11/2013  . Diabetes ([place]) 03/01/2013  . History of non-ST elevation myocardial infarction (NSTEMI) 03/01/2013  . Essential hypertension 03/01/2013  . 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  . Chronic pain syndrome 12/27/2011  . 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 Chronic migraine without aura 10/12/2011  . Migraine without aura 10/12/2011   Past Surgical History:  Procedure Laterality Date  . JOINT REPLACEMENT Right    knee replacement   Family History  Problem Relation Age of Onset  . Migraines Mother   . Cancer Maternal Aunt        lung  . Cancer Maternal Grandmother        breast  . Diabetes Maternal Grandmother   . Breast cancer Maternal Grandmother 2058/11/09  . Cancer Father        died age 69 with metastatic cancer, uncertain origin  . Hypertension Brother   . Breast cancer Cousin 76  . Breast cancer Cousin 50   Allergies  Allergen Reactions  . Latex Rash and Swelling    Eye swelling   . Butorphanol Other (See Comments)    Other reaction(s):  Other (See Comments) drowsiness somnolence   . Butorphanol Tartrate     Other reaction(s): Other (See Comments), Other (See Comments) drowsiness drowsiness somnolence   . Lactose Intolerance (Gi) Other (See Comments)    Reflux and Indigestion    Current Outpatient Medications:  .  aspirin EC 81 MG tablet, Take 81 mg by mouth daily., Disp: , Rfl:  .  atorvastatin (LIPITOR) 40 MG tablet, Take 40 mg by mouth daily., Disp: , Rfl: 3 .  benazepril (LOTENSIN) 40 MG tablet, TAKE 1 TABLET BY MOUTH EVERY DAY, Disp: 90 tablet, Rfl: 3 .  diphenhydrAMINE (BENADRYL) 50 MG tablet, Take 0.5 tablets (25 mg total) by mouth every 8 (eight) hours as needed (migraine)., Disp: 15 tablet, Rfl: 0 .  diphenhydrAMINE (BENADRYL) 50 MG/ML injection, Inject 50 mg into the vein once., Disp: , Rfl:  .  doxycycline (PERIOSTAT) 20 MG tablet, TAKE 1 TABLET BY MOUTH TWICE A DAY, Disp: 60 tablet, Rfl: 0 .  fexofenadine (ALLEGRA) 180 MG tablet, Take 180 mg by mouth daily., Disp: , Rfl:  .  fluconazole (DIFLUCAN) 100 MG tablet, TAKE 1 TABLET BY MOUTH ONCE WEEKLY AND AS NEEDED AS DIRECTED BY PRESCRIBER, Disp: , Rfl: 3 .  glipiZIDE (GLUCOTROL XL) 10 MG 24 hr tablet, Take 10 mg by mouth daily., Disp: , Rfl: 0 .  glipiZIDE (GLUCOTROL)  10 MG tablet, TAKE 1 TABLET (10 MG TOTAL) BY MOUTH DAILY BEFORE BREAKFAST., Disp: 90 tablet, Rfl: 3 .  ketorolac (TORADOL) 10 MG tablet, Take 1 tablet (10 mg total) by mouth every 8 (eight) hours as needed for moderate pain (with food)., Disp: 15 tablet, Rfl: 0 .  ketorolac (TORADOL) 30 MG/ML injection, INJ UTD ONCE FOR MIGRAINE, Disp: , Rfl: 5 .  loratadine (CLARITIN) 10 MG tablet, Take by mouth as needed., Disp: , Rfl:  .  metFORMIN (GLUCOPHAGE) 1000 MG tablet, TAKE 1 TABLET (1,000 MG TOTAL) BY MOUTH 2 (TWO) TIMES DAILY WITH A MEAL., Disp: 180 tablet, Rfl: 3 .  metoCLOPramide (REGLAN) 5 MG/ML injection, Inject 10 mg into the vein., Disp: , Rfl:  .  Multiple Vitamin (MULTIVITAMIN) capsule, Take 1  capsule by mouth daily., Disp: , Rfl:  .  naproxen (NAPROSYN) 500 MG tablet, TAKE 1 TABLET (500 MG TOTAL) BY MOUTH 2 (TWO) TIMES DAILY WITH A MEAL., Disp: , Rfl: 3 .  nitroGLYCERIN (NITROSTAT) 0.4 MG SL tablet, Place 1 tablet (0.4 mg total) under the tongue every 5 (five) minutes as needed for chest pain., Disp: 50 tablet, Rfl: 3 .  omeprazole (PRILOSEC) 20 MG capsule, TAKE 1 CAPSULE (20 MG TOTAL) BY MOUTH 2 (TWO) TIMES DAILY BEFORE A MEAL., Disp: 180 capsule, Rfl: 3 .  ondansetron (ZOFRAN) 8 MG tablet, Take 8 mg by mouth every 8 (eight) hours as needed. for nausea, Disp: , Rfl: 5 .  prochlorperazine (COMPAZINE) 10 MG tablet, Take 1 tablet (10 mg total) by mouth every 6 (six) hours as needed for nausea or vomiting (migraine)., Disp: 15 tablet, Rfl: 0 .  promethazine (PHENERGAN) 25 MG tablet, Take 1 tablet (25 mg total) by mouth every 6 (six) hours as needed for nausea or vomiting., Disp: 35 tablet, Rfl: 0 .  azithromycin (ZITHROMAX) 250 MG tablet, Take 2 tablets by mouth today then one daily for 4 days. (Patient not taking: Reported on 08/31/2018), Disp: 6 tablet, Rfl: 0 .  HYDROcodone-homatropine (HYCODAN) 5-1.5 MG/5ML syrup, Take 5 mLs by mouth every 8 (eight) hours as needed for cough. (Patient not taking: Reported on 08/31/2018), Disp: 120 mL, Rfl: 0 .  oseltamivir (TAMIFLU) 75 MG capsule, Take 1 capsule (75 mg total) by mouth 2 (two) times daily. (Patient not taking: Reported on 08/31/2018), Disp: 10 capsule, Rfl: 0  Review of Systems  Constitutional: Negative.   HENT: Negative.   Respiratory: Negative.   Cardiovascular: Negative.     Social History   Tobacco Use  . Smoking status: Never Smoker  . Smokeless tobacco: Never Used  Substance Use Topics  . Alcohol use: Yes    Alcohol/week: 2.0 standard drinks    Types: 1 Glasses of wine, 1 Shots of liquor per week    Comment: average varies, possibly once monthly     Objective:   BP 120/70 (BP Location: Left Arm, Patient Position:  Sitting, Cuff Size: Large)   Pulse 88   Temp 98.4 F (36.9 C) (Oral)   Resp 16   Wt 196 lb 9.6 oz (89.2 kg)   SpO2 98%   BMI 28.62 kg/m  Vitals:   08/31/18 1020  BP: 120/70  Pulse: 88  Resp: 16  Temp: 98.4 F (36.9 C)  TempSrc: Oral  SpO2: 98%  Weight: 196 lb 9.6 oz (89.2 kg)   Physical Exam Constitutional:      General: She is not in acute distress.    Appearance: She is well-developed.  HENT:  Head: Normocephalic and atraumatic.     Right Ear: Hearing normal.     Left Ear: Hearing normal.     Nose: Nose normal.  Eyes:     General: Lids are normal. No scleral icterus.       Right eye: No discharge.        Left eye: No discharge.     Conjunctiva/sclera: Conjunctivae normal.  Pulmonary:     Effort: Pulmonary effort is normal. No respiratory distress.  Musculoskeletal: Normal range of motion.  Skin:    Findings: No lesion or rash.  Neurological:     Mental Status: She is alert and oriented to person, place, and time.  Psychiatric:        Speech: Speech normal.        Behavior: Behavior normal.        Thought Content: Thought content normal.       Assessment & Plan    1. Rales Will finish the Azithromycin in a day or two and rarely used the Hycodan for cough. No longer have rales on exam and cough improved. No fever today and remainder of respiratory symptoms are improved. No wheezes or rhonchi. Recheck if any return of symptoms.   2. Antibiotic-induced yeast infection Reports a history of easily getting vaginal yeast with antibiotic use. Some early symptoms of itching irritation. Will get Diflucan and recheck if discharge or vaginal irritation persisting. - fluconazole (DIFLUCAN) 150 MG tablet; Take 1 tablet (150 mg total) by mouth once for 1 dose. May repeat in 7 days if needed for yeast infection.  Dispense: 2 tablet; Refill: [place], PA  [facility]

## 2018-08-31 ENCOUNTER — Encounter: Payer: Self-pay | Admitting: Family Medicine

## 2018-08-31 ENCOUNTER — Ambulatory Visit: Payer: BLUE CROSS/BLUE SHIELD | Admitting: Family Medicine

## 2018-08-31 VITALS — BP 120/70 | HR 88 | Temp 98.4°F | Resp 16 | Wt 196.6 lb

## 2018-08-31 DIAGNOSIS — B379 Candidiasis, unspecified: Secondary | ICD-10-CM | POA: Diagnosis not present

## 2018-08-31 DIAGNOSIS — R0989 Other specified symptoms and signs involving the circulatory and respiratory systems: Secondary | ICD-10-CM | POA: Diagnosis not present

## 2018-08-31 DIAGNOSIS — T3695XA Adverse effect of unspecified systemic antibiotic, initial encounter: Secondary | ICD-10-CM | POA: Diagnosis not present

## 2018-08-31 MED ORDER — FLUCONAZOLE 150 MG PO TABS: 150.0000 mg | ORAL_TABLET | Freq: Once | ORAL | 0 refills | Status: AC

## 2018-09-04 ENCOUNTER — Ambulatory Visit: Payer: BLUE CROSS/BLUE SHIELD | Admitting: Family Medicine

## 2018-09-05 ENCOUNTER — Other Ambulatory Visit: Payer: Self-pay | Admitting: Family Medicine

## 2018-09-05 NOTE — Telephone Encounter (Signed)
Pharmacy requesting refills. Thanks!  

## 2018-09-07 ENCOUNTER — Encounter: Payer: Self-pay | Admitting: Family Medicine

## 2018-09-07 ENCOUNTER — Other Ambulatory Visit: Payer: Self-pay

## 2018-09-07 ENCOUNTER — Ambulatory Visit: Payer: BLUE CROSS/BLUE SHIELD | Admitting: Family Medicine

## 2018-09-07 VITALS — BP 130/80 | HR 97 | Temp 98.2°F | Ht 69.0 in | Wt 195.2 lb

## 2018-09-07 DIAGNOSIS — N200 Calculus of kidney: Secondary | ICD-10-CM | POA: Diagnosis not present

## 2018-09-07 DIAGNOSIS — M545 Low back pain, unspecified: Secondary | ICD-10-CM

## 2018-09-07 DIAGNOSIS — E119 Type 2 diabetes mellitus without complications: Secondary | ICD-10-CM

## 2018-09-07 DIAGNOSIS — J189 Pneumonia, unspecified organism: Secondary | ICD-10-CM | POA: Diagnosis not present

## 2018-09-07 LAB — POCT URINALYSIS DIPSTICK
BILIRUBIN UA: NEGATIVE
Blood, UA: NEGATIVE
GLUCOSE UA: POSITIVE — AB
Leukocytes, UA: NEGATIVE
Nitrite, UA: NEGATIVE
PH UA: 5 (ref 5.0–8.0)
Protein, UA: POSITIVE — AB
Spec Grav, UA: >=1.03 — AB (ref 1.010–1.025)
Urobilinogen, UA: 0.2 E.U./dL

## 2018-09-07 LAB — POCT GLYCOSYLATED HEMOGLOBIN (HGB A1C): HEMOGLOBIN A1C: 9.2 % — AB (ref 4.0–5.6)

## 2018-09-07 MED ORDER — HYDROCODONE-ACETAMINOPHEN 5-325 MG PO TABS: 1.0000 | ORAL_TABLET | ORAL | 0 refills | Status: DC | PRN

## 2018-09-07 MED ORDER — KETOROLAC TROMETHAMINE 60 MG/2ML IM SOLN
60.0000 mg | Freq: Once | INTRAMUSCULAR | Status: AC
  Administered 2018-09-07: 60 mg via INTRAMUSCULAR

## 2018-09-07 NOTE — Progress Notes (Signed)
Patient: Judith Fox Female    DOB: 23-Mar-1966   53 y.o.   MRN: [id number] Visit Date: 09/07/2018  Today's Provider: Wilhemena Durie, MD   Chief Complaint  Patient presents with  . Follow-up    4 month vericose veins   Subjective:     HPI   Diabetes Mellitus Type II, Follow-up:   Lab Results  Component Value Date   HGBA1C 9.2 (A) 09/07/2018   HGBA1C 7.5 (H) 04/28/2018    Last seen for diabetes 4 months ago.  Management since then includes no changes. She reports good compliance with treatment. She is not having side effects.  Current symptoms include none and have been stable. Home blood sugar records: fasting range: pt doesn't check he BS regularly  Episodes of hypoglycemia? no   Current Insulin Regimen: none Most Recent Eye Exam: 05/2018 Weight trend: stable Prior visit with dietician: no Current diet: well balanced plus eats candy  Current exercise: daily activity.  has home gym  Pertinent Labs:    Component Value Date/Time   CHOL 94 (L) 04/28/2018 0810   TRIG 75 04/28/2018 0810   HDL 47 04/28/2018 0810   LDLCALC 32 04/28/2018 0810   CREATININE 0.81 04/28/2018 0810    Wt Readings from Last 3 Encounters:  09/07/18 195 lb 3.2 oz (88.5 kg)  08/31/18 196 lb 9.6 oz (89.2 kg)  08/25/18 193 lb 6.4 oz (87.7 kg)   The main issue the patient has today is 1 of right low back pain that initially was thought to be musculoskeletal but now she thinks she has kidney stones.  She has a history of kidney stones.  She states that for the past few days she is found it difficult to find a comfortable position.  She has full range of motion her lower back and no sciatic pain. ------------------------------------------------------------------------   Allergies  Allergen Reactions  . Latex Rash and Swelling    Eye swelling   . Butorphanol Other (See Comments)    Other reaction(s): Other (See Comments) drowsiness somnolence   . Butorphanol  Tartrate     Other reaction(s): Other (See Comments), Other (See Comments) drowsiness drowsiness somnolence   . Lactose Intolerance (Gi) Other (See Comments)    Reflux and Indigestion     Current Outpatient Medications:  .  aspirin EC 81 MG tablet, Take 81 mg by mouth daily., Disp: , Rfl:  .  atorvastatin (LIPITOR) 40 MG tablet, Take 40 mg by mouth daily., Disp: , Rfl: 3 .  azithromycin (ZITHROMAX) 250 MG tablet, Take 2 tablets by mouth today then one daily for 4 days. (Patient not taking: Reported on 08/31/2018), Disp: 6 tablet, Rfl: 0 .  benazepril (LOTENSIN) 40 MG tablet, TAKE 1 TABLET BY MOUTH EVERY DAY, Disp: 90 tablet, Rfl: 3 .  diphenhydrAMINE (BENADRYL) 50 MG tablet, Take 0.5 tablets (25 mg total) by mouth every 8 (eight) hours as needed (migraine)., Disp: 15 tablet, Rfl: 0 .  diphenhydrAMINE (BENADRYL) 50 MG/ML injection, Inject 50 mg into the vein once., Disp: , Rfl:  .  doxycycline (PERIOSTAT) 20 MG tablet, TAKE 1 TABLET BY MOUTH TWICE A DAY, Disp: 60 tablet, Rfl: 0 .  fexofenadine (ALLEGRA) 180 MG tablet, Take 180 mg by mouth daily., Disp: , Rfl:  .  glipiZIDE (GLUCOTROL XL) 10 MG 24 hr tablet, Take 10 mg by mouth daily., Disp: , Rfl: 0 .  glipiZIDE (GLUCOTROL) 10 MG tablet, TAKE 1 TABLET (10 MG TOTAL)  BY MOUTH DAILY BEFORE BREAKFAST., Disp: 90 tablet, Rfl: 3 .  HYDROcodone-acetaminophen (NORCO/VICODIN) 5-325 MG tablet, Take 1-2 tablets by mouth every 4 (four) hours as needed for moderate pain., Disp: 35 tablet, Rfl: 0 .  HYDROcodone-homatropine (HYCODAN) 5-1.5 MG/5ML syrup, Take 5 mLs by mouth every 8 (eight) hours as needed for cough. (Patient not taking: Reported on 08/31/2018), Disp: 120 mL, Rfl: 0 .  ketorolac (TORADOL) 10 MG tablet, Take 1 tablet (10 mg total) by mouth every 8 (eight) hours as needed for moderate pain (with food)., Disp: 15 tablet, Rfl: 0 .  ketorolac (TORADOL) 30 MG/ML injection, INJ UTD ONCE FOR MIGRAINE, Disp: , Rfl: 5 .  loratadine (CLARITIN) 10 MG  tablet, Take by mouth as needed., Disp: , Rfl:  .  metFORMIN (GLUCOPHAGE) 1000 MG tablet, TAKE 1 TABLET (1,000 MG TOTAL) BY MOUTH 2 (TWO) TIMES DAILY WITH A MEAL., Disp: 180 tablet, Rfl: 3 .  metoCLOPramide (REGLAN) 5 MG/ML injection, Inject 10 mg into the vein., Disp: , Rfl:  .  Multiple Vitamin (MULTIVITAMIN) capsule, Take 1 capsule by mouth daily., Disp: , Rfl:  .  naproxen (NAPROSYN) 500 MG tablet, TAKE 1 TABLET (500 MG TOTAL) BY MOUTH 2 (TWO) TIMES DAILY WITH A MEAL., Disp: , Rfl: 3 .  nitroGLYCERIN (NITROSTAT) 0.4 MG SL tablet, Place 1 tablet (0.4 mg total) under the tongue every 5 (five) minutes as needed for chest pain., Disp: 50 tablet, Rfl: 3 .  omeprazole (PRILOSEC) 20 MG capsule, TAKE 1 CAPSULE (20 MG TOTAL) BY MOUTH 2 (TWO) TIMES DAILY BEFORE A MEAL., Disp: 180 capsule, Rfl: 3 .  ondansetron (ZOFRAN) 8 MG tablet, Take 8 mg by mouth every 8 (eight) hours as needed. for nausea, Disp: , Rfl: 5 .  oseltamivir (TAMIFLU) 75 MG capsule, Take 1 capsule (75 mg total) by mouth 2 (two) times daily. (Patient not taking: Reported on 08/31/2018), Disp: 10 capsule, Rfl: 0 .  prochlorperazine (COMPAZINE) 10 MG tablet, Take 1 tablet (10 mg total) by mouth every 6 (six) hours as needed for nausea or vomiting (migraine)., Disp: 15 tablet, Rfl: 0 .  promethazine (PHENERGAN) 25 MG tablet, Take 1 tablet (25 mg total) by mouth every 6 (six) hours as needed for nausea or vomiting., Disp: 35 tablet, Rfl: 0  Current Facility-Administered Medications:  .  ketorolac (TORADOL) injection 60 mg, 60 mg, Intramuscular, Once, Jerrol Banana., MD  Review of Systems  Constitutional: Negative.   HENT: Negative.   Eyes: Negative.   Respiratory: Negative.   Cardiovascular: Negative.   Gastrointestinal: Negative.   Endocrine: Negative.   Genitourinary: Negative.   Musculoskeletal: Negative.   Skin: Negative.   Allergic/Immunologic: Negative.   Neurological: Negative.   Hematological: Negative.     Psychiatric/Behavioral: Negative.     Social History   Tobacco Use  . Smoking status: Never Smoker  . Smokeless tobacco: Never Used  Substance Use Topics  . Alcohol use: Yes    Alcohol/week: 2.0 standard drinks    Types: 1 Glasses of wine, 1 Shots of liquor per week    Comment: average varies, possibly once monthly      Objective:   BP 130/80 (BP Location: Right Arm, Patient Position: Sitting, Cuff Size: Normal)   Pulse 97   Temp 98.2 F (36.8 C) (Oral)   Ht 5\' 9"  (1.753 m)   Wt 195 lb 3.2 oz (88.5 kg)   SpO2 99%   BMI 28.83 kg/m  Vitals:   09/07/18 1629  BP: 130/80  Pulse: 97  Temp: 98.2 F (36.8 C)  TempSrc: Oral  SpO2: 99%  Weight: 195 lb 3.2 oz (88.5 kg)  Height: 5\' 9"  (1.753 m)     Physical Exam Constitutional:      Appearance: Normal appearance. She is well-developed and normal weight.  HENT:     Head: Normocephalic.     Right Ear: External ear normal.     Left Ear: External ear normal.     Nose: Nose normal.     Mouth/Throat:     Pharynx: No oropharyngeal exudate.  Eyes:     Conjunctiva/sclera: Conjunctivae normal.     Pupils: Pupils are equal, round, and reactive to light.  Neck:     Musculoskeletal: Normal range of motion and neck supple.  Cardiovascular:     Rate and Rhythm: Normal rate and regular rhythm.     Heart sounds: Normal heart sounds.  Pulmonary:     Effort: Pulmonary effort is normal. No respiratory distress.     Breath sounds: Normal breath sounds.  Abdominal:     Palpations: Abdomen is soft.     Comments: No CVA tenderness  Musculoskeletal:        General: No tenderness.     Comments: Full range of motion in lower back.  No weakness of the legs.  straight leg is raise is negative.  Lymphadenopathy:     Cervical: No cervical adenopathy.  Skin:    General: Skin is warm and dry.     Comments: No rash on back.  Neurological:     General: No focal deficit present.     Mental Status: She is alert and oriented to person,  place, and time. Mental status is at baseline.  Psychiatric:        Mood and Affect: Mood normal.        Behavior: Behavior normal.        Thought Content: Thought content normal.        Judgment: Judgment normal.         Assessment & Plan    1. Type 2 diabetes mellitus without complication, without long-term current use of insulin (HCC) Poor control.  Last A1c is 7.5.  Patient is now retired and ready to work on exercise.  With her history of heart disease and poorly controlled diabetes will go ahead and add Jardiance at 10 mg every morning.  She is given samples.  I will see her back in 3 to 4 weeks. - POCT glycosylated hemoglobin (Hb A1C)9.2  2. Acute low back pain without sciatica, unspecified back pain laterality./right Clinically at this time I think this probably is from kidney stones.  Either way a Toradol injection should help. - POCT Urinalysis Dipstick - Urine Culture - DG Abd 1 View - US Renal - ketorolac (TORADOL) injection 60 mg  3. Kidney stones Urine is normal but she has been told she has multiple kidney stones up higher.  Will obtain urine culture and make sure she is not obstructed with hydronephrosis with renal ultrasound.  4. Pneumonia due to infectious organism, unspecified laterality, unspecified part of lung Clinically she is resolved.  Follow-up chest x-ray when appropriate.    I have done the exam and reviewed the above chart and it is accurate to the best of my knowledge. Development worker, community has been used in this note in any air is in the dictation or transcription are unintentional.  Wilhemena Durie, MD  [place]

## 2018-09-08 ENCOUNTER — Ambulatory Visit
Admission: RE | Admit: 2018-09-08 | Discharge: 2018-09-08 | Disposition: A | Discharge status: Home / Self Care | Payer: BLUE CROSS/BLUE SHIELD | Source: Ambulatory Visit | Attending: Family Medicine | Admitting: Family Medicine

## 2018-09-08 DIAGNOSIS — M545 Low back pain: Secondary | ICD-10-CM | POA: Insufficient documentation

## 2018-09-08 IMAGING — CR DG ABDOMEN 1V
1 series · 2 of 2 positions shown · non-contrast
Comparison: None.

CLINICAL DATA: Acute low back pain.

EXAM:
ABDOMEN - 1 VIEW

[Series 1: dg abd 1 view · 0.14mm/px · 2 of 2 slices shown]
[im 1/2]
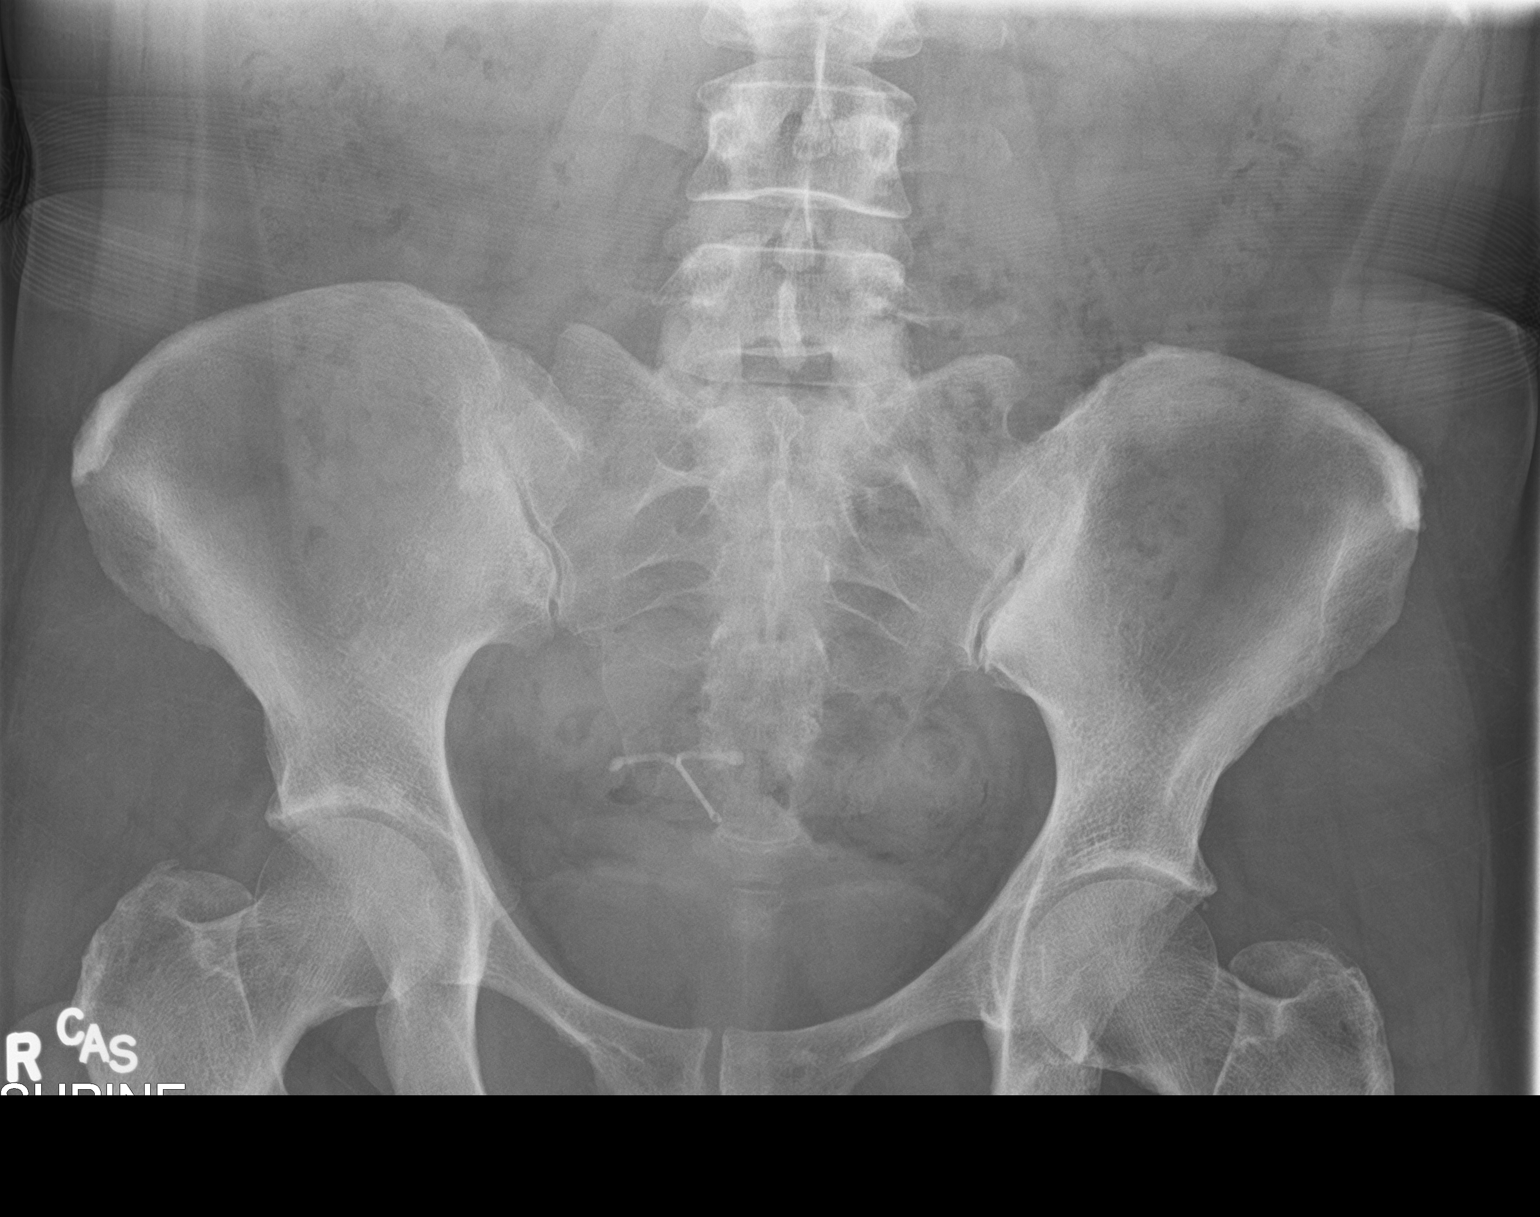
[im 2/2]
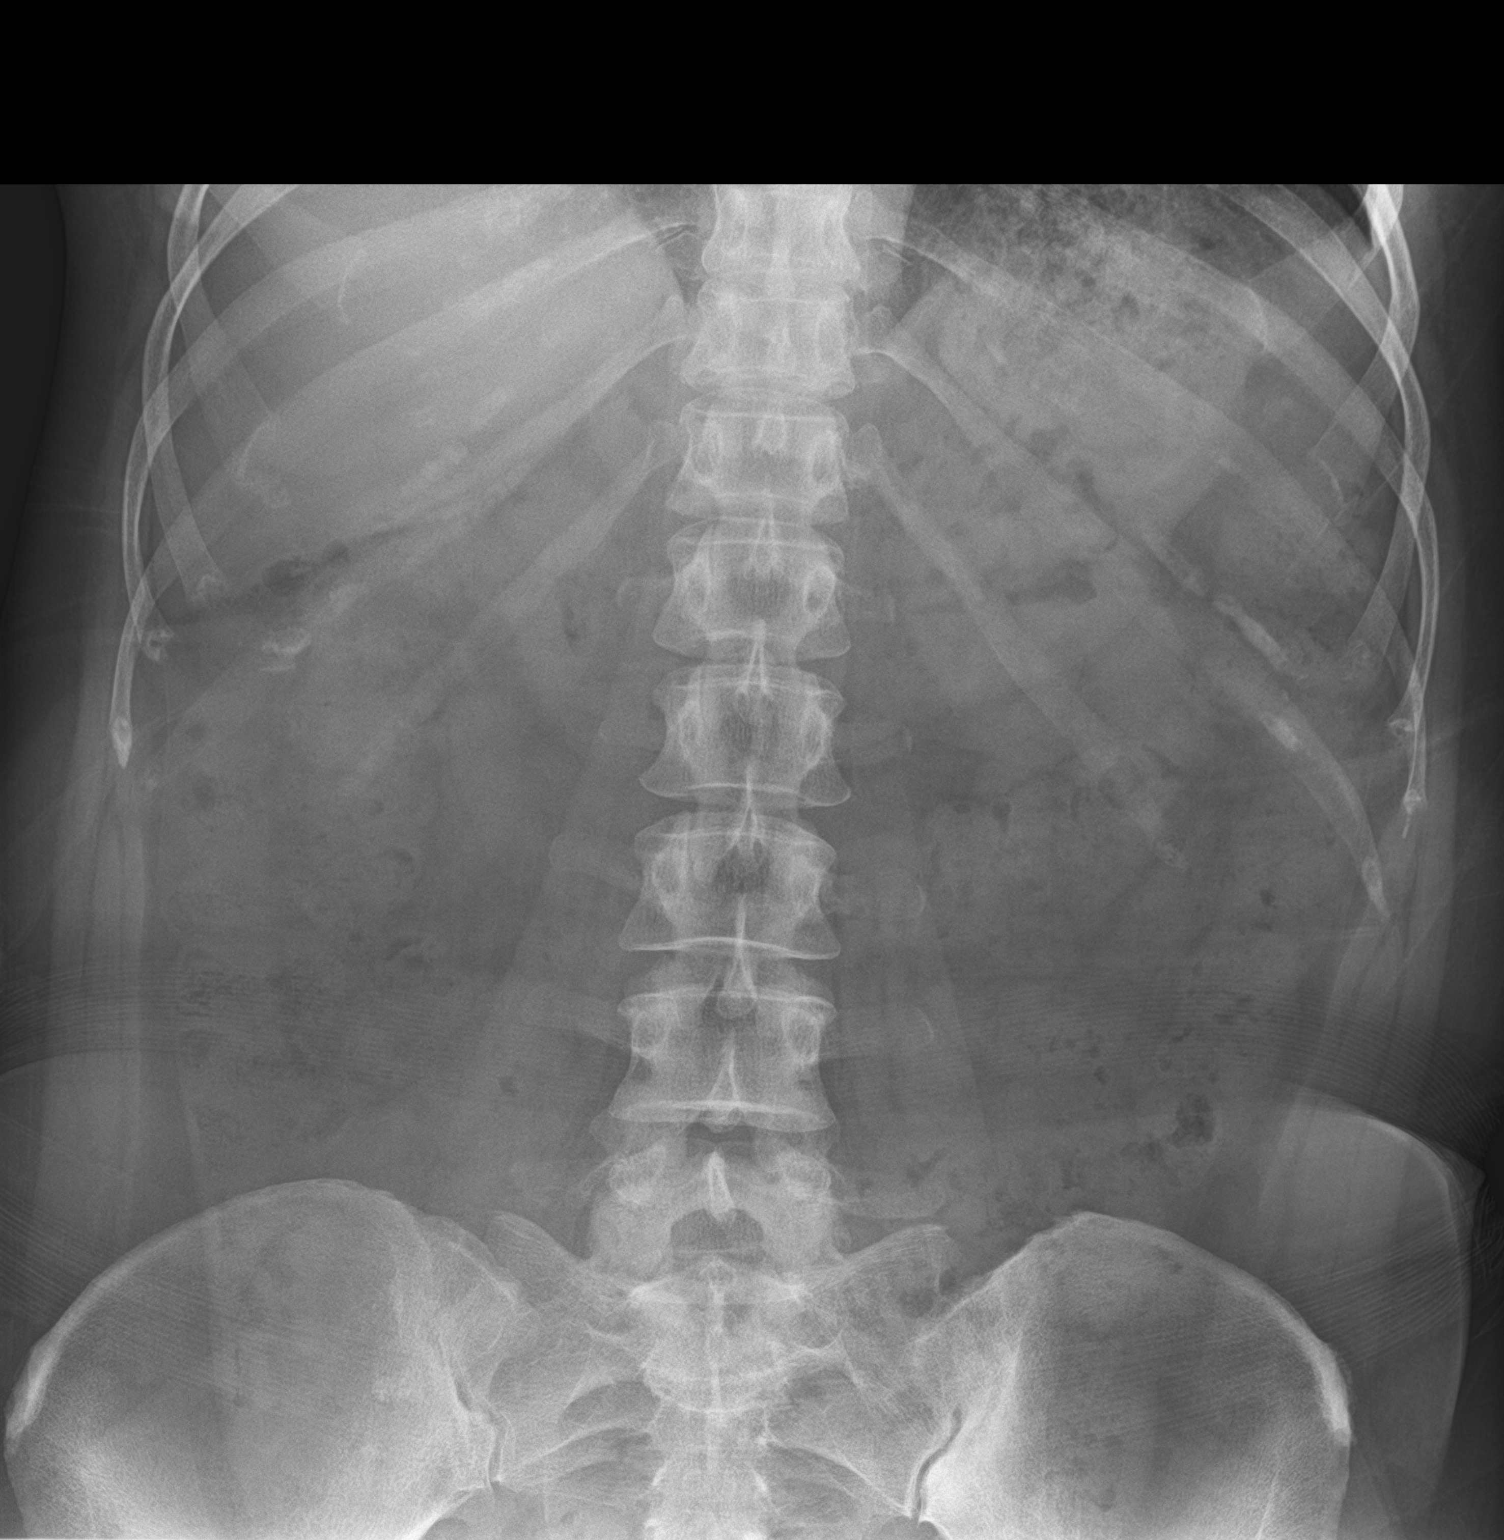

[2 of 2 positions shown; findings below may reference images not displayed]

FINDINGS: Normal bowel gas pattern. No calcified urinary tract calculi seen.
Intrauterine device in the central pelvis to the right of midline.
Mild left sacroiliac joint degenerative changes.
IMPRESSION: No acute abnormality.

## 2018-09-10 LAB — URINE CULTURE

## 2018-09-11 ENCOUNTER — Telehealth: Payer: Self-pay

## 2018-09-11 NOTE — Telephone Encounter (Signed)
-----   Message from Jerrol Banana., MD sent at 09/08/2018  6:10 PM EST ----- KUB normal.

## 2018-09-11 NOTE — Telephone Encounter (Signed)
LVMTRC 

## 2018-09-11 NOTE — Telephone Encounter (Signed)
Patient returned phone call and was advised. Patient wanted to inform you that she did pass a kidney stone on 09/08/2018 right before she went to have her US done.

## 2018-09-11 NOTE — Telephone Encounter (Signed)
-----   Message from Jerrol Banana., MD sent at 09/08/2018  6:10 PM EST ----- Normal [place]

## 2018-09-15 ENCOUNTER — Other Ambulatory Visit: Payer: Self-pay | Admitting: Family Medicine

## 2018-09-15 DIAGNOSIS — L719 Rosacea, unspecified: Secondary | ICD-10-CM

## 2018-09-17 ENCOUNTER — Other Ambulatory Visit: Payer: Self-pay | Admitting: Family Medicine

## 2018-09-27 ENCOUNTER — Encounter: Payer: Self-pay | Admitting: Family Medicine

## 2018-09-27 ENCOUNTER — Ambulatory Visit (INDEPENDENT_AMBULATORY_CARE_PROVIDER_SITE_OTHER): Payer: BLUE CROSS/BLUE SHIELD | Admitting: Family Medicine

## 2018-09-27 VITALS — BP 138/86 | HR 100 | Temp 98.5°F | Ht 69.5 in | Wt 191.0 lb

## 2018-09-27 DIAGNOSIS — G43709 Chronic migraine without aura, not intractable, without status migrainosus: Secondary | ICD-10-CM

## 2018-09-27 DIAGNOSIS — E782 Mixed hyperlipidemia: Secondary | ICD-10-CM | POA: Diagnosis not present

## 2018-09-27 DIAGNOSIS — N200 Calculus of kidney: Secondary | ICD-10-CM

## 2018-09-27 DIAGNOSIS — E119 Type 2 diabetes mellitus without complications: Secondary | ICD-10-CM | POA: Diagnosis not present

## 2018-09-27 DIAGNOSIS — I10 Essential (primary) hypertension: Secondary | ICD-10-CM | POA: Diagnosis not present

## 2018-09-27 MED ORDER — EMPAGLIFLOZIN 25 MG PO TABS: 25.0000 mg | ORAL_TABLET | Freq: Every day | ORAL | 1 refills | Status: DC

## 2018-09-27 NOTE — Progress Notes (Signed)
Patient: Judith Fox Female    DOB: 12/07/1965   53 y.o.   MRN: [id number] Visit Date: 09/27/2018  Today's Provider: Wilhemena Durie, MD   Chief Complaint  Patient presents with  . Diabetes    3 week fup   Subjective:     HPI   Diabetes Mellitus Type II, Follow-up:   Lab Results  Component Value Date   HGBA1C 9.2 (A) 09/07/2018   HGBA1C 7.5 (H) 04/28/2018   She feels well and is tolerating Jardiance at 10 mg every morning.  Blood sugars are slightly better on this dose. Last seen for diabetes 3 weeks ago.  Management since then includes was given Jardiance 10 mg daily at last visit 09/07/18 She reports good compliance with treatment. She is not having side effects.  Current symptoms include none and have been improving. Home blood sugar records: fasting range: 180 to 223  Episodes of hypoglycemia? no   Current Insulin Regimen: not on insulin Most Recent Eye Exam: 05/2018 Weight trend: stable Prior visit with dietician: no Current diet: diabetic Current exercise: housecleaning  Pertinent Labs:    Component Value Date/Time   CHOL 94 (L) 04/28/2018 0810   TRIG 75 04/28/2018 0810   HDL 47 04/28/2018 0810   LDLCALC 32 04/28/2018 0810   CREATININE 0.81 04/28/2018 0810    Wt Readings from Last 3 Encounters:  09/27/18 191 lb (86.6 kg)  09/07/18 195 lb 3.2 oz (88.5 kg)  08/31/18 196 lb 9.6 oz (89.2 kg)   After the last visit the back pain resolved spontaneously as a patient felt she passed a kidney stone just before she had the ultrasound. ------------------------------------------------------------------------   Allergies  Allergen Reactions  . Latex Rash and Swelling    Eye swelling   . Butorphanol Other (See Comments)    Other reaction(s): Other (See Comments) drowsiness somnolence   . Butorphanol Tartrate     Other reaction(s): Other (See Comments), Other (See Comments) drowsiness drowsiness somnolence   . Lactose Intolerance  (Gi) Other (See Comments)    Reflux and Indigestion     Current Outpatient Medications:  .  aspirin EC 81 MG tablet, Take 81 mg by mouth daily., Disp: , Rfl:  .  atorvastatin (LIPITOR) 40 MG tablet, Take 40 mg by mouth daily., Disp: , Rfl: 3 .  benazepril (LOTENSIN) 40 MG tablet, TAKE 1 TABLET BY MOUTH EVERY DAY, Disp: 90 tablet, Rfl: 3 .  diphenhydrAMINE (BENADRYL) 50 MG tablet, Take 0.5 tablets (25 mg total) by mouth every 8 (eight) hours as needed (migraine)., Disp: 15 tablet, Rfl: 0 .  diphenhydrAMINE (BENADRYL) 50 MG/ML injection, Inject 50 mg into the vein once., Disp: , Rfl:  .  doxycycline (PERIOSTAT) 20 MG tablet, TAKE 1 TABLET BY MOUTH TWICE A DAY, Disp: 180 tablet, Rfl: 3 .  fexofenadine (ALLEGRA) 180 MG tablet, Take 180 mg by mouth daily., Disp: , Rfl:  .  fluconazole (DIFLUCAN) 100 MG tablet, TAKE 1 TABLET BY MOUTH EACH WEEK & AS NEEDED AS DIRECTED BY PRESCRIBER, Disp: 4 tablet, Rfl: 11 .  glipiZIDE (GLUCOTROL XL) 10 MG 24 hr tablet, Take 10 mg by mouth daily., Disp: , Rfl: 0 .  glipiZIDE (GLUCOTROL) 10 MG tablet, TAKE 1 TABLET (10 MG TOTAL) BY MOUTH DAILY BEFORE BREAKFAST., Disp: 90 tablet, Rfl: 3 .  HYDROcodone-acetaminophen (NORCO/VICODIN) 5-325 MG tablet, Take 1-2 tablets by mouth every 4 (four) hours as needed for moderate pain., Disp: 35 tablet, Rfl: 0 .  ketorolac (TORADOL) 10 MG tablet, Take 1 tablet (10 mg total) by mouth every 8 (eight) hours as needed for moderate pain (with food)., Disp: 15 tablet, Rfl: 0 .  ketorolac (TORADOL) 30 MG/ML injection, INJ UTD ONCE FOR MIGRAINE, Disp: , Rfl: 5 .  loratadine (CLARITIN) 10 MG tablet, Take by mouth as needed., Disp: , Rfl:  .  metoCLOPramide (REGLAN) 5 MG/ML injection, Inject 10 mg into the vein., Disp: , Rfl:  .  Multiple Vitamin (MULTIVITAMIN) capsule, Take 1 capsule by mouth daily., Disp: , Rfl:  .  naproxen (NAPROSYN) 500 MG tablet, TAKE 1 TABLET (500 MG TOTAL) BY MOUTH 2 (TWO) TIMES DAILY WITH A MEAL., Disp: , Rfl: 3 .   nitroGLYCERIN (NITROSTAT) 0.4 MG SL tablet, Place 1 tablet (0.4 mg total) under the tongue every 5 (five) minutes as needed for chest pain., Disp: 50 tablet, Rfl: 3 .  omeprazole (PRILOSEC) 20 MG capsule, TAKE 1 CAPSULE (20 MG TOTAL) BY MOUTH 2 (TWO) TIMES DAILY BEFORE A MEAL., Disp: 180 capsule, Rfl: 3 .  ondansetron (ZOFRAN) 8 MG tablet, Take 8 mg by mouth every 8 (eight) hours as needed. for nausea, Disp: , Rfl: 5 .  prochlorperazine (COMPAZINE) 10 MG tablet, Take 1 tablet (10 mg total) by mouth every 6 (six) hours as needed for nausea or vomiting (migraine)., Disp: 15 tablet, Rfl: 0 .  promethazine (PHENERGAN) 25 MG tablet, Take 1 tablet (25 mg total) by mouth every 6 (six) hours as needed for nausea or vomiting., Disp: 35 tablet, Rfl: 0 .  HYDROcodone-homatropine (HYCODAN) 5-1.5 MG/5ML syrup, Take 5 mLs by mouth every 8 (eight) hours as needed for cough. (Patient not taking: Reported on 08/31/2018), Disp: 120 mL, Rfl: 0 .  metFORMIN (GLUCOPHAGE) 1000 MG tablet, TAKE 1 TABLET (1,000 MG TOTAL) BY MOUTH 2 (TWO) TIMES DAILY WITH A MEAL., Disp: 180 tablet, Rfl: 3  Review of Systems  Constitutional: Negative.   HENT: Negative.   Eyes: Negative.   Respiratory: Negative.   Cardiovascular: Negative.   Gastrointestinal: Negative.   Endocrine: Negative.   Genitourinary: Negative.   Musculoskeletal: Negative.   Skin: Negative.   Allergic/Immunologic: Negative.   Neurological: Negative.   Hematological: Negative.   Psychiatric/Behavioral: Negative.     Social History   Tobacco Use  . Smoking status: Never Smoker  . Smokeless tobacco: Never Used  Substance Use Topics  . Alcohol use: Yes    Alcohol/week: 2.0 standard drinks    Types: 1 Glasses of wine, 1 Shots of liquor per week    Comment: average varies, possibly once monthly      Objective:   BP 138/86 (BP Location: Right Arm, Patient Position: Sitting, Cuff Size: Normal)   Pulse 100   Temp 98.5 F (36.9 C) (Oral)   Ht 5' 9.5"  (1.765 m)   Wt 191 lb (86.6 kg)   SpO2 95%   BMI 27.80 kg/m  Vitals:   09/27/18 1130  BP: 138/86  Pulse: 100  Temp: 98.5 F (36.9 C)  TempSrc: Oral  SpO2: 95%  Weight: 191 lb (86.6 kg)  Height: 5' 9.5" (1.765 m)     Physical Exam Vitals signs reviewed.  Constitutional:      Appearance: Normal appearance. She is well-developed and normal weight.  HENT:     Head: Normocephalic.     Right Ear: External ear normal.     Left Ear: External ear normal.     Nose: Nose normal.     Mouth/Throat:  Pharynx: No oropharyngeal exudate.  Eyes:     Conjunctiva/sclera: Conjunctivae normal.     Pupils: Pupils are equal, round, and reactive to light.  Neck:     Musculoskeletal: Normal range of motion and neck supple.  Cardiovascular:     Rate and Rhythm: Normal rate and regular rhythm.     Heart sounds: Normal heart sounds.  Pulmonary:     Effort: Pulmonary effort is normal. No respiratory distress.     Breath sounds: Normal breath sounds.  Abdominal:     Palpations: Abdomen is soft.  Musculoskeletal:        General: No tenderness.  Lymphadenopathy:     Cervical: No cervical adenopathy.  Skin:    General: Skin is warm and dry.  Neurological:     General: No focal deficit present.     Mental Status: She is alert and oriented to person, place, and time. Mental status is at baseline.  Psychiatric:        Mood and Affect: Mood normal.        Behavior: Behavior normal.        Thought Content: Thought content normal.        Judgment: Judgment normal.         Assessment & Plan    1. Type 2 diabetes mellitus without complication, without long-term current use of insulin (HCC) Increase Jardiance to 25 mg every morning.  Return to clinic 3 months.  2. Mixed hyperlipidemia Treated  3. Essential hypertension Controlled.  4. Chronic migraine without aura without status migrainosus, not intractable   5. Kidney stone I feel confident the back pain the patient had was a  kidney stone.  She passed it before the ultrasound    I have done the exam and reviewed the above chart and it is accurate to the best of my knowledge. Development worker, community has been used in this note in any air is in the dictation or transcription are unintentional.  Wilhemena Durie, MD  [place]

## 2018-10-11 ENCOUNTER — Ambulatory Visit: Admit: 2018-10-11 | Payer: BLUE CROSS/BLUE SHIELD | Admitting: Internal Medicine

## 2018-10-11 SURGERY — EGD (ESOPHAGOGASTRODUODENOSCOPY)
Anesthesia: General

## 2018-10-16 ENCOUNTER — Other Ambulatory Visit: Payer: Self-pay | Admitting: Family Medicine

## 2018-10-17 ENCOUNTER — Other Ambulatory Visit: Payer: Self-pay | Admitting: Family Medicine

## 2018-12-19 ENCOUNTER — Encounter: Payer: Self-pay | Admitting: Emergency Medicine

## 2018-12-19 ENCOUNTER — Emergency Department: Payer: BLUE CROSS/BLUE SHIELD

## 2018-12-19 ENCOUNTER — Other Ambulatory Visit: Payer: Self-pay

## 2018-12-19 ENCOUNTER — Emergency Department
Admission: EM | Admit: 2018-12-19 | Discharge: 2018-12-19 | Disposition: A | Discharge status: Home / Self Care | Payer: BLUE CROSS/BLUE SHIELD | Attending: Emergency Medicine | Admitting: Emergency Medicine

## 2018-12-19 DIAGNOSIS — I252 Old myocardial infarction: Secondary | ICD-10-CM | POA: Diagnosis not present

## 2018-12-19 DIAGNOSIS — Z1159 Encounter for screening for other viral diseases: Secondary | ICD-10-CM | POA: Diagnosis not present

## 2018-12-19 DIAGNOSIS — E119 Type 2 diabetes mellitus without complications: Secondary | ICD-10-CM | POA: Insufficient documentation

## 2018-12-19 DIAGNOSIS — I10 Essential (primary) hypertension: Secondary | ICD-10-CM | POA: Insufficient documentation

## 2018-12-19 DIAGNOSIS — Z7984 Long term (current) use of oral hypoglycemic drugs: Secondary | ICD-10-CM | POA: Insufficient documentation

## 2018-12-19 DIAGNOSIS — Z7982 Long term (current) use of aspirin: Secondary | ICD-10-CM | POA: Insufficient documentation

## 2018-12-19 DIAGNOSIS — R079 Chest pain, unspecified: Secondary | ICD-10-CM

## 2018-12-19 DIAGNOSIS — Z96651 Presence of right artificial knee joint: Secondary | ICD-10-CM | POA: Insufficient documentation

## 2018-12-19 DIAGNOSIS — Z79899 Other long term (current) drug therapy: Secondary | ICD-10-CM | POA: Diagnosis not present

## 2018-12-19 LAB — CBC
HCT: 44.7 % (ref 36.0–46.0)
Hemoglobin: 14.6 g/dL (ref 12.0–15.0)
MCH: 27.8 pg (ref 26.0–34.0)
MCHC: 32.7 g/dL (ref 30.0–36.0)
MCV: 85.1 fL (ref 80.0–100.0)
Platelets: 246 10*3/uL (ref 150–400)
RBC: 5.25 MIL/uL — ABNORMAL HIGH (ref 3.87–5.11)
RDW: 12.9 % (ref 11.5–15.5)
WBC: 7.5 10*3/uL (ref 4.0–10.5)
nRBC: 0 % (ref 0.0–0.2)

## 2018-12-19 LAB — BASIC METABOLIC PANEL
Anion gap: 10 (ref 5–15)
BUN: 23 mg/dL — ABNORMAL HIGH (ref 6–20)
CO2: 22 mmol/L (ref 22–32)
Calcium: 9.6 mg/dL (ref 8.9–10.3)
Chloride: 108 mmol/L (ref 98–111)
Creatinine, Ser: 1.04 mg/dL — ABNORMAL HIGH (ref 0.44–1.00)
GFR calc Af Amer: >60 mL/min (ref >60)
GFR calc non Af Amer: >60 mL/min (ref >60)
Glucose, Bld: 115 mg/dL — ABNORMAL HIGH (ref 70–99)
Potassium: 4.3 mmol/L (ref 3.5–5.1)
Sodium: 140 mmol/L (ref 135–145)

## 2018-12-19 LAB — LIPASE, BLOOD: Lipase: 44 U/L (ref 11–51)

## 2018-12-19 LAB — HEPATIC FUNCTION PANEL
ALT: 21 U/L (ref 0–44)
AST: 26 U/L (ref 15–41)
Albumin: 4.7 g/dL (ref 3.5–5.0)
Alkaline Phosphatase: 65 U/L (ref 38–126)
Bilirubin, Direct: 0.1 mg/dL (ref 0.0–0.2)
Indirect Bilirubin: 0.4 mg/dL (ref 0.3–0.9)
Total Bilirubin: 0.5 mg/dL (ref 0.3–1.2)
Total Protein: 7.3 g/dL (ref 6.5–8.1)

## 2018-12-19 LAB — TROPONIN I: Troponin I: <0.03 ng/mL (ref <0.03)

## 2018-12-19 LAB — SARS CORONAVIRUS 2 BY RT PCR (HOSPITAL ORDER, PERFORMED IN ~~LOC~~ HOSPITAL LAB): SARS Coronavirus 2: NEGATIVE

## 2018-12-19 IMAGING — CR CHEST - 2 VIEW
1 series · 2 of 2 positions shown · non-contrast
Comparison: [DATE]

CLINICAL DATA: Chest pain.

EXAM:
CHEST - 2 VIEW

[Series 1: dg chest 2 view · 0.14mm/px · 2 of 2 slices shown]
[im 1/2]
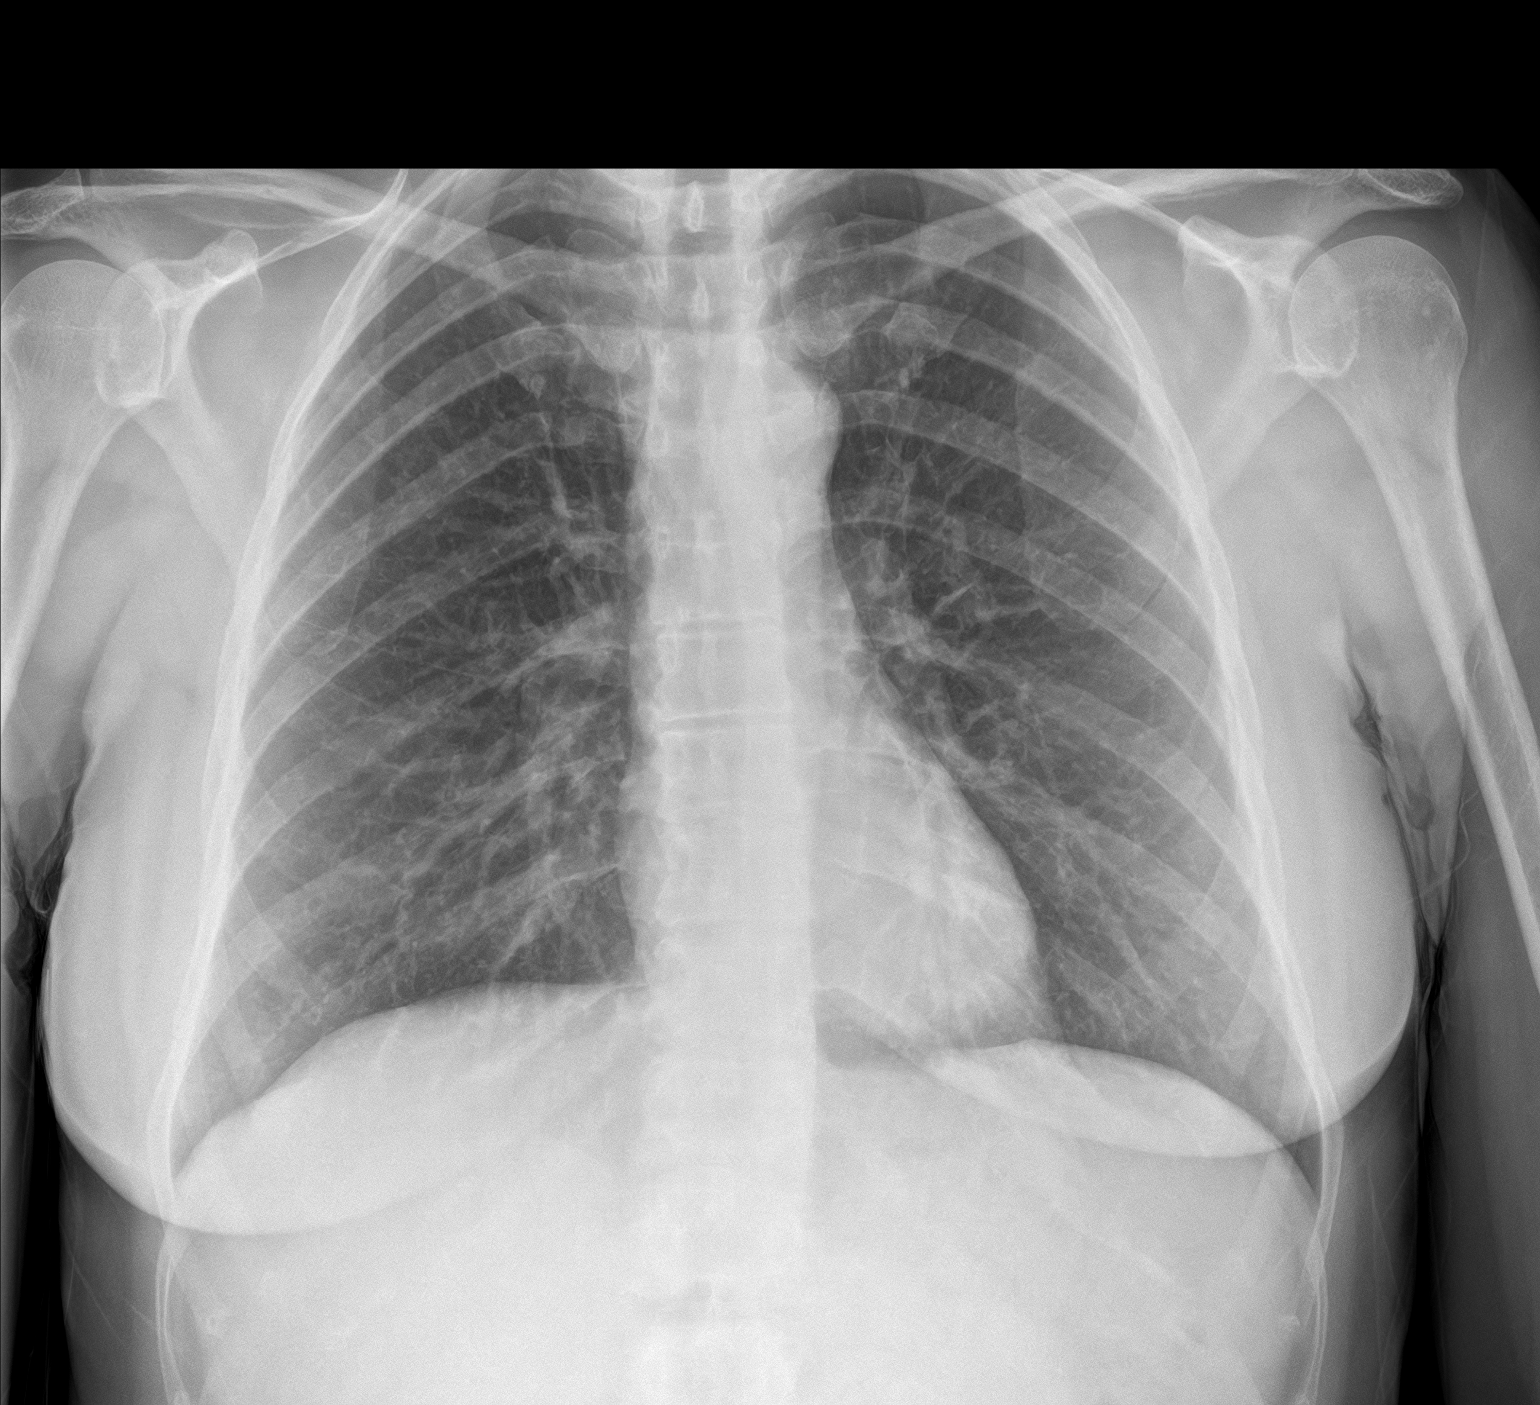
[im 2/2]
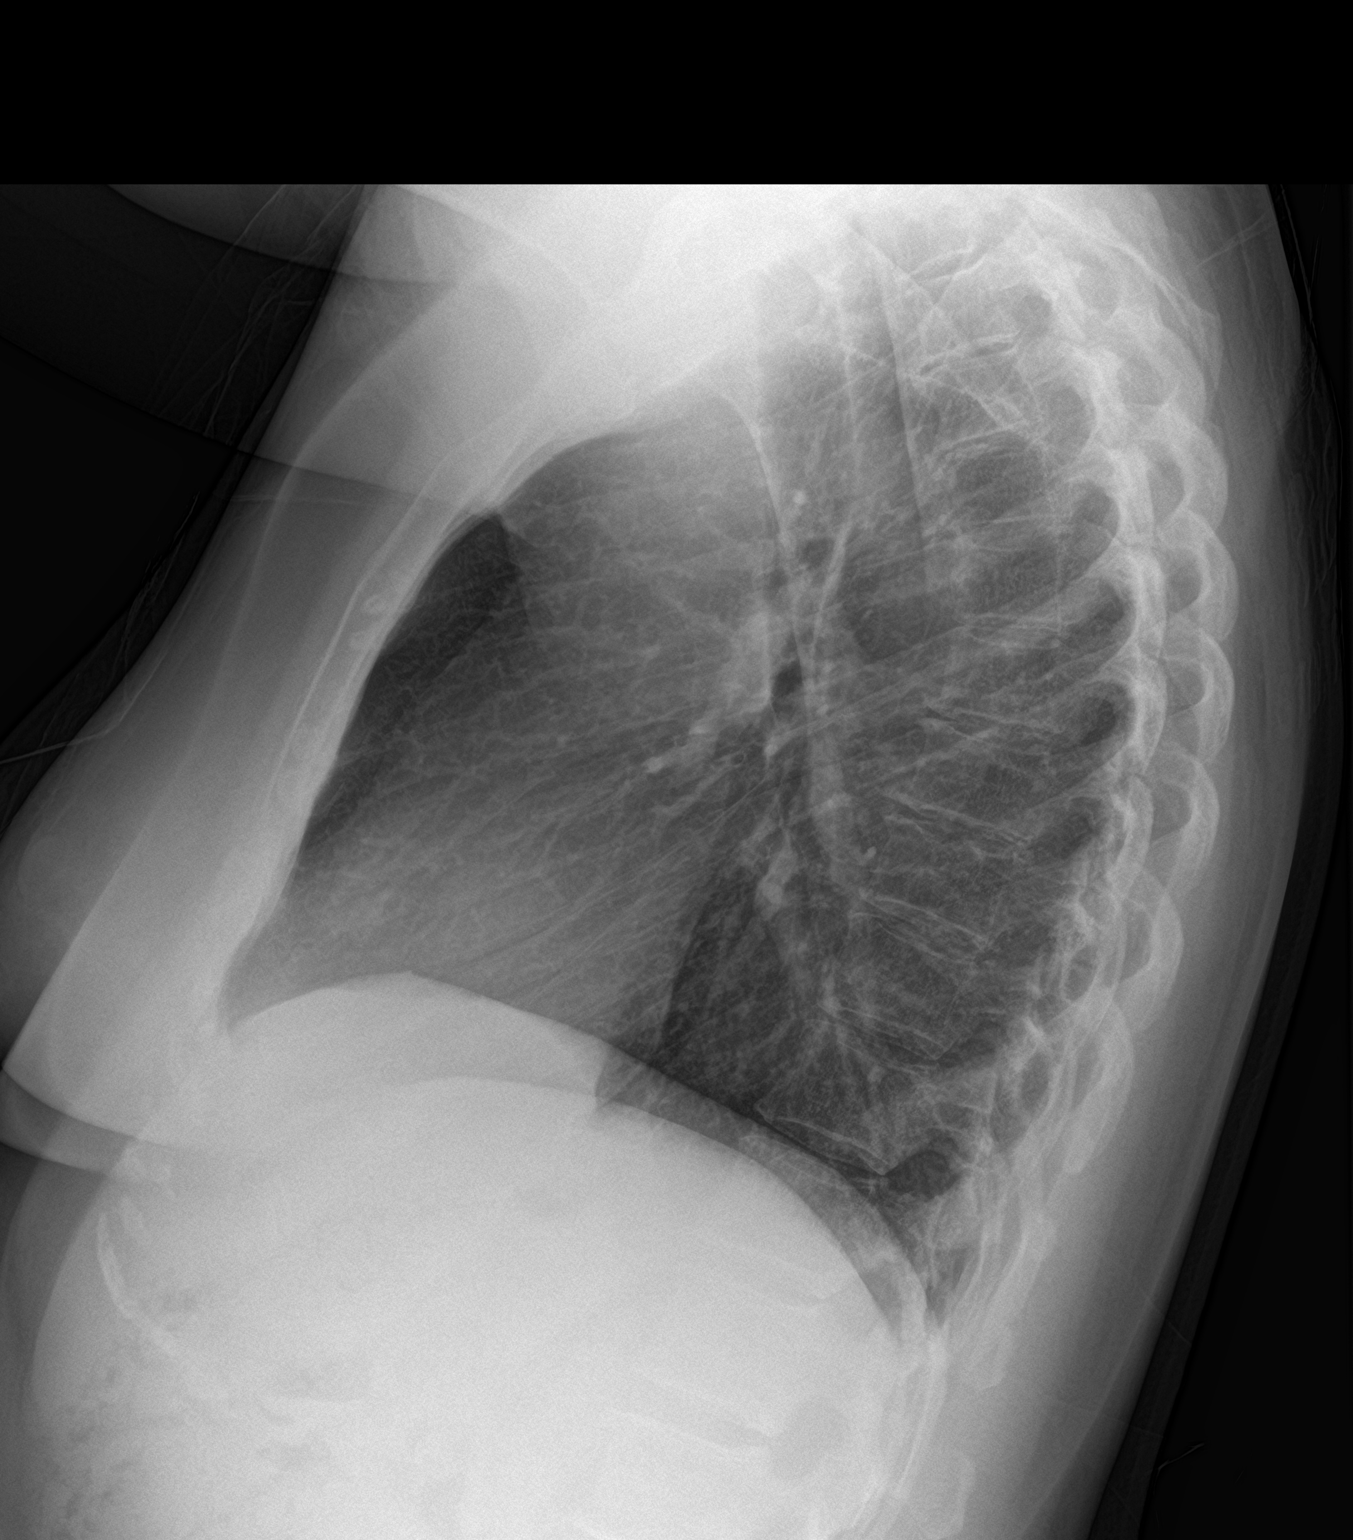

[2 of 2 positions shown; findings below may reference images not displayed]

FINDINGS: Both lungs are clear. Negative for a pneumothorax. Heart and
mediastinum are within normal limits. Trachea is midline. No large
pleural effusions. Bone structures are unremarkable. Probable nipple
shadow in the right lower chest.
IMPRESSION: No active cardiopulmonary disease.

## 2018-12-19 MED ORDER — SODIUM CHLORIDE 0.9% FLUSH: 3.0000 mL | Freq: Once | INTRAVENOUS | Status: DC

## 2018-12-19 NOTE — ED Triage Notes (Signed)
Pt here with c/o cp hx of heart attack during knee surgery approx. 6 years ago. This past Sunday, pt states she felt exhausted, had cp through the night, "pressure," nausea, heart burn, states bilateral hands swelling, light headed for the past few days, hx type 2 diabetes. Some abd pain over night as well. Denies V,D. NAD.

## 2018-12-19 NOTE — ED Provider Notes (Signed)
[facility] Emergency Department Provider Note  Time seen: 1:09 PM  I have reviewed the triage vital signs and the nursing notes.   HISTORY  Chief Complaint Chest Pain   HPI Judith Fox is a 53 y.o. female with a past medical history of diabetes, hypertension, migraines, MI 6 years ago, presents to the emergency department with symptoms of fatigue nausea and heartburn.  According to the patient over the past 2 to 3 days she has been experiencing intermittent heartburn symptoms.  Patient states she has a history of heartburn and takes Prilosec, but states over the past several days it has been worse.  States she has been feeling very fatigued and weak over the past 2 to 3 days has been experiencing chills.  Denies any abdominal pain.  Denies vomiting or diarrhea.  Describes a chest discomfort is more of a pressure or burning sensation moderate in severity but denies any at this time, states it comes and goes.  Denies any known fever.  Denies any sick contacts.  Denies any shortness of breath or cough.    Past Medical History:  Diagnosis Date  . Allergy   . Diabetes mellitus without complication ([place])   . Hypertension   . Migraine   . Myocardial infarction [facility])     Patient Active Problem List   Diagnosis Date Noted  . Varicose veins of both lower extremities with pain 05/16/2018  . Bilateral lower extremity edema 05/16/2018  . Mixed hyperlipidemia 09/05/2017  . Anxiety 06/26/2014  . Syncope and collapse 07/30/2013  . Palpitations 03/11/2013  . Diabetes ([place]) 03/01/2013  . History of non-ST elevation myocardial infarction (NSTEMI) 03/01/2013  . Essential hypertension 03/01/2013  . 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  . Chronic pain syndrome 12/27/2011  . 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 Chronic migraine without aura 10/12/2011  . Migraine without aura 10/12/2011    Past Surgical History:  Procedure Laterality Date  . JOINT  REPLACEMENT Right    knee replacement    Prior to Admission medications   Medication Sig Start Date End Date Taking? Authorizing Provider  aspirin EC 81 MG tablet Take 81 mg by mouth daily.    [provider]  atorvastatin (LIPITOR) 40 MG tablet TAKE 1 TABLET BY MOUTH EVERY DAY 10/17/18   Jerrol Banana., MD  benazepril (LOTENSIN) 40 MG tablet TAKE 1 TABLET BY MOUTH EVERY DAY 08/22/18   Jerrol Banana., MD  diphenhydrAMINE (BENADRYL) 50 MG tablet Take 0.5 tablets (25 mg total) by mouth every 8 (eight) hours as needed (migraine). 10/04/17   Eula Listen, MD  diphenhydrAMINE (BENADRYL) 50 MG/ML injection Inject 50 mg into the vein once.    [provider]  doxycycline (PERIOSTAT) 20 MG tablet TAKE 1 TABLET BY MOUTH TWICE A DAY 09/15/18   Jerrol Banana., MD  empagliflozin (JARDIANCE) 25 MG TABS tablet Take 25 mg by mouth daily. 09/27/18   Jerrol Banana., MD  fexofenadine (ALLEGRA) 180 MG tablet Take 180 mg by mouth daily.    [provider]  fluconazole (DIFLUCAN) 100 MG tablet TAKE 1 TABLET BY MOUTH EACH WEEK & AS NEEDED AS DIRECTED BY PRESCRIBER 09/18/18   Jerrol Banana., MD  fluconazole (DIFLUCAN) 150 MG tablet  08/31/18   [provider]  glipiZIDE (GLUCOTROL XL) 10 MG 24 hr tablet Take 10 mg by mouth daily. 05/20/17   [provider]  glipiZIDE (GLUCOTROL) 10 MG tablet TAKE 1  TABLET (10 MG TOTAL) BY MOUTH DAILY BEFORE BREAKFAST. 07/05/18   Jerrol Banana., MD  HYDROcodone-acetaminophen (NORCO/VICODIN) 5-325 MG tablet Take 1-2 tablets by mouth every 4 (four) hours as needed for moderate pain. 09/07/18   Jerrol Banana., MD  HYDROcodone-homatropine [facility]) 5-1.5 MG/5ML syrup Take 5 mLs by mouth every 8 (eight) hours as needed for cough. Patient not taking: Reported on 08/31/2018 08/25/18   Chrismon, Vickki Muff, PA  ketorolac (TORADOL) 10 MG tablet Take 1 tablet (10 mg total) by mouth every 8 (eight)  hours as needed for moderate pain (with food). 10/04/17   Eula Listen, MD  ketorolac (TORADOL) 30 MG/ML injection INJ UTD ONCE FOR MIGRAINE 05/19/17   [provider]  loratadine (CLARITIN) 10 MG tablet Take by mouth as needed.    [provider]  metFORMIN (GLUCOPHAGE) 1000 MG tablet TAKE 1 TABLET (1,000 MG TOTAL) BY MOUTH 2 (TWO) TIMES DAILY WITH A MEAL. 08/22/18   Jerrol Banana., MD  metoCLOPramide (REGLAN) 5 MG/ML injection Inject 10 mg into the vein.    [provider]  metoprolol tartrate (LOPRESSOR) 25 MG tablet Take by mouth.    [provider]  Multiple Vitamin (MULTIVITAMIN) capsule Take 1 capsule by mouth daily.    [provider]  Multiple Vitamins-Minerals (MULTIVITAMIN ADULT PO) Take by mouth.    [provider]  naproxen (NAPROSYN) 500 MG tablet TAKE 1 TABLET (500 MG TOTAL) BY MOUTH 2 (TWO) TIMES DAILY WITH A MEAL. 10/16/18   Jerrol Banana., MD  nitroGLYCERIN (NITROSTAT) 0.4 MG SL tablet Place 1 tablet (0.4 mg total) under the tongue every 5 (five) minutes as needed for chest pain. 08/22/17   Jerrol Banana., MD  omeprazole (PRILOSEC) 20 MG capsule TAKE 1 CAPSULE (20 MG TOTAL) BY MOUTH 2 (TWO) TIMES DAILY BEFORE A MEAL. 08/22/18   Jerrol Banana., MD  ondansetron (ZOFRAN) 8 MG tablet Take 8 mg by mouth every 8 (eight) hours as needed. for nausea 09/26/17   [provider]  polyethylene glycol-electrolytes (NULYTELY/GOLYTELY) 420 g solution Take as directed for colonic prep. 08/07/18   [provider]  prochlorperazine (COMPAZINE) 10 MG tablet Take 1 tablet (10 mg total) by mouth every 6 (six) hours as needed for nausea or vomiting (migraine). 10/04/17   Eula Listen, MD  promethazine (PHENERGAN) 25 MG tablet Take 1 tablet (25 mg total) by mouth every 6 (six) hours as needed for nausea or vomiting. 09/12/17   Jerrol Banana., MD    Allergies  Allergen Reactions  .  Latex Rash and Swelling    Eye swelling   . Butorphanol Other (See Comments)    Other reaction(s): Other (See Comments) drowsiness somnolence   . Butorphanol Tartrate     Other reaction(s): Other (See Comments), Other (See Comments) drowsiness drowsiness somnolence   . Lactose Intolerance (Gi) Other (See Comments)    Reflux and Indigestion    Family History  Problem Relation Age of Onset  . Migraines Mother   . Cancer Maternal Aunt        lung  . Cancer Maternal Grandmother        breast  . Diabetes Maternal Grandmother   . Breast cancer Maternal Grandmother November 09, 2058  . Cancer Father        died age 15 with metastatic cancer, uncertain origin  . Hypertension Brother   . Breast cancer Cousin 46  . Breast cancer Cousin 67  Social History Social History   Tobacco Use  . Smoking status: Never Smoker  . Smokeless tobacco: Never Used  Substance Use Topics  . Alcohol use: Yes    Alcohol/week: 2.0 standard drinks    Types: 1 Glasses of wine, 1 Shots of liquor per week    Comment: average varies, possibly once monthly  . Drug use: No    Review of Systems Constitutional: Negative for fever. Cardiovascular: Positive for intermittent chest discomfort x2 to 3 days Respiratory: Negative for shortness of breath.  Negative for cough Gastrointestinal: Negative for abdominal pain, vomiting Musculoskeletal: Negative for leg pain or swelling Neurological: Negative for headache All other ROS negative  ____________________________________________   PHYSICAL EXAM:  VITAL SIGNS: ED Triage Vitals  Enc Vitals Group     BP 12/19/18 1205 (!) 141/108     Pulse Rate 12/19/18 1205 95     Resp 12/19/18 1205 16     Temp 12/19/18 1205 98.2 F (36.8 C)     Temp Source 12/19/18 1205 Oral     SpO2 12/19/18 1205 98 %     Weight 12/19/18 1206 190 lb (86.2 kg)     Height 12/19/18 1206 5\' 9"  (1.753 m)     Head Circumference --      Peak Flow --      Pain Score 12/19/18 1206 6     Pain  Loc --      Pain Edu? --      Excl. in [place]? --    Constitutional: Alert and oriented. Well appearing and in no distress. Eyes: Normal exam ENT      Head: Normocephalic and atraumatic      Mouth/Throat: Mucous membranes are moist. Cardiovascular: Normal rate, regular rhythm.  Respiratory: Normal respiratory effort without tachypnea nor retractions. Breath sounds are clear  Gastrointestinal: Soft and nontender. No distention.   Musculoskeletal: Nontender with normal range of motion in all extremities. Neurologic:  Normal speech and language. No gross focal neurologic deficits  Skin:  Skin is warm, dry and intact.  Psychiatric: Mood and affect are normal.   ____________________________________________    EKG  EKG viewed and interpreted by myself shows a normal sinus rhythm at 97 bpm with a narrow QRS, normal axis, normal intervals, nonspecific ST changes.  ____________________________________________    RADIOLOGY  Chest x-ray is clear.  ____________________________________________   INITIAL IMPRESSION / ASSESSMENT AND PLAN / ED COURSE  Pertinent labs & imaging results that were available during my care of the patient were reviewed by me and considered in my medical decision making (see chart for details).   Patient presents to the emergency department for intermittent heartburn/chest pressure symptoms over the past 2 to 3 days along with weakness and chills.  Overall the patient appears well, no acute distress.  Clear lung sounds normal heart sounds, nontender abdomen.  We will check labs, chest x-ray and continue to closely monitor.  EKG shows nonspecific changes but no significant abnormality.  We will also obtain a corona swab as a precaution.  Patient agreeable to plan of care.  Patient's work-up is overall reassuring.  X-ray is normal.  Labs normal including LFTs, lipase and troponin.  Suspect more reflux symptoms.  However given the patient's history I recommended she  follow-up with her cardiologist as soon as possible.  Judith Fox was evaluated in Emergency Department on 12/19/2018 for the symptoms described in the history of present illness. She was evaluated in the context of the [place] COVID-19  pandemic, which necessitated consideration that the patient might be at risk for infection with the SARS-CoV-2 virus that causes COVID-19. Institutional protocols and algorithms that pertain to the evaluation of patients at risk for COVID-19 are in a state of rapid change based on information released by regulatory bodies including the CDC and federal and state organizations. These policies and algorithms were followed during the patient's care in the ED.  ____________________________________________   FINAL CLINICAL IMPRESSION(S) / ED DIAGNOSES  Chest pain   Harvest Dark, MD 12/19/18 1449

## 2018-12-19 NOTE — ED Notes (Signed)
Pt diaphoretic in triage 

## 2018-12-19 NOTE — ED Notes (Signed)
Patient transported to X-ray 

## 2018-12-25 ENCOUNTER — Other Ambulatory Visit: Payer: Self-pay

## 2018-12-25 ENCOUNTER — Telehealth: Payer: Self-pay | Admitting: Family Medicine

## 2018-12-25 ENCOUNTER — Encounter: Payer: Self-pay | Admitting: Podiatry

## 2018-12-25 ENCOUNTER — Ambulatory Visit: Payer: BLUE CROSS/BLUE SHIELD | Admitting: Podiatry

## 2018-12-25 VITALS — Temp 98.5°F

## 2018-12-25 DIAGNOSIS — L84 Corns and callosities: Secondary | ICD-10-CM | POA: Diagnosis not present

## 2018-12-25 DIAGNOSIS — R234 Changes in skin texture: Secondary | ICD-10-CM | POA: Diagnosis not present

## 2018-12-25 DIAGNOSIS — M2011 Hallux valgus (acquired), right foot: Secondary | ICD-10-CM

## 2018-12-25 DIAGNOSIS — M2012 Hallux valgus (acquired), left foot: Secondary | ICD-10-CM

## 2018-12-25 NOTE — Progress Notes (Signed)
This patient presents to the office with chief complaint of callus right foot  and diabetic feet.  This patient  says there  is   pain and discomfort in her right  Foot due to fissure..  This patient says this fizzure is painful especially when in bed.    Patient has no history of infection or drainage from both feet.  . This patient presents  to the office today for examination of fissure  and a foot evaluation due to history of  diabetes.  General Appearance  Alert, conversant and in no acute stress.  Vascular  Dorsalis pedis and posterior tibial  pulses are palpable  bilaterally.  Capillary return is within normal limits  bilaterally. Temperature is within normal limits  bilaterally.  Neurologic  Senn-Weinstein monofilament wire test within normal limits  bilaterally. Muscle power within normal limits bilaterally.  Nails Thick disfigured discolored nails with subungual debris  from hallux to fifth toes bilaterally. No evidence of bacterial infection or drainage bilaterally.  Orthopedic  No limitations of motion of motion feet .  No crepitus or effusions noted.  No bony pathology or digital deformities noted.  Skin  normotropic skin with no porokeratosis noted bilaterally.  No signs of infections or ulcers noted.   Callus sub 1 right foot with transverse fissure.  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  Told patient this developed due to her bunion.    A diabetic foot exam was performed and there is no evidence of any vascular or neurologic pathology.   RTC prn.  To consider insoles and possible surgery for additional treatment.   Gardiner Barefoot DPM

## 2018-12-25 NOTE — Telephone Encounter (Signed)
Please review and let me know if this is something we can just go ahead and change for her or if someone would need to see her.

## 2018-12-25 NOTE — Telephone Encounter (Signed)
Pt wants to change the Jardiance to something else.  She says the  Jardiance cause her to have bad yeast infection.  She was going to see Dr. Rosanna Randy this week and Discuss it with him.  She cant wait two weeks to change medications  CB#  [phone]

## 2018-12-26 ENCOUNTER — Other Ambulatory Visit: Payer: Self-pay | Admitting: Family Medicine

## 2018-12-26 ENCOUNTER — Ambulatory Visit: Payer: Self-pay | Admitting: Family Medicine

## 2018-12-26 DIAGNOSIS — B379 Candidiasis, unspecified: Secondary | ICD-10-CM

## 2018-12-26 MED ORDER — FLUCONAZOLE 100 MG PO TABS: 100.0000 mg | ORAL_TABLET | Freq: Every day | ORAL | 0 refills | Status: DC

## 2018-12-26 NOTE — Telephone Encounter (Signed)
Patient is having more vaginal yeast infections since starting the Jardiance. Wants a refill of the Diflucan to take once a day until her recheck appointment on 01-08-19 with Dr. Rosanna Randy. Continues to take the Meftormin 1000 mg BID and Glipizide 10 mg BID. With her history of NSTEMI, she may not be a candidate for a DPP-4. She seems to be willing to consider a GLP-1 (like Trulicity) if Diflucan shows any liver function stress.

## 2019-01-08 ENCOUNTER — Ambulatory Visit: Payer: Self-pay | Admitting: Family Medicine

## 2019-01-18 ENCOUNTER — Ambulatory Visit (INDEPENDENT_AMBULATORY_CARE_PROVIDER_SITE_OTHER): Payer: BLUE CROSS/BLUE SHIELD | Admitting: Family Medicine

## 2019-01-18 ENCOUNTER — Encounter: Payer: Self-pay | Admitting: Family Medicine

## 2019-01-18 ENCOUNTER — Other Ambulatory Visit: Payer: Self-pay

## 2019-01-18 VITALS — BP 126/74 | HR 84 | Temp 98.1°F | Resp 16 | Ht 69.0 in | Wt 192.0 lb

## 2019-01-18 DIAGNOSIS — Z23 Encounter for immunization: Secondary | ICD-10-CM

## 2019-01-18 DIAGNOSIS — Z1211 Encounter for screening for malignant neoplasm of colon: Secondary | ICD-10-CM

## 2019-01-18 DIAGNOSIS — E119 Type 2 diabetes mellitus without complications: Secondary | ICD-10-CM | POA: Diagnosis not present

## 2019-01-18 LAB — POCT GLYCOSYLATED HEMOGLOBIN (HGB A1C): Hemoglobin A1C: 7.5 % — AB (ref 4.0–5.6)

## 2019-01-18 MED ORDER — RYBELSUS 3 MG PO TABS: 1.0000 | ORAL_TABLET | Freq: Every day | ORAL | 0 refills | Status: DC

## 2019-01-18 NOTE — Progress Notes (Signed)
Patient: Judith Fox Female    DOB: 1966-06-02   53 y.o.   MRN: [id number] Visit Date: 01/18/2019  Today's Provider: Wilhemena Durie, MD   Chief Complaint  Patient presents with  . Diabetes   Subjective:     HPI Patient comes in today for a follow up. She was seen in the office about 3 months ago and was started on Jardiance. She reports that it controlled her blood sugars, but it gave her yeast infections. She is wanting to start something different because of this.  Lab Results  Component Value Date   HGBA1C 9.2 (A) 09/07/2018   Wt Readings from Last 3 Encounters:  01/18/19 192 lb (87.1 kg)  12/19/18 190 lb (86.2 kg)  09/27/18 191 lb (86.6 kg)   BP Readings from Last 3 Encounters:  01/18/19 126/74  12/19/18 (!) 142/92  09/27/18 138/86    Allergies  Allergen Reactions  . Latex Rash and Swelling    Eye swelling   . Butorphanol Other (See Comments)    Other reaction(s): Other (See Comments) drowsiness somnolence   . Butorphanol Tartrate     Other reaction(s): Other (See Comments), Other (See Comments) drowsiness drowsiness somnolence   . Lactose Intolerance (Gi) Other (See Comments)    Reflux and Indigestion     Current Outpatient Medications:  .  acidophilus (RISAQUAD) CAPS capsule, Take 1 capsule by mouth daily., Disp: , Rfl:  .  atorvastatin (LIPITOR) 40 MG tablet, TAKE 1 TABLET BY MOUTH EVERY DAY (Patient taking differently: Take 40 mg by mouth daily. ), Disp: 90 tablet, Rfl: 3 .  benazepril (LOTENSIN) 40 MG tablet, TAKE 1 TABLET BY MOUTH EVERY DAY (Patient taking differently: Take 40 mg by mouth daily. ), Disp: 90 tablet, Rfl: 3 .  diphenhydrAMINE (BENADRYL) 50 MG/ML injection, Inject 50 mg into the vein daily as needed (migraine). , Disp: , Rfl:  .  fexofenadine (ALLEGRA) 180 MG tablet, Take 180 mg by mouth daily as needed for allergies. , Disp: , Rfl:  .  fluconazole (DIFLUCAN) 100 MG tablet, Take 1 tablet (100 mg total) by mouth  daily., Disp: 14 tablet, Rfl: 0 .  glipiZIDE (GLUCOTROL) 10 MG tablet, TAKE 1 TABLET (10 MG TOTAL) BY MOUTH DAILY BEFORE BREAKFAST., Disp: 90 tablet, Rfl: 3 .  loratadine (CLARITIN) 10 MG tablet, Take 10 mg by mouth daily as needed for allergies. , Disp: , Rfl:  .  metFORMIN (GLUCOPHAGE) 1000 MG tablet, TAKE 1 TABLET (1,000 MG TOTAL) BY MOUTH 2 (TWO) TIMES DAILY WITH A MEAL., Disp: 180 tablet, Rfl: 3 .  Multiple Vitamin (MULTIVITAMIN) capsule, Take 1 capsule by mouth daily., Disp: , Rfl:  .  omeprazole (PRILOSEC) 20 MG capsule, TAKE 1 CAPSULE (20 MG TOTAL) BY MOUTH 2 (TWO) TIMES DAILY BEFORE A MEAL., Disp: 180 capsule, Rfl: 3 .  aspirin EC 81 MG tablet, Take 81 mg by mouth daily., Disp: , Rfl:  .  diphenhydrAMINE (BENADRYL) 50 MG tablet, Take 0.5 tablets (25 mg total) by mouth every 8 (eight) hours as needed (migraine). (Patient not taking: Reported on 12/19/2018), Disp: 15 tablet, Rfl: 0 .  doxycycline (PERIOSTAT) 20 MG tablet, TAKE 1 TABLET BY MOUTH TWICE A DAY (Patient taking differently: Take 20 mg by mouth 2 (two) times daily. ), Disp: 180 tablet, Rfl: 3 .  empagliflozin (JARDIANCE) 25 MG TABS tablet, Take 25 mg by mouth daily. (Patient not taking: Reported on 01/18/2019), Disp: 90 tablet, Rfl: 1 .  ketorolac (TORADOL) 10 MG tablet, Take 1 tablet (10 mg total) by mouth every 8 (eight) hours as needed for moderate pain (with food). (Patient not taking: Reported on 12/19/2018), Disp: 15 tablet, Rfl: 0 .  ketorolac (TORADOL) 30 MG/ML injection, Inject 30 mg into the vein daily as needed (migraine). , Disp: , Rfl: 5 .  metoCLOPramide (REGLAN) 5 MG/ML injection, Inject 10 mg into the vein daily as needed (migraine). , Disp: , Rfl:  .  naproxen (NAPROSYN) 500 MG tablet, TAKE 1 TABLET (500 MG TOTAL) BY MOUTH 2 (TWO) TIMES DAILY WITH A MEAL. (Patient taking differently: Take 500 mg by mouth 2 (two) times daily with a meal. ), Disp: 180 tablet, Rfl: 2 .  nitroGLYCERIN (NITROSTAT) 0.4 MG SL tablet, Place 1  tablet (0.4 mg total) under the tongue every 5 (five) minutes as needed for chest pain., Disp: 50 tablet, Rfl: 3 .  prochlorperazine (COMPAZINE) 10 MG tablet, Take 1 tablet (10 mg total) by mouth every 6 (six) hours as needed for nausea or vomiting (migraine). (Patient not taking: Reported on 12/19/2018), Disp: 15 tablet, Rfl: 0 .  promethazine (PHENERGAN) 25 MG tablet, Take 1 tablet (25 mg total) by mouth every 6 (six) hours as needed for nausea or vomiting. (Patient not taking: Reported on 12/19/2018), Disp: 35 tablet, Rfl: 0  Review of Systems  Constitutional: Negative for activity change, diaphoresis and fever.  HENT: Negative.   Eyes: Negative.   Respiratory: Negative for cough and shortness of breath.   Cardiovascular: Negative for chest pain, palpitations and leg swelling.  Endocrine: Negative for cold intolerance, heat intolerance, polydipsia, polyphagia and polyuria.  Genitourinary: Positive for dyspareunia, vaginal discharge and vaginal pain.  Allergic/Immunologic: Negative.   Neurological: Negative for dizziness.  Psychiatric/Behavioral: Negative.     Social History   Tobacco Use  . Smoking status: Never Smoker  . Smokeless tobacco: Never Used  Substance Use Topics  . Alcohol use: Yes    Alcohol/week: 2.0 standard drinks    Types: 1 Glasses of wine, 1 Shots of liquor per week    Comment: average varies, possibly once monthly      Objective:   BP 126/74   Pulse 84   Temp 98.1 F (36.7 C)   Resp 16   Ht 5\' 9"  (1.753 m)   Wt 192 lb (87.1 kg)   SpO2 99%   BMI 28.35 kg/m  Vitals:   01/18/19 1623  BP: 126/74  Pulse: 84  Resp: 16  Temp: 98.1 F (36.7 C)  SpO2: 99%  Weight: 192 lb (87.1 kg)  Height: 5\' 9"  (1.753 m)     Physical Exam Vitals signs reviewed.  Constitutional:      Appearance: Normal appearance. She is well-developed and normal weight.  HENT:     Head: Normocephalic.     Right Ear: External ear normal.     Left Ear: External ear normal.      Nose: Nose normal.     Mouth/Throat:     Pharynx: No oropharyngeal exudate.  Eyes:     Conjunctiva/sclera: Conjunctivae normal.     Pupils: Pupils are equal, round, and reactive to light.  Neck:     Musculoskeletal: Normal range of motion and neck supple.  Cardiovascular:     Rate and Rhythm: Normal rate and regular rhythm.     Heart sounds: Normal heart sounds.  Pulmonary:     Effort: Pulmonary effort is normal. No respiratory distress.     Breath sounds: Normal breath  sounds.  Abdominal:     Palpations: Abdomen is soft.  Musculoskeletal:        General: No tenderness.  Lymphadenopathy:     Cervical: No cervical adenopathy.  Skin:    General: Skin is warm and dry.  Neurological:     General: No focal deficit present.     Mental Status: She is alert and oriented to person, place, and time. Mental status is at baseline.  Psychiatric:        Mood and Affect: Mood normal.        Behavior: Behavior normal.        Thought Content: Thought content normal.        Judgment: Judgment normal.         Assessment & Plan    1. Type 2 diabetes mellitus without complication, without long-term current use of insulin ([place]) After discussion with patient will discontinue Jardiance and start Rybesus  3 mg daily.  Return to clinic 1 month.   - POCT glycosylated hemoglobin (Hb A1C) - Semaglutide (RYBELSUS) 3 MG TABS; Take 1 tablet by mouth daily.  Dispense: 30 tablet; Refill: 0  2. Need for vaccine for Td (tetanus-diphtheria)   3. Colon cancer screening  - Ambulatory referral to Gastroenterology    I have done the exam and reviewed the above chart and it is accurate to the best of my knowledge. Development worker, community has been used in this note in any air is in the dictation or transcription are unintentional.  Wilhemena Durie, MD  [facility]

## 2019-01-20 ENCOUNTER — Other Ambulatory Visit: Payer: Self-pay | Admitting: Family Medicine

## 2019-02-05 ENCOUNTER — Ambulatory Visit (INDEPENDENT_AMBULATORY_CARE_PROVIDER_SITE_OTHER): Payer: BLUE CROSS/BLUE SHIELD | Admitting: Family Medicine

## 2019-02-05 ENCOUNTER — Encounter: Payer: Self-pay | Admitting: Family Medicine

## 2019-02-05 ENCOUNTER — Other Ambulatory Visit: Payer: Self-pay

## 2019-02-05 VITALS — BP 156/100 | HR 88 | Temp 97.8°F | Wt 195.4 lb

## 2019-02-05 DIAGNOSIS — L255 Unspecified contact dermatitis due to plants, except food: Secondary | ICD-10-CM | POA: Diagnosis not present

## 2019-02-05 MED ORDER — TRIAMCINOLONE ACETONIDE 0.1 % EX CREA: 1.0000 "application " | TOPICAL_CREAM | Freq: Two times a day (BID) | CUTANEOUS | 0 refills | Status: DC

## 2019-02-05 MED ORDER — METHYLPREDNISOLONE ACETATE 40 MG/ML IJ SUSP
40.0000 mg | Freq: Once | INTRAMUSCULAR | Status: AC
  Administered 2019-02-05: 40 mg via INTRAMUSCULAR

## 2019-02-05 NOTE — Progress Notes (Signed)
Patient: Judith Fox Female    DOB: [birth year]   53 y.o.   MRN: [id number] Visit Date: 02/05/2019  Today's Provider: Vernie Murders, PA   Chief Complaint  Patient presents with  . Rash   Subjective:     Rash The current episode started in the past 7 days. The problem has been gradually worsening since onset. The affected locations include the left fingers, left upper leg, genitalia, right upper leg and right fingers. The rash is characterized by blistering, redness and dryness. She was exposed to plant contact. Past treatments include antihistamine and anti-itch cream. The treatment provided no relief.   Past Medical History:  Diagnosis Date  . Allergy   . Diabetes mellitus without complication ([place])   . Hypertension   . Migraine   . Myocardial infarction [facility])    Past Surgical History:  Procedure Laterality Date  . JOINT REPLACEMENT Right    knee replacement   Family History  Problem Relation Age of Onset  . Migraines Mother   . Cancer Maternal Aunt        lung  . Cancer Maternal Grandmother        breast  . Diabetes Maternal Grandmother   . Breast cancer Maternal Grandmother 2058-11-07  . Cancer Father        died age 31 with metastatic cancer, uncertain origin  . Hypertension Brother   . Breast cancer Cousin 64  . Breast cancer Cousin 50   Allergies  Allergen Reactions  . Latex Rash and Swelling    Eye swelling   . Butorphanol Other (See Comments)    Other reaction(s): Other (See Comments) drowsiness somnolence   . Butorphanol Tartrate     Other reaction(s): Other (See Comments), Other (See Comments) drowsiness drowsiness somnolence   . Lactose Intolerance (Gi) Other (See Comments)    Reflux and Indigestion    Current Outpatient Medications:  .  acidophilus (RISAQUAD) CAPS capsule, Take 1 capsule by mouth daily., Disp: , Rfl:  .  aspirin EC 81 MG tablet, Take 81 mg by mouth daily., Disp: , Rfl:  .  atorvastatin (LIPITOR) 40 MG tablet,  TAKE 1 TABLET BY MOUTH EVERY DAY (Patient taking differently: Take 40 mg by mouth daily. ), Disp: 90 tablet, Rfl: 3 .  benazepril (LOTENSIN) 40 MG tablet, TAKE 1 TABLET BY MOUTH EVERY DAY (Patient taking differently: Take 40 mg by mouth daily. ), Disp: 90 tablet, Rfl: 3 .  diphenhydrAMINE (BENADRYL) 50 MG/ML injection, Inject 50 mg into the vein daily as needed (migraine). , Disp: , Rfl:  .  doxycycline (PERIOSTAT) 20 MG tablet, TAKE 1 TABLET BY MOUTH TWICE A DAY (Patient taking differently: Take 20 mg by mouth 2 (two) times daily. ), Disp: 180 tablet, Rfl: 3 .  fexofenadine (ALLEGRA) 180 MG tablet, Take 180 mg by mouth daily as needed for allergies. , Disp: , Rfl:  .  fluconazole (DIFLUCAN) 100 MG tablet, Take 1 tablet (100 mg total) by mouth daily., Disp: 14 tablet, Rfl: 0 .  glipiZIDE (GLUCOTROL) 10 MG tablet, TAKE 1 TABLET (10 MG TOTAL) BY MOUTH DAILY BEFORE BREAKFAST., Disp: 90 tablet, Rfl: 3 .  ketorolac (TORADOL) 30 MG/ML injection, Inject 30 mg into the vein daily as needed (migraine). , Disp: , Rfl: 5 .  loratadine (CLARITIN) 10 MG tablet, Take 10 mg by mouth daily as needed for allergies. , Disp: , Rfl:  .  metFORMIN (GLUCOPHAGE) 1000 MG tablet, TAKE 1 TABLET (1,000  MG TOTAL) BY MOUTH 2 (TWO) TIMES DAILY WITH A MEAL., Disp: 180 tablet, Rfl: 3 .  metoCLOPramide (REGLAN) 5 MG/ML injection, Inject 10 mg into the vein daily as needed (migraine). , Disp: , Rfl:  .  Multiple Vitamin (MULTIVITAMIN) capsule, Take 1 capsule by mouth daily., Disp: , Rfl:  .  naproxen (NAPROSYN) 500 MG tablet, TAKE 1 TABLET (500 MG TOTAL) BY MOUTH 2 (TWO) TIMES DAILY WITH A MEAL. (Patient taking differently: Take 500 mg by mouth 2 (two) times daily with a meal. ), Disp: 180 tablet, Rfl: 2 .  nitroGLYCERIN (NITROSTAT) 0.4 MG SL tablet, Place 1 tablet (0.4 mg total) under the tongue every 5 (five) minutes as needed for chest pain., Disp: 50 tablet, Rfl: 3 .  omeprazole (PRILOSEC) 20 MG capsule, TAKE 1 CAPSULE (20 MG  TOTAL) BY MOUTH 2 (TWO) TIMES DAILY BEFORE A MEAL., Disp: 180 capsule, Rfl: 3 .  Semaglutide (RYBELSUS) 3 MG TABS, Take 1 tablet by mouth daily., Disp: 30 tablet, Rfl: 0 .  diphenhydrAMINE (BENADRYL) 50 MG tablet, Take 0.5 tablets (25 mg total) by mouth every 8 (eight) hours as needed (migraine). (Patient not taking: Reported on 12/19/2018), Disp: 15 tablet, Rfl: 0 .  empagliflozin (JARDIANCE) 25 MG TABS tablet, Take 25 mg by mouth daily. (Patient not taking: Reported on 01/18/2019), Disp: 90 tablet, Rfl: 1 .  ketorolac (TORADOL) 10 MG tablet, Take 1 tablet (10 mg total) by mouth every 8 (eight) hours as needed for moderate pain (with food). (Patient not taking: Reported on 12/19/2018), Disp: 15 tablet, Rfl: 0 .  prochlorperazine (COMPAZINE) 10 MG tablet, Take 1 tablet (10 mg total) by mouth every 6 (six) hours as needed for nausea or vomiting (migraine). (Patient not taking: Reported on 12/19/2018), Disp: 15 tablet, Rfl: 0 .  promethazine (PHENERGAN) 25 MG tablet, Take 1 tablet (25 mg total) by mouth every 6 (six) hours as needed for nausea or vomiting. (Patient not taking: Reported on 02/05/2019), Disp: 35 tablet, Rfl: 0  Review of Systems  Constitutional: Negative.   Respiratory: Negative.   Genitourinary: Negative.   Skin: Positive for rash.  Neurological: Negative.    Social History   Tobacco Use  . Smoking status: Never Smoker  . Smokeless tobacco: Never Used  Substance Use Topics  . Alcohol use: Yes    Alcohol/week: 2.0 standard drinks    Types: 1 Glasses of wine, 1 Shots of liquor per week    Comment: average varies, possibly once monthly     Objective:   BP (!) 156/100 (BP Location: Left Arm, Patient Position: Sitting, Cuff Size: Normal)   Pulse 88   Temp 97.8 F (36.6 C) (Oral)   Wt 195 lb 6.4 oz (88.6 kg)   SpO2 97%   BMI 28.86 kg/m  Vitals:   02/05/19 0905  BP: (!) 156/100  Pulse: 88  Temp: 97.8 F (36.6 C)  TempSrc: Oral  SpO2: 97%  Weight: 195 lb 6.4 oz (88.6 kg)    Physical Exam Constitutional:      General: She is not in acute distress.    Appearance: She is well-developed.  HENT:     Head: Normocephalic and atraumatic.     Right Ear: Hearing normal.     Left Ear: Hearing normal.     Nose: Nose normal.  Eyes:     General: Lids are normal. No scleral icterus.       Right eye: No discharge.        Left eye: No  discharge.     Conjunctiva/sclera: Conjunctivae normal.  Pulmonary:     Effort: Pulmonary effort is normal. No respiratory distress.  Musculoskeletal: Normal range of motion.  Skin:    Findings: Rash present. No lesion.  Neurological:     Mental Status: She is alert and oriented to person, place, and time.  Psychiatric:        Speech: Speech normal.        Behavior: Behavior normal.        Thought Content: Thought content normal.       Assessment & Plan    1. Rhus dermatitis Pruritic vesicular rash scattered on legs, genitals, left side of face and between left 3rd and 4th fingers over the past 3-4 days. Unsure when or where she contracted it, but is sure it is from poison ivy. Will give Depo-Medrol injection and may use Triamcinolone cream. Recheck if no better in a week. BP elevated with itching. Recheck BP at pharmacy and call report of BP response to salt/caffeine restrictions. - triamcinolone cream (KENALOG) 0.1 %; Apply 1 application topically 2 (two) times daily.  Dispense: 30 g; Refill: 0 - methylPREDNISolone acetate (DEPO-MEDROL) injection 40 mg     Vernie Murders, PA  [facility]

## 2019-02-05 NOTE — Patient Instructions (Signed)
Poison Ivy Dermatitis Poison ivy dermatitis is inflammation of the skin that is caused by chemicals in the leaves of the poison ivy plant. The skin reaction often involves redness, swelling, blisters, and extreme itching. What are the causes? This condition is caused by a chemical (urushiol) found in the sap of the poison ivy plant. This chemical is sticky and can be easily spread to people, animals, and objects. You can get poison ivy dermatitis by:  Having direct contact with a poison ivy plant.  Touching animals, other people, or objects that have come in contact with poison ivy and have the chemical on them. What increases the risk? This condition is more likely to develop in people who:  Are outdoors often in wooded or marshy areas.  Go outdoors without wearing protective clothing, such as closed shoes, long pants, and a long-sleeved shirt. What are the signs or symptoms? Symptoms of this condition include:  Redness of the skin.  Extreme itching.  A rash that often includes bumps and blisters. The rash usually appears 48 hours after exposure, if you have been exposed before. If this is the first time you have been exposed, the rash may not appear until a week after exposure.  Swelling. This may occur if the reaction is more severe. Symptoms usually last for 1-2 weeks. However, the first time you develop this condition, symptoms may last 3-4 weeks. How is this diagnosed? This condition may be diagnosed based on your symptoms and a physical exam. Your health care provider may also ask you about any recent outdoor activity. How is this treated? Treatment for this condition will vary depending on how severe it is. Treatment may include:  Hydrocortisone cream or calamine lotion to relieve itching.  Oatmeal baths to soothe the skin.  Medicines, such as over-the-counter antihistamine tablets.  Oral steroid medicine, for more severe reactions. Follow these instructions at home:  Medicines  Take or apply over-the-counter and prescription medicines only as told by your health care provider.  Use hydrocortisone cream or calamine lotion as needed to soothe the skin and relieve itching. General instructions  Do not scratch or rub your skin.  Apply a cold, wet cloth (cold compress) to the affected areas or take baths in cool water. This will help with itching. Avoid hot baths and showers.  Take oatmeal baths as needed. Use colloidal oatmeal. You can get this at your local pharmacy or grocery store. Follow the instructions on the packaging.  While you have the rash, wash clothes right after you wear them.  Keep all follow-up visits as told by your health care provider. This is important. How is this prevented?   Learn to identify the poison ivy plant and avoid contact with the plant. This plant can be recognized by the number of leaves. Generally, poison ivy has three leaves with flowering branches on a single stem. The leaves are typically glossy, and they have jagged edges that come to a point at the front.  If you have been exposed to poison ivy, thoroughly wash with soap and water right away. You have about 30 minutes to remove the plant resin before it will cause the rash. Be sure to wash under your fingernails, because any plant resin there will continue to spread the rash.  When hiking or camping, wear clothes that will help you to avoid exposure on the skin. This includes long pants, a long-sleeved shirt, tall socks, and hiking boots. You can also apply preventive lotion to your skin to   help limit exposure.  If you suspect that your clothes or outdoor gear came in contact with poison ivy, rinse them off outside with a garden hose before you bring them inside your house.  When doing yard work or gardening, wear gloves, long sleeves, long pants, and boots. Wash your garden tools and gloves if they come in contact with poison ivy.  If you suspect that your pet  has come into contact with poison ivy, wash him or her with pet shampoo and water. Make sure to wear gloves while washing your pet. Contact a health care provider if you have:  Open sores in the rash area.  More redness, swelling, or pain in the affected area.  Redness that spreads beyond the rash area.  Fluid, blood, or pus coming from the affected area.  A fever.  A rash over a large area of your body.  A rash on your eyes, mouth, or genitals.  A rash that does not improve after a few weeks. Get help right away if:  Your face swells or your eyes swell shut.  You have trouble breathing.  You have trouble swallowing. These symptoms may represent a serious problem that is an emergency. Do not wait to see if the symptoms will go away. Get medical help right away. Call your local emergency services (911 in the [place] Do not drive yourself to the hospital. Summary  Poison ivy dermatitis is inflammation of the skin that is caused by chemicals in the leaves of the poison ivy plant.  Symptoms of this condition include redness, itching, a rash, and swelling.  Do not scratch or rub your skin.  Take or apply over-the-counter and prescription medicines only as told by your health care provider. This information is not intended to replace advice given to you by your health care provider. Make sure you discuss any questions you have with your health care provider. Document Released: 07/23/2000 Document Revised: 11/17/2018 Document Reviewed: 07/21/2018 [employer]vier Patient Education  2020 [employer]  

## 2019-02-12 ENCOUNTER — Other Ambulatory Visit: Payer: Self-pay

## 2019-02-12 ENCOUNTER — Ambulatory Visit (INDEPENDENT_AMBULATORY_CARE_PROVIDER_SITE_OTHER): Payer: BLUE CROSS/BLUE SHIELD | Admitting: Family Medicine

## 2019-02-12 ENCOUNTER — Encounter: Payer: Self-pay | Admitting: Family Medicine

## 2019-02-12 VITALS — BP 122/82 | HR 89 | Temp 98.2°F | Resp 18 | Wt 193.8 lb

## 2019-02-12 DIAGNOSIS — L255 Unspecified contact dermatitis due to plants, except food: Secondary | ICD-10-CM | POA: Diagnosis not present

## 2019-02-12 DIAGNOSIS — E119 Type 2 diabetes mellitus without complications: Secondary | ICD-10-CM

## 2019-02-12 MED ORDER — RYBELSUS 3 MG PO TABS: 1.0000 | ORAL_TABLET | Freq: Every day | ORAL | 0 refills | Status: DC

## 2019-02-12 MED ORDER — PREDNISONE 10 MG PO TABS: ORAL_TABLET | ORAL | 0 refills | Status: DC

## 2019-02-12 NOTE — Progress Notes (Signed)
Patient: Judith Fox Female    DOB: 1966/03/02   53 y.o.   MRN: [id number] Visit Date: 02/12/2019  Today's Provider: Vernie Murders, PA   No chief complaint on file.  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 She is using the cream that was prescribed but it is not helping.  Areas are burning and itching, she is unable to get any sleep due to the rash. Patient states that she needs a refill on Rybelsus until her next appointment.  Past Medical History:  Diagnosis Date  . Allergy   . Diabetes mellitus without complication ([place])   . Hypertension   . Migraine   . Myocardial infarction [facility])    Past Surgical History:  Procedure Laterality Date  . JOINT REPLACEMENT Right    knee replacement   Family History  Problem Relation Age of Onset  . Migraines Mother   . Cancer Maternal Aunt        lung  . Cancer Maternal Grandmother        breast  . Diabetes Maternal Grandmother   . Breast cancer Maternal Grandmother 11/12/2058  . Cancer Father        died age 6 with metastatic cancer, uncertain origin  . Hypertension Brother   . Breast cancer Cousin 59  . Breast cancer Cousin 50   Allergies  Allergen Reactions  . Latex Rash and Swelling    Eye swelling   . Butorphanol Other (See Comments)    Other reaction(s): Other (See Comments) drowsiness somnolence   . Butorphanol Tartrate     Other reaction(s): Other (See Comments), Other (See Comments) drowsiness drowsiness somnolence   . Lactose Intolerance (Gi) Other (See Comments)    Reflux and Indigestion    Current Outpatient Medications:  .  acidophilus (RISAQUAD) CAPS capsule, Take 1 capsule by mouth daily., Disp: , Rfl:  .  aspirin EC 81 MG tablet, Take 81 mg by mouth daily., Disp: , Rfl:  .  atorvastatin (LIPITOR) 40 MG tablet, TAKE 1 TABLET BY MOUTH EVERY DAY (Patient taking differently: Take 40 mg by mouth daily. ), Disp: 90 tablet, Rfl: 3 .  benazepril (LOTENSIN)  40 MG tablet, TAKE 1 TABLET BY MOUTH EVERY DAY (Patient taking differently: Take 40 mg by mouth daily. ), Disp: 90 tablet, Rfl: 3 .  diphenhydrAMINE (BENADRYL) 50 MG tablet, Take 0.5 tablets (25 mg total) by mouth every 8 (eight) hours as needed (migraine). (Patient not taking: Reported on 12/19/2018), Disp: 15 tablet, Rfl: 0 .  diphenhydrAMINE (BENADRYL) 50 MG/ML injection, Inject 50 mg into the vein daily as needed (migraine). , Disp: , Rfl:  .  doxycycline (PERIOSTAT) 20 MG tablet, TAKE 1 TABLET BY MOUTH TWICE A DAY (Patient taking differently: Take 20 mg by mouth 2 (two) times daily. ), Disp: 180 tablet, Rfl: 3 .  empagliflozin (JARDIANCE) 25 MG TABS tablet, Take 25 mg by mouth daily. (Patient not taking: Reported on 01/18/2019), Disp: 90 tablet, Rfl: 1 .  fexofenadine (ALLEGRA) 180 MG tablet, Take 180 mg by mouth daily as needed for allergies. , Disp: , Rfl:  .  fluconazole (DIFLUCAN) 100 MG tablet, Take 1 tablet (100 mg total) by mouth daily., Disp: 14 tablet, Rfl: 0 .  glipiZIDE (GLUCOTROL) 10 MG tablet, TAKE 1 TABLET (10 MG TOTAL) BY MOUTH DAILY BEFORE BREAKFAST., Disp: 90 tablet, Rfl: 3 .  ketorolac (TORADOL) 10 MG tablet, Take 1  tablet (10 mg total) by mouth every 8 (eight) hours as needed for moderate pain (with food). (Patient not taking: Reported on 12/19/2018), Disp: 15 tablet, Rfl: 0 .  ketorolac (TORADOL) 30 MG/ML injection, Inject 30 mg into the vein daily as needed (migraine). , Disp: , Rfl: 5 .  loratadine (CLARITIN) 10 MG tablet, Take 10 mg by mouth daily as needed for allergies. , Disp: , Rfl:  .  metFORMIN (GLUCOPHAGE) 1000 MG tablet, TAKE 1 TABLET (1,000 MG TOTAL) BY MOUTH 2 (TWO) TIMES DAILY WITH A MEAL., Disp: 180 tablet, Rfl: 3 .  metoCLOPramide (REGLAN) 5 MG/ML injection, Inject 10 mg into the vein daily as needed (migraine). , Disp: , Rfl:  .  Multiple Vitamin (MULTIVITAMIN) capsule, Take 1 capsule by mouth daily., Disp: , Rfl:  .  naproxen (NAPROSYN) 500 MG tablet, TAKE 1  TABLET (500 MG TOTAL) BY MOUTH 2 (TWO) TIMES DAILY WITH A MEAL. (Patient taking differently: Take 500 mg by mouth 2 (two) times daily with a meal. ), Disp: 180 tablet, Rfl: 2 .  nitroGLYCERIN (NITROSTAT) 0.4 MG SL tablet, Place 1 tablet (0.4 mg total) under the tongue every 5 (five) minutes as needed for chest pain., Disp: 50 tablet, Rfl: 3 .  omeprazole (PRILOSEC) 20 MG capsule, TAKE 1 CAPSULE (20 MG TOTAL) BY MOUTH 2 (TWO) TIMES DAILY BEFORE A MEAL., Disp: 180 capsule, Rfl: 3 .  prochlorperazine (COMPAZINE) 10 MG tablet, Take 1 tablet (10 mg total) by mouth every 6 (six) hours as needed for nausea or vomiting (migraine). (Patient not taking: Reported on 12/19/2018), Disp: 15 tablet, Rfl: 0 .  promethazine (PHENERGAN) 25 MG tablet, Take 1 tablet (25 mg total) by mouth every 6 (six) hours as needed for nausea or vomiting. (Patient not taking: Reported on 02/05/2019), Disp: 35 tablet, Rfl: 0 .  Semaglutide (RYBELSUS) 3 MG TABS, Take 1 tablet by mouth daily., Disp: 30 tablet, Rfl: 0 .  triamcinolone cream (KENALOG) 0.1 %, Apply 1 application topically 2 (two) times daily., Disp: 30 g, Rfl: 0  Review of Systems  Skin: Positive for rash.  All other systems reviewed and are negative.  Social History   Tobacco Use  . Smoking status: Never Smoker  . Smokeless tobacco: Never Used  Substance Use Topics  . Alcohol use: Yes    Alcohol/week: 2.0 standard drinks    Types: 1 Glasses of wine, 1 Shots of liquor per week    Comment: average varies, possibly once monthly     Objective:   BP 122/82 (BP Location: Right Arm, Patient Position: Sitting, Cuff Size: Large)   Pulse 89   Temp 98.2 F (36.8 C) (Oral)   Resp 18   Wt 193 lb 12.8 oz (87.9 kg)   SpO2 97%   BMI 28.62 kg/m  Vitals:   02/12/19 1328  BP: 122/82  Pulse: 89  Resp: 18  Temp: 98.2 F (36.8 C)  TempSrc: Oral  SpO2: 97%  Weight: 193 lb 12.8 oz (87.9 kg)   Physical Exam Constitutional:      General: She is not in acute distress.     Appearance: She is well-developed.  HENT:     Head: Normocephalic and atraumatic.     Right Ear: Hearing normal.     Left Ear: Hearing normal.     Nose: Nose normal.  Eyes:     General: Lids are normal. No scleral icterus.       Right eye: No discharge.  Left eye: No discharge.     Conjunctiva/sclera: Conjunctivae normal.  Pulmonary:     Effort: Pulmonary effort is normal. No respiratory distress.  Musculoskeletal: Normal range of motion.  Skin:    Findings: Rash present. No lesion.     Comments: Pruritic scattered lesions (<0.5 cm each) on left foot, left neck and right forearm. Some lesions in genitals. Large patch on left side of face and left foot.  Neurological:     Mental Status: She is alert and oriented to person, place, and time.  Psychiatric:        Speech: Speech normal.        Behavior: Behavior normal.        Thought Content: Thought content normal.       Assessment & Plan    1. Rhus dermatitis Slowly improving pruritic rash since getting the Depo-Medrol 40 mg injection. Feels she is getting more rash to appear and not aware of new exposure other than new dogs. Has washed dogs but noticing new lesions appearing on the left foot, right forearm and left neck. Requests prednisone taper and will continue Triamcinolone cream TID applied sparingly to lesions. Recheck prn. - predniSONE (DELTASONE) 10 MG tablet; Taper down by mouth dose by one tablet daily starting at 6 divided between meals (6,5,4,3,2,1)  Dispense: 21 tablet; Refill: 0  2. Type 2 diabetes mellitus without complication, without long-term current use of insulin (HCC) Feels blood sugar has been in better control with the use of Rybelsus daily. Ran out of it and needs refill to maintain dosage until her follow up appointment with Dr. Rosanna Randy on 02-22-19.. - Semaglutide (RYBELSUS) 3 MG TABS; Take 1 tablet by mouth daily.  Dispense: 30 tablet; Refill: [place], PA  [facility]

## 2019-02-19 ENCOUNTER — Other Ambulatory Visit: Payer: Self-pay

## 2019-02-19 ENCOUNTER — Encounter: Payer: Self-pay | Admitting: Physician Assistant

## 2019-02-19 ENCOUNTER — Ambulatory Visit (INDEPENDENT_AMBULATORY_CARE_PROVIDER_SITE_OTHER): Payer: BLUE CROSS/BLUE SHIELD | Admitting: Physician Assistant

## 2019-02-19 VITALS — BP 154/102 | HR 92 | Temp 98.2°F | Resp 16 | Wt 192.4 lb

## 2019-02-19 DIAGNOSIS — M25552 Pain in left hip: Secondary | ICD-10-CM | POA: Diagnosis not present

## 2019-02-19 MED ORDER — KETOROLAC TROMETHAMINE 60 MG/2ML IM SOLN
60.0000 mg | Freq: Once | INTRAMUSCULAR | Status: AC
  Administered 2019-02-19: 60 mg via INTRAMUSCULAR

## 2019-02-19 NOTE — Progress Notes (Signed)
Patient: Judith Fox Female    DOB: 03/14/1966   53 y.o.   MRN: [id number] Visit Date: 02/19/2019  Today's Provider: Mar Daring, PA-C   Chief Complaint  Patient presents with  . Hip Pain   Subjective:     Hip Pain  The incident occurred more than 1 week ago. There was no injury mechanism. The pain is present in the left hip (Groin area). The quality of the pain is described as aching. The pain is at a severity of 10/10. The pain is severe. The pain has been fluctuating since onset. Associated symptoms include an inability to bear weight and muscle weakness. Pertinent negatives include no numbness or tingling. The symptoms are aggravated by movement and weight bearing. She has tried NSAIDs (High dose of IBU and using the cane sometimes) for the symptoms. The treatment provided no relief.   Of note: Patient recently had steroid injection for poison oak dermatitis and then had to f/u with a steroid taper to completely resolve. There is family history of hip OA requiring hip replacement surgery.  Allergies  Allergen Reactions  . Latex Rash and Swelling    Eye swelling   . Butorphanol Other (See Comments)    Other reaction(s): Other (See Comments) drowsiness somnolence   . Butorphanol Tartrate     Other reaction(s): Other (See Comments), Other (See Comments) drowsiness drowsiness somnolence   . Lactose Intolerance (Gi) Other (See Comments)    Reflux and Indigestion     Current Outpatient Medications:  .  acidophilus (RISAQUAD) CAPS capsule, Take 1 capsule by mouth daily., Disp: , Rfl:  .  aspirin EC 81 MG tablet, Take 81 mg by mouth daily., Disp: , Rfl:  .  atorvastatin (LIPITOR) 40 MG tablet, TAKE 1 TABLET BY MOUTH EVERY DAY (Patient taking differently: Take 40 mg by mouth daily. ), Disp: 90 tablet, Rfl: 3 .  benazepril (LOTENSIN) 40 MG tablet, TAKE 1 TABLET BY MOUTH EVERY DAY (Patient taking differently: Take 40 mg by mouth daily. ), Disp: 90  tablet, Rfl: 3 .  diphenhydrAMINE (BENADRYL) 50 MG/ML injection, Inject 50 mg into the vein daily as needed (migraine). , Disp: , Rfl:  .  doxycycline (PERIOSTAT) 20 MG tablet, TAKE 1 TABLET BY MOUTH TWICE A DAY (Patient taking differently: Take 20 mg by mouth 2 (two) times daily. ), Disp: 180 tablet, Rfl: 3 .  fexofenadine (ALLEGRA) 180 MG tablet, Take 180 mg by mouth daily as needed for allergies. , Disp: , Rfl:  .  fluconazole (DIFLUCAN) 100 MG tablet, Take 1 tablet (100 mg total) by mouth daily., Disp: 14 tablet, Rfl: 0 .  glipiZIDE (GLUCOTROL) 10 MG tablet, TAKE 1 TABLET (10 MG TOTAL) BY MOUTH DAILY BEFORE BREAKFAST., Disp: 90 tablet, Rfl: 3 .  loratadine (CLARITIN) 10 MG tablet, Take 10 mg by mouth daily as needed for allergies. , Disp: , Rfl:  .  metFORMIN (GLUCOPHAGE) 1000 MG tablet, TAKE 1 TABLET (1,000 MG TOTAL) BY MOUTH 2 (TWO) TIMES DAILY WITH A MEAL., Disp: 180 tablet, Rfl: 3 .  metoCLOPramide (REGLAN) 5 MG/ML injection, Inject 10 mg into the vein daily as needed (migraine). , Disp: , Rfl:  .  Multiple Vitamin (MULTIVITAMIN) capsule, Take 1 capsule by mouth daily., Disp: , Rfl:  .  naproxen (NAPROSYN) 500 MG tablet, TAKE 1 TABLET (500 MG TOTAL) BY MOUTH 2 (TWO) TIMES DAILY WITH A MEAL. (Patient taking differently: Take 500 mg by mouth 2 (two) times  daily with a meal. ), Disp: 180 tablet, Rfl: 2 .  nitroGLYCERIN (NITROSTAT) 0.4 MG SL tablet, Place 1 tablet (0.4 mg total) under the tongue every 5 (five) minutes as needed for chest pain., Disp: 50 tablet, Rfl: 3 .  omeprazole (PRILOSEC) 20 MG capsule, TAKE 1 CAPSULE (20 MG TOTAL) BY MOUTH 2 (TWO) TIMES DAILY BEFORE A MEAL., Disp: 180 capsule, Rfl: 3 .  Semaglutide (RYBELSUS) 3 MG TABS, Take 1 tablet by mouth daily., Disp: 30 tablet, Rfl: 0 .  triamcinolone cream (KENALOG) 0.1 %, Apply 1 application topically 2 (two) times daily., Disp: 30 g, Rfl: 0 .  diphenhydrAMINE (BENADRYL) 50 MG tablet, Take 0.5 tablets (25 mg total) by mouth every 8  (eight) hours as needed (migraine). (Patient not taking: Reported on 12/19/2018), Disp: 15 tablet, Rfl: 0 .  empagliflozin (JARDIANCE) 25 MG TABS tablet, Take 25 mg by mouth daily. (Patient not taking: Reported on 01/18/2019), Disp: 90 tablet, Rfl: 1 .  ketorolac (TORADOL) 10 MG tablet, Take 1 tablet (10 mg total) by mouth every 8 (eight) hours as needed for moderate pain (with food). (Patient not taking: Reported on 12/19/2018), Disp: 15 tablet, Rfl: 0 .  ketorolac (TORADOL) 30 MG/ML injection, Inject 30 mg into the vein daily as needed (migraine). , Disp: , Rfl: 5 .  prochlorperazine (COMPAZINE) 10 MG tablet, Take 1 tablet (10 mg total) by mouth every 6 (six) hours as needed for nausea or vomiting (migraine). (Patient not taking: Reported on 12/19/2018), Disp: 15 tablet, Rfl: 0 .  promethazine (PHENERGAN) 25 MG tablet, Take 1 tablet (25 mg total) by mouth every 6 (six) hours as needed for nausea or vomiting. (Patient not taking: Reported on 02/05/2019), Disp: 35 tablet, Rfl: 0  Review of Systems  Constitutional: Negative.   Respiratory: Negative.   Cardiovascular: Negative.   Gastrointestinal: Negative.   Genitourinary: Negative.   Musculoskeletal: Positive for arthralgias and gait problem. Negative for joint swelling.  Neurological: Positive for weakness. Negative for tingling and numbness.    Social History   Tobacco Use  . Smoking status: Never Smoker  . Smokeless tobacco: Never Used  Substance Use Topics  . Alcohol use: Yes    Alcohol/week: 2.0 standard drinks    Types: 1 Glasses of wine, 1 Shots of liquor per week    Comment: average varies, possibly once monthly      Objective:   BP (!) 154/102 (BP Location: Left Arm, Patient Position: Sitting, Cuff Size: Normal)   Pulse 92   Temp 98.2 F (36.8 C) (Oral)   Resp 16   Wt 192 lb 6.4 oz (87.3 kg)   BMI 28.41 kg/m  Vitals:   02/19/19 0942  BP: (!) 154/102  Pulse: 92  Resp: 16  Temp: 98.2 F (36.8 C)  TempSrc: Oral   Weight: 192 lb 6.4 oz (87.3 kg)     Physical Exam Vitals signs reviewed.  Constitutional:      General: She is not in acute distress.    Appearance: Normal appearance. She is well-developed. She is not ill-appearing or diaphoretic.  Neck:     Musculoskeletal: Normal range of motion and neck supple.     Thyroid: No thyromegaly.     Vascular: No JVD.     Trachea: No tracheal deviation.  Cardiovascular:     Rate and Rhythm: Normal rate and regular rhythm.     Heart sounds: Normal heart sounds. No murmur. No friction rub. No gallop.   Pulmonary:     Effort:  Pulmonary effort is normal. No respiratory distress.     Breath sounds: Normal breath sounds. No wheezing or rales.  Musculoskeletal:     Right hip: Normal.     Left hip: She exhibits decreased range of motion, decreased strength and tenderness. She exhibits no bony tenderness and no crepitus.     Right lower leg: No edema.     Left lower leg: No edema.  Lymphadenopathy:     Cervical: No cervical adenopathy.  Neurological:     Mental Status: She is alert.     Gait: Gait abnormal (antalgic).     No results found for any visits on 02/19/19.     Assessment & Plan    1. 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 Will get imaging as below and f/u pending results. WIll give Toradol IM today for pain relief. Continue IBU 800mg  TID prn. Ice and heat prn. Epsom salt soaks in bath tub can help. Call if worsening.  - ketorolac (TORADOL) injection 60 mg - DG Hip Unilat W OR W/O Pelvis 2-3 Views Left; Future     Mar Daring, PA-C  [facility]

## 2019-02-19 NOTE — Patient Instructions (Addendum)
Hip Pain  The hip is the joint between the upper legs and the lower pelvis. The bones, cartilage, tendons, and muscles of your hip joint support your body and allow you to move around. Hip pain can range from a minor ache to severe pain in one or both of your hips. The pain may be felt on the inside of the hip joint near the groin, or the outside near the buttocks and upper thigh. You may also have swelling or stiffness. Follow these instructions at home: Managing pain, stiffness, and swelling  If directed, apply ice to the injured area. ? Put ice in a plastic bag. ? Place a towel between your skin and the bag. ? Leave the ice on for 20 minutes, 2-3 times a day  Sleep with a pillow between your legs on your most comfortable side.  Avoid any activities that cause pain. General instructions  Take over-the-counter and prescription medicines only as told by your health care provider.  Do any exercises as told by your health care provider.  Record the following: ? How often you have hip pain. ? The location of your pain. ? What the pain feels like. ? What makes the pain worse.  Keep all follow-up visits as told by your health care provider. This is important. Contact a health care provider if:  You cannot put weight on your leg.  Your pain or swelling continues or gets worse after one week.  It gets harder to walk.  You have a fever. Get help right away if:  You fall.  You have a sudden increase in pain and swelling in your hip.  Your hip is red or swollen or very tender to touch. Summary  Hip pain can range from a minor ache to severe pain in one or both of your hips.  The pain may be felt on the inside of the hip joint near the groin, or the outside near the buttocks and upper thigh.  Avoid any activities that cause pain.  Record how often you have hip pain, the location of the pain, what makes it worse and what it feels like. This information is not intended to  replace advice given to you by your health care provider. Make sure you discuss any questions you have with your health care provider. Document Released: 01/13/2010 Document Revised: 07/08/2017 Document Reviewed: 06/28/2016 Elsevier Patient Education  2020 [employer].   Avascular Necrosis Avascular necrosis is death of bone tissue due to lack of blood supply. This condition may also be called:  Osteonecrosis.  Aseptic necrosis.  Ischemic bone necrosis. Without proper blood supply, the internal layer of the affected bone dies. Over time, small breaks form in the outer layer of the bone. If this process affects a bone near a joint, the joint may collapse or move out of place (become dislocated). Joints that may be affected include the jaw, wrist, fingers, knee, and foot. Avascular necrosis most commonly affects:  The hip joint, especially the top of the thigh bone (femoral head).  The top of the upper arm bone (humeral head). What are the causes? This condition may be caused by:  Damage or injury to a bone or joint.  Using steroid medicine such as prednisone for a long time.  Changes in the body's disease-fighting system (immune system).  Changes in the body's system of chemicals that regulate body processes (hormones).  A lot of exposure to radiation. What increases the risk? The following factors may make you more  likely to develop this condition:  Alcohol abuse.  A history of joint injury.  Long-term or frequent use of steroid medicines.  Having a medical condition such as: ? HIV or AIDS. ? Diabetes. ? Sickle cell disease. ? A disease in which your body's immune system attacks your body's own healthy tissues (autoimmune disease). What are the signs or symptoms? The main symptoms of avascular necrosis are:  Pain. If an affected joint collapses, the pain may suddenly get severe.  Decreased ability to move the affected bone or joint. How is this diagnosed?  Avascular necrosis may be diagnosed based on:  Your symptoms and medical history.  A physical exam.  Imaging tests, such as X-rays, bone scans, and an MRI. How is this treated? Treatment for this condition may include:  Pain medicine, such as NSAIDs.  Medicine to improve bone growth.  Avoiding placing any pressure or weight on the affected area. If avascular necrosis occurs in your hip, ankle, or foot, you may need to use a device to help you move around (assistive device), such as crutches or a rolling scooter.  Physical therapy to help regain strength and motion in the affected area.  Surgery, such as: ? Core decompression. In this surgery, one or more holes are placed in the bone for new blood vessels to grow into. This provides a renewed blood supply to the bone. This surgery may reduce pain and pressure in the affected bone and slow the destruction of bones and joints. ? Osteotomy. In this surgery, the bone is reshaped to reduce stress on the affected area of the joint. ? Bone grafting. In this surgery, healthy bone from a different part of your body is used to replace damaged bone. ? Total joint replacement (arthroplasty). In this surgery, the affected surfaces of bone on one or both sides of a joint are replaced with artificial parts (prostheses).  Electrical stimulation (also called e-stim). During this procedure, a probe over the skin sends shock waves into the body. This may help to encourage new bone growth.  High-pressure oxygen therapy (hyperbaric oxygen). This is rarely used. Follow these instructions at home: Activity  Ask your health care provider what activities are safe for you.  If physical therapy was prescribed, do exercises as told by your health care provider.  Avoid placing any pressure or weight on the affected area, as told by your health care provider. Use assistive devices as instructed. Managing pain, stiffness, and swelling  If directed, apply heat  to the affected area as often as told by your health care provider. Heat can reduce the stiffness of your muscles and joints. Use the heat source that your health care provider recommends, such as a moist heat pack or a heating pad. ? Place a towel between your skin and the heat source. ? Leave the heat on for 20-30 minutes. ? Remove the heat if your skin turns bright red. This is especially important if you are unable to feel pain, heat, or cold. You may have a greater risk of getting burned.  If directed, put ice on affected areas. Icing can help to relieve joint pain and swelling. ? Put ice in a plastic bag. ? Place a towel between your skin and the bag. ? Leave the ice on for 20 minutes, 2-3 times a day. General instructions   Take over-the-counter and prescription medicines only as told by your health care provider.  Do not drive or use heavy machinery while taking prescription pain medicine.  If a bone in your arm or leg is affected, ask your health care provider if it is safe for you to drive.  Do not use any products that contain nicotine or tobacco, such as cigarettes and e-cigarettes. These can delay bone healing. If you need help quitting, ask your health care provider.  Do not drink alcohol.  Keep all follow-up visits as told by your health care provider. This is important. Contact a health care provider if:  You have pain that gets worse or does not get better with medicine.  Your ability to move your joint gets worse. Get help right away if:  Your pain suddenly becomes severe. Summary  Avascular necrosis is death of bone tissue due to a lack of blood supply.  Without proper blood supply, the internal layer of the affected bone dies. Over time, small breaks form in the outer layer of the bone.  Avoid putting any pressure or weight on the affected area. You may need to use a device to help you move around (assistive device), such as crutches or a rolling scooter.  This information is not intended to replace advice given to you by your health care provider. Make sure you discuss any questions you have with your health care provider. Document Released: 01/15/2002 Document Revised: 07/08/2017 Document Reviewed: 07/05/2017 Elsevier Patient Education  2020 [employer].

## 2019-02-20 ENCOUNTER — Ambulatory Visit
Admission: RE | Admit: 2019-02-20 | Discharge: 2019-02-20 | Disposition: A | Discharge status: Home / Self Care | Payer: BLUE CROSS/BLUE SHIELD | Source: Ambulatory Visit | Attending: Physician Assistant | Admitting: Physician Assistant

## 2019-02-20 ENCOUNTER — Telehealth: Payer: Self-pay | Admitting: Physician Assistant

## 2019-02-20 ENCOUNTER — Ambulatory Visit
Admission: RE | Admit: 2019-02-20 | Discharge: 2019-02-20 | Disposition: A | Discharge status: Home / Self Care | Payer: BLUE CROSS/BLUE SHIELD | Attending: Physician Assistant | Admitting: Physician Assistant

## 2019-02-20 DIAGNOSIS — M25552 Pain in left hip: Secondary | ICD-10-CM

## 2019-02-20 IMAGING — CR DG HIP (WITH OR WITHOUT PELVIS) 2-3V LEFT
1 series · 3 of 3 positions shown · non-contrast
Comparison: None.

CLINICAL DATA: Acute left hip pain without known injury.

EXAM:
DG HIP (WITH OR WITHOUT PELVIS) 2-3V LEFT

[Series 1: dg hip unilat w or w/o pelvis 2-3 views  · non-contrast · 0.14mm/px · 3 of 3 slices shown]
[im 1/3]
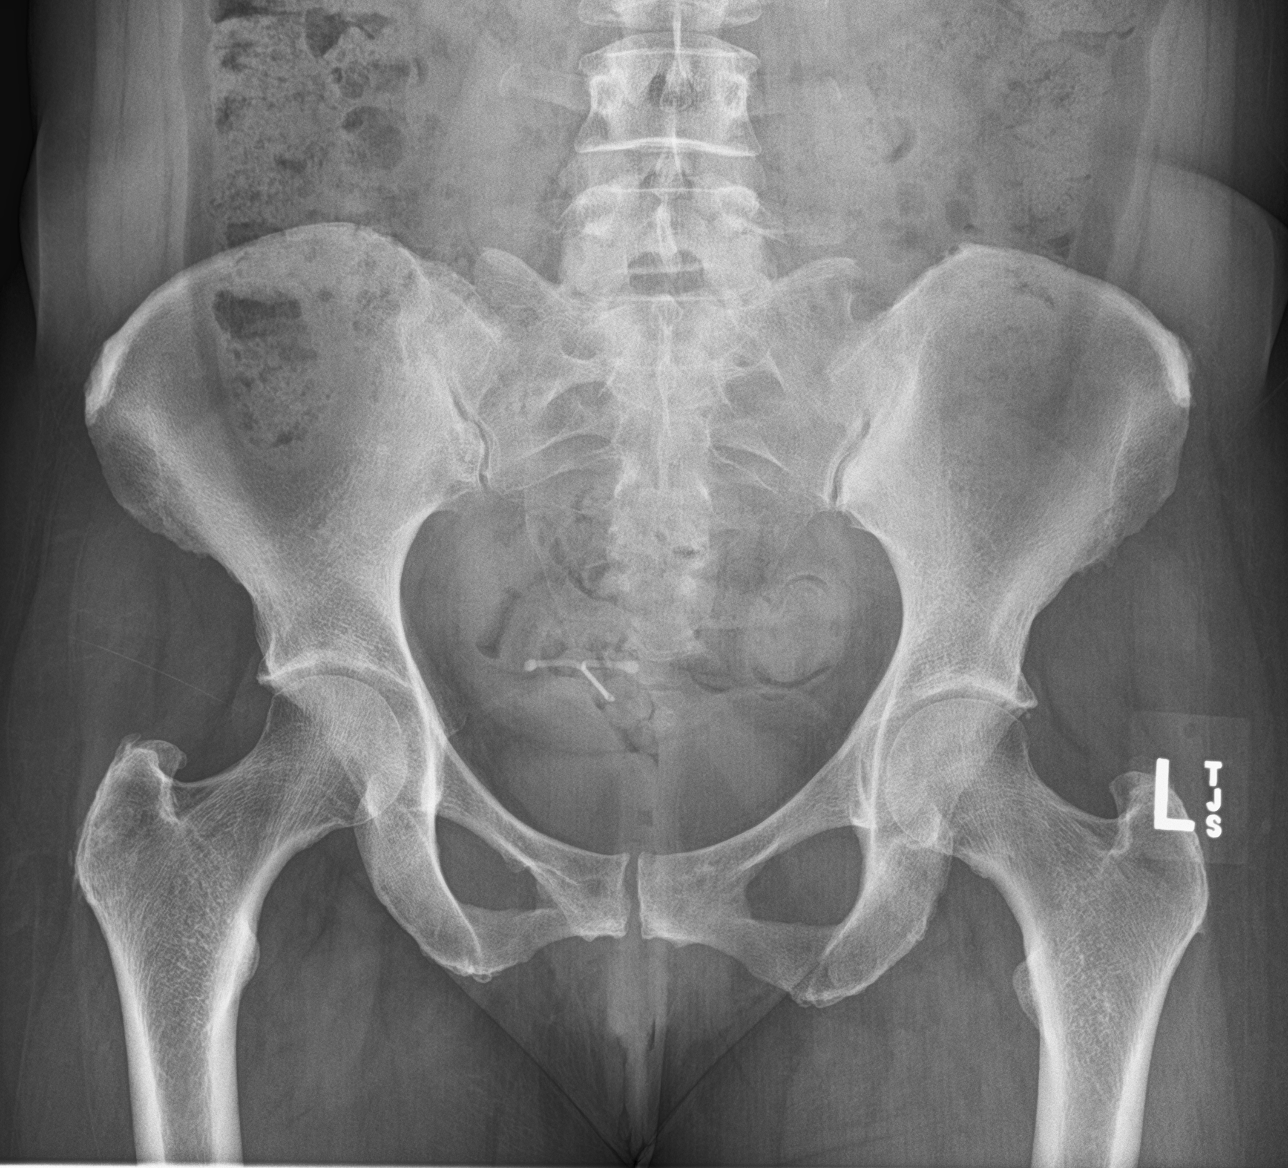
[im 2/3]
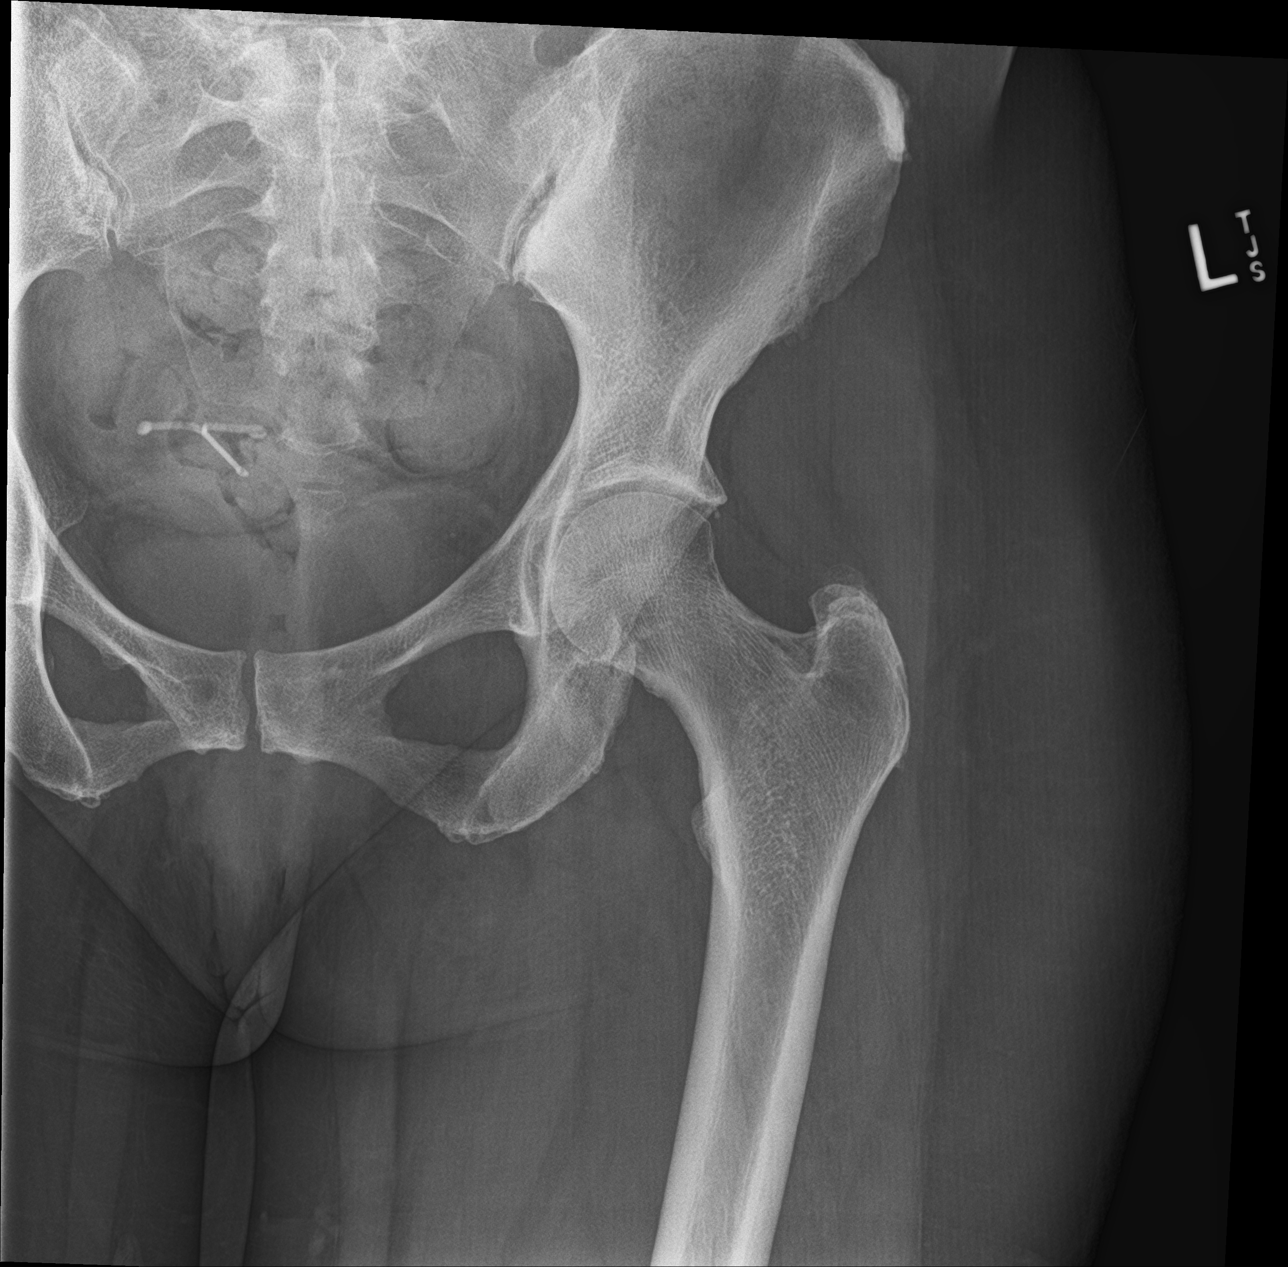
[im 3/3]
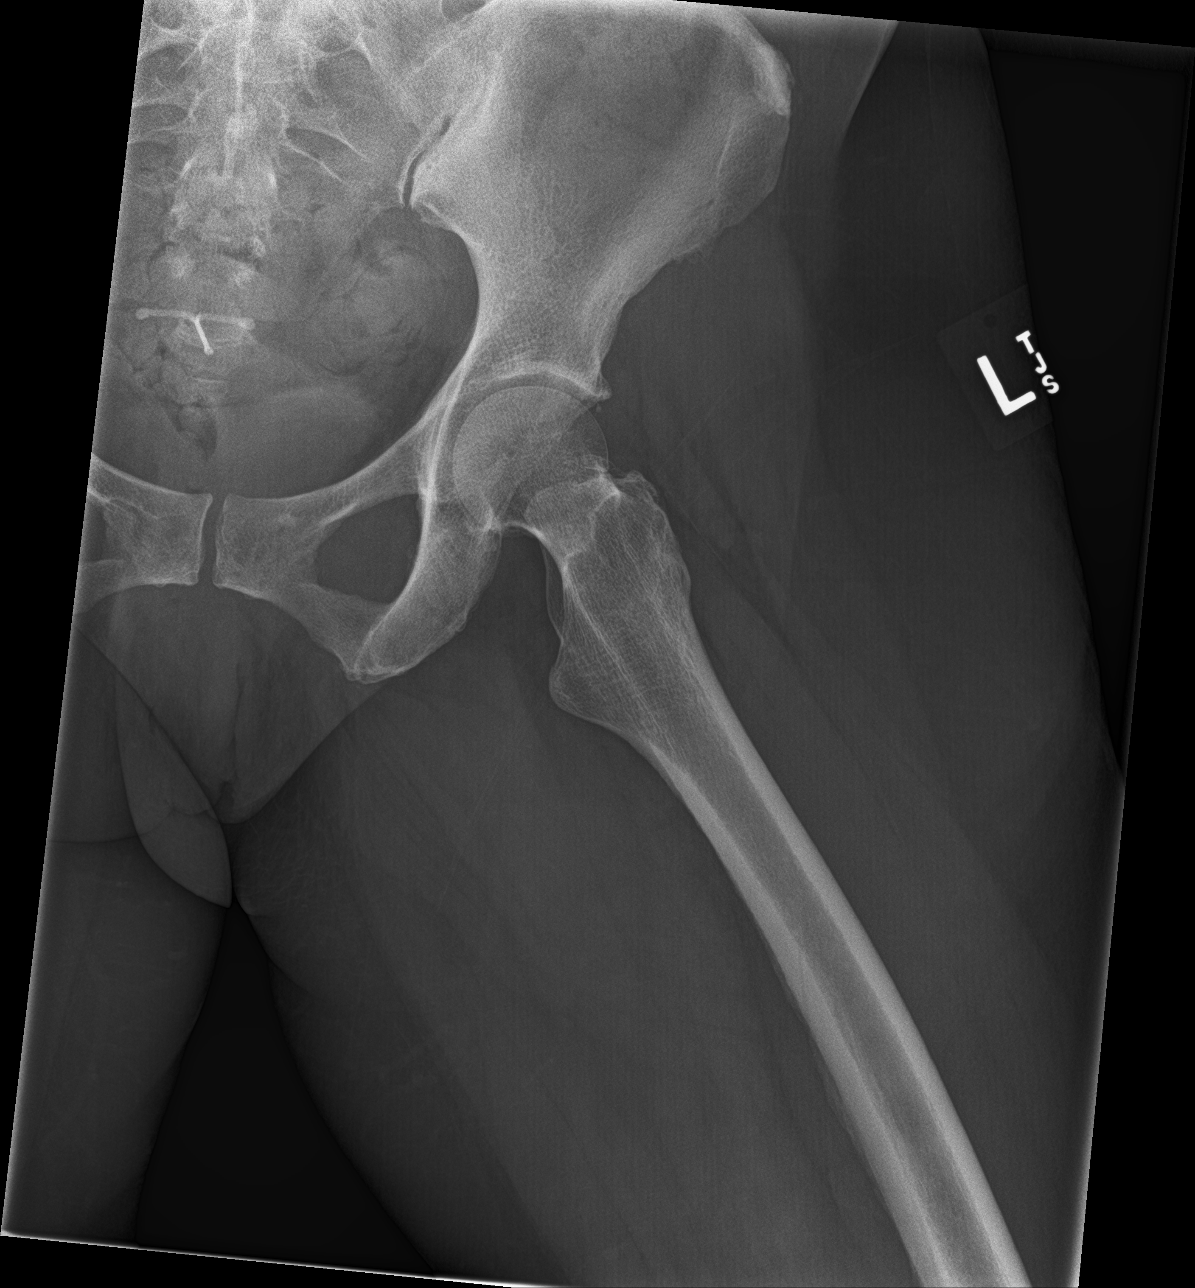

[3 of 3 positions shown; findings below may reference images not displayed]

FINDINGS: There is no evidence of hip fracture or dislocation. There is no
evidence of arthropathy or other focal bone abnormality.
IMPRESSION: Negative.

## 2019-02-20 NOTE — Telephone Encounter (Signed)
Please Review

## 2019-02-20 NOTE — Telephone Encounter (Signed)
Pt is requesting call back for Xray results. Please advise. Thanks TNP

## 2019-02-21 ENCOUNTER — Telehealth: Payer: Self-pay

## 2019-02-21 DIAGNOSIS — M76892 Other specified enthesopathies of left lower limb, excluding foot: Secondary | ICD-10-CM

## 2019-02-21 NOTE — Telephone Encounter (Signed)
Referral changed

## 2019-02-21 NOTE — Telephone Encounter (Signed)
Referral placed and yes she could get again if he agrees.

## 2019-02-21 NOTE — Telephone Encounter (Signed)
Patient calling back that she would like to go see and Orthopedics instead of PT.

## 2019-02-21 NOTE — Addendum Note (Signed)
Addended by: Mar Daring on: 02/21/2019 09:11 AM   Modules accepted: Orders

## 2019-02-21 NOTE — Telephone Encounter (Signed)
Patient advised as directed below. She agreed to the referral for PT. She is asking how frequent can she get the Toradol injection. She has an appointment tomorrow with Dr.Gilbert and wants to know if she can get another one.

## 2019-02-21 NOTE — Telephone Encounter (Signed)
-----   Message from Mar Daring, PA-C sent at 02/20/2019  5:31 PM EDT ----- Hip xray is unremarkable. No avascular necrosis or arthritis thankfully! Most likely strain of hip flexor tendons. May benefit from PT if agreeable. Also continue IBU 600-800 mg TID for pain. Heat will help. Ice after any activity. Light hip flexor stretches.

## 2019-02-21 NOTE — Telephone Encounter (Signed)
Called with results.

## 2019-02-22 ENCOUNTER — Encounter: Payer: Self-pay | Admitting: Family Medicine

## 2019-02-22 ENCOUNTER — Ambulatory Visit (INDEPENDENT_AMBULATORY_CARE_PROVIDER_SITE_OTHER): Payer: BLUE CROSS/BLUE SHIELD | Admitting: Family Medicine

## 2019-02-22 ENCOUNTER — Other Ambulatory Visit: Payer: Self-pay

## 2019-02-22 DIAGNOSIS — E119 Type 2 diabetes mellitus without complications: Secondary | ICD-10-CM | POA: Diagnosis not present

## 2019-02-22 MED ORDER — RYBELSUS 7 MG PO TABS: 1.0000 | ORAL_TABLET | Freq: Every day | ORAL | 12 refills | Status: DC

## 2019-02-22 NOTE — Progress Notes (Signed)
Patient: Judith Fox Female    DOB: 1965/11/02   53 y.o.   MRN: [id number] Visit Date: 02/22/2019  Today's Provider: Wilhemena Durie, MD   Chief Complaint  Patient presents with  . Diabetes  . Hip Pain   Subjective:    Hip Pain  There was no injury mechanism (patient returns back to office today for follow up ). The pain is present in the left hip. The quality of the pain is described as stabbing. The pain is moderate. The pain has been improving since onset. Associated symptoms include an inability to bear weight and muscle weakness. The treatment provided mild relief.    Diabetes Mellitus Type II, Follow-up:   Lab Results  Component Value Date   HGBA1C 7.5 (A) 01/18/2019   HGBA1C 9.2 (A) 09/07/2018   HGBA1C 7.5 (H) 04/28/2018    Last seen for diabetes 1 months ago.  Management since then includes d/c Jardiance due to recurrent infections and starting Rybesus 3mg . She reports good compliance with treatment. She is not having side effects.  Current symptoms include none and have been stable. Home blood sugar records Episodes of hypoglycemia? no   Current insulin regiment: Is not on insulin Most Recent Eye Exam: 05/16/2018 Weight trend: stable Prior visit with dietician: No Current exercise: not asked Current diet habits: well balanced  Pertinent Labs:    Component Value Date/Time   CHOL 94 (L) 04/28/2018 0810   TRIG 75 04/28/2018 0810   HDL 47 04/28/2018 0810   LDLCALC 32 04/28/2018 0810   CREATININE 1.04 (H) 12/19/2018 1216    Wt Readings from Last 3 Encounters:  02/22/19 192 lb 12.8 oz (87.5 kg)  02/19/19 192 lb 6.4 oz (87.3 kg)  02/12/19 193 lb 12.8 oz (87.9 kg)   Allergies  Allergen Reactions  . Latex Rash and Swelling    Eye swelling   . Butorphanol Other (See Comments)    Other reaction(s): Other (See Comments) drowsiness somnolence   . Butorphanol Tartrate     Other reaction(s): Other (See Comments), Other (See Comments)  drowsiness drowsiness somnolence   . Lactose Intolerance (Gi) Other (See Comments)    Reflux and Indigestion     Current Outpatient Medications:  .  acidophilus (RISAQUAD) CAPS capsule, Take 1 capsule by mouth daily., Disp: , Rfl:  .  aspirin EC 81 MG tablet, Take 81 mg by mouth daily., Disp: , Rfl:  .  atorvastatin (LIPITOR) 40 MG tablet, TAKE 1 TABLET BY MOUTH EVERY DAY (Patient taking differently: Take 40 mg by mouth daily. ), Disp: 90 tablet, Rfl: 3 .  benazepril (LOTENSIN) 40 MG tablet, TAKE 1 TABLET BY MOUTH EVERY DAY (Patient taking differently: Take 40 mg by mouth daily. ), Disp: 90 tablet, Rfl: 3 .  doxycycline (PERIOSTAT) 20 MG tablet, TAKE 1 TABLET BY MOUTH TWICE A DAY (Patient taking differently: Take 20 mg by mouth 2 (two) times daily. ), Disp: 180 tablet, Rfl: 3 .  fexofenadine (ALLEGRA) 180 MG tablet, Take 180 mg by mouth daily as needed for allergies. , Disp: , Rfl:  .  fluconazole (DIFLUCAN) 100 MG tablet, Take 1 tablet (100 mg total) by mouth daily., Disp: 14 tablet, Rfl: 0 .  glipiZIDE (GLUCOTROL) 10 MG tablet, TAKE 1 TABLET (10 MG TOTAL) BY MOUTH DAILY BEFORE BREAKFAST., Disp: 90 tablet, Rfl: 3 .  ketorolac (TORADOL) 10 MG tablet, Take 1 tablet (10 mg total) by mouth every 8 (eight) hours as needed for  moderate pain (with food)., Disp: 15 tablet, Rfl: 0 .  ketorolac (TORADOL) 30 MG/ML injection, Inject 30 mg into the vein daily as needed (migraine). , Disp: , Rfl: 5 .  loratadine (CLARITIN) 10 MG tablet, Take 10 mg by mouth daily as needed for allergies. , Disp: , Rfl:  .  metFORMIN (GLUCOPHAGE) 1000 MG tablet, TAKE 1 TABLET (1,000 MG TOTAL) BY MOUTH 2 (TWO) TIMES DAILY WITH A MEAL., Disp: 180 tablet, Rfl: 3 .  metoCLOPramide (REGLAN) 5 MG/ML injection, Inject 10 mg into the vein daily as needed (migraine). , Disp: , Rfl:  .  Multiple Vitamin (MULTIVITAMIN) capsule, Take 1 capsule by mouth daily., Disp: , Rfl:  .  naproxen (NAPROSYN) 500 MG tablet, TAKE 1 TABLET (500 MG  TOTAL) BY MOUTH 2 (TWO) TIMES DAILY WITH A MEAL. (Patient taking differently: Take 500 mg by mouth 2 (two) times daily with a meal. ), Disp: 180 tablet, Rfl: 2 .  nitroGLYCERIN (NITROSTAT) 0.4 MG SL tablet, Place 1 tablet (0.4 mg total) under the tongue every 5 (five) minutes as needed for chest pain., Disp: 50 tablet, Rfl: 3 .  omeprazole (PRILOSEC) 20 MG capsule, TAKE 1 CAPSULE (20 MG TOTAL) BY MOUTH 2 (TWO) TIMES DAILY BEFORE A MEAL., Disp: 180 capsule, Rfl: 3 .  prochlorperazine (COMPAZINE) 10 MG tablet, Take 1 tablet (10 mg total) by mouth every 6 (six) hours as needed for nausea or vomiting (migraine)., Disp: 15 tablet, Rfl: 0 .  promethazine (PHENERGAN) 25 MG tablet, Take 1 tablet (25 mg total) by mouth every 6 (six) hours as needed for nausea or vomiting., Disp: 35 tablet, Rfl: 0 .  Semaglutide (RYBELSUS) 3 MG TABS, Take 1 tablet by mouth daily., Disp: 30 tablet, Rfl: 0 .  triamcinolone cream (KENALOG) 0.1 %, Apply 1 application topically 2 (two) times daily., Disp: 30 g, Rfl: 0 .  diphenhydrAMINE (BENADRYL) 50 MG tablet, Take 0.5 tablets (25 mg total) by mouth every 8 (eight) hours as needed (migraine). (Patient not taking: Reported on 02/22/2019), Disp: 15 tablet, Rfl: 0 .  diphenhydrAMINE (BENADRYL) 50 MG/ML injection, Inject 50 mg into the vein daily as needed (migraine). , Disp: , Rfl:  .  empagliflozin (JARDIANCE) 25 MG TABS tablet, Take 25 mg by mouth daily. (Patient not taking: Reported on 02/22/2019), Disp: 90 tablet, Rfl: 1  Review of Systems  Constitutional: Negative.   HENT: Negative.   Eyes: Negative.   Respiratory: Negative.   Cardiovascular: Negative.   Gastrointestinal: Negative.   Endocrine: Negative.   Genitourinary: Negative.   Musculoskeletal: Negative.   Skin: Negative.   Allergic/Immunologic: Negative.   Neurological: Negative.   Hematological: Negative.   Psychiatric/Behavioral: Negative.     Social History   Tobacco Use  . Smoking status: Never Smoker  .  Smokeless tobacco: Never Used  Substance Use Topics  . Alcohol use: Yes    Alcohol/week: 2.0 standard drinks    Types: 1 Glasses of wine, 1 Shots of liquor per week    Comment: average varies, possibly once monthly      Objective:   BP 124/86   Pulse (!) 106   Temp 98.5 F (36.9 C) (Oral)   Resp 16   Wt 192 lb 12.8 oz (87.5 kg)   SpO2 97%   BMI 28.47 kg/m  Vitals:   02/22/19 1119  BP: 124/86  Pulse: (!) 106  Resp: 16  Temp: 98.5 F (36.9 C)  TempSrc: Oral  SpO2: 97%  Weight: 192 lb 12.8 oz (87.5  kg)     Physical Exam Vitals signs reviewed.  Constitutional:      Appearance: Normal appearance. She is well-developed and normal weight.  HENT:     Head: Normocephalic.     Right Ear: External ear normal.     Left Ear: External ear normal.     Nose: Nose normal.     Mouth/Throat:     Pharynx: No oropharyngeal exudate.  Eyes:     Conjunctiva/sclera: Conjunctivae normal.     Pupils: Pupils are equal, round, and reactive to light.  Neck:     Musculoskeletal: Normal range of motion and neck supple.  Cardiovascular:     Rate and Rhythm: Normal rate and regular rhythm.     Heart sounds: Normal heart sounds.  Pulmonary:     Effort: Pulmonary effort is normal. No respiratory distress.     Breath sounds: Normal breath sounds.  Abdominal:     Palpations: Abdomen is soft.  Musculoskeletal:        General: No tenderness.  Lymphadenopathy:     Cervical: No cervical adenopathy.  Skin:    General: Skin is warm and dry.  Neurological:     General: No focal deficit present.     Mental Status: She is alert and oriented to person, place, and time. Mental status is at baseline.  Psychiatric:        Mood and Affect: Mood normal.        Behavior: Behavior normal.        Thought Content: Thought content normal.        Judgment: Judgment normal.      No results found for any visits on 02/22/19.     Assessment & Plan    1. Type 2 diabetes mellitus without complication,  without long-term current use of insulin (HCC) Improving .RTC 2-3 months for A1C.Increase Rybelsus dose.  - Semaglutide (RYBELSUS) 7 MG TABS; Take 1 tablet by mouth daily.  Dispense: 30 tablet; Refill: 12     Richard Cranford Mon, MD  [facility] Fritzi Mandes Earl as a scribe for Wilhemena Durie, MD.,have documented all relevant documentation on the behalf of Wilhemena Durie, MD,as directed by  Wilhemena Durie, MD while in the presence of Wilhemena Durie, MD.

## 2019-03-05 ENCOUNTER — Encounter: Payer: Self-pay | Admitting: *Deleted

## 2019-03-08 ENCOUNTER — Ambulatory Visit
Admission: RE | Admit: 2019-03-08 | Discharge: 2019-03-08 | Disposition: A | Discharge status: Home / Self Care | Payer: BLUE CROSS/BLUE SHIELD | Source: Ambulatory Visit | Attending: Orthopedic Surgery | Admitting: Orthopedic Surgery

## 2019-03-08 ENCOUNTER — Other Ambulatory Visit: Payer: Self-pay | Admitting: Orthopedic Surgery

## 2019-03-08 ENCOUNTER — Inpatient Hospital Stay: Admit: 2019-03-08 | Payer: BLUE CROSS/BLUE SHIELD

## 2019-03-08 ENCOUNTER — Other Ambulatory Visit: Payer: Self-pay

## 2019-03-08 DIAGNOSIS — M25552 Pain in left hip: Secondary | ICD-10-CM

## 2019-03-08 IMAGING — MR MRI OF THE LEFT HIP WITHOUT CONTRAST
5 series · 32 of 40 positions shown · non-contrast
Comparison: None.

CLINICAL DATA: Left hip pain for 3 weeks.  No known injury.

EXAM:
MR OF THE LEFT HIP WITHOUT CONTRAST
TECHNIQUE: Multiplanar, multisequence MR imaging was performed. No intravenous
contrast was administered.

[Series 2: T1 · coronal · left · 4.0mm · 0.47mm/px · 2 of 38 slices shown]
[im 1/38]
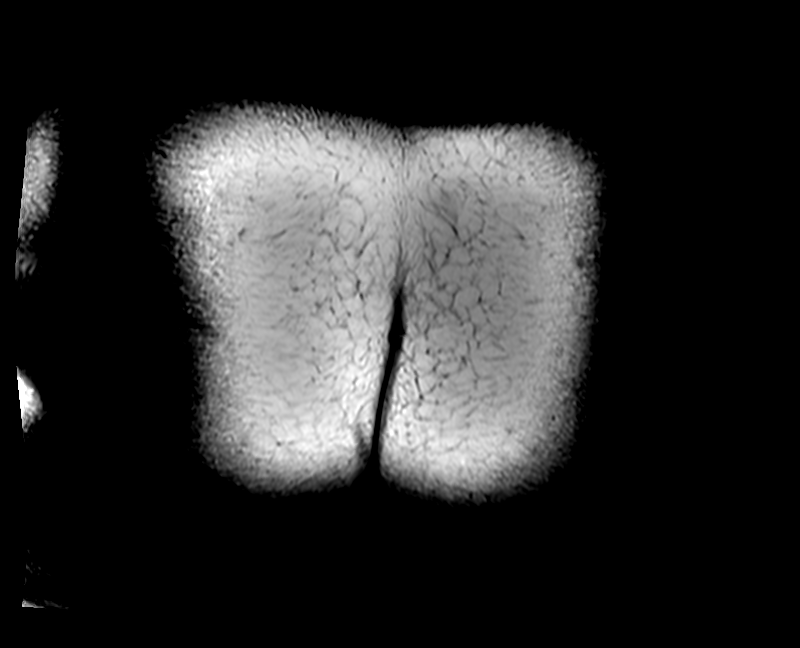
[im 5/38]
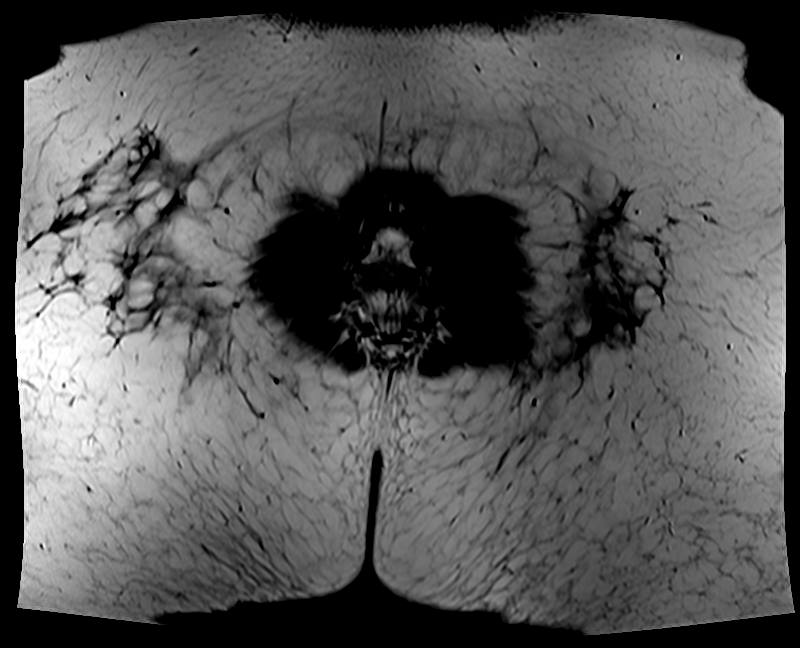

[Series 3: T2 fat-sat · coronal · left · 4.0mm · 1.19mm/px · 9 of 38 slices shown (1 of 2)]
[im 1/38]
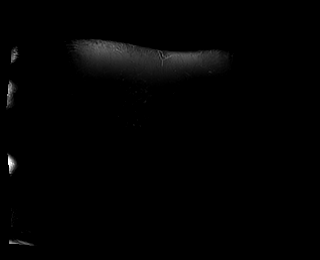
[im 5/38]
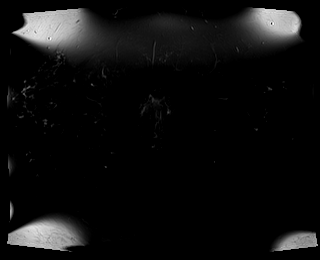
[im 10/38]
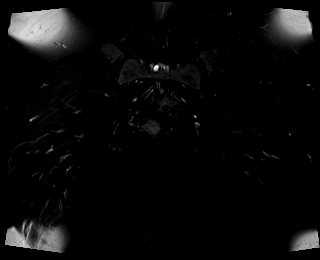
[im 14/38]
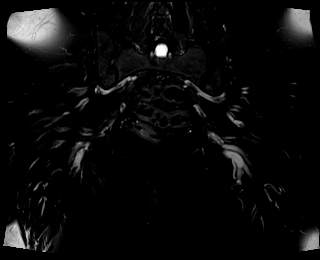
[im 19/38]
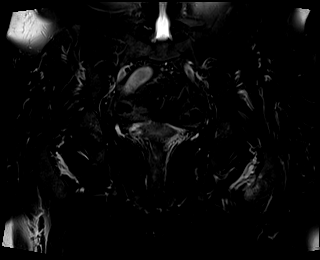
[im 24/38]
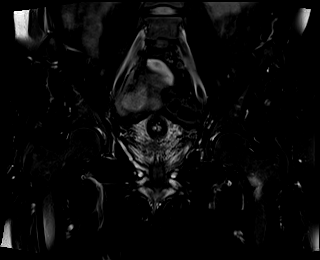
[im 28/38]
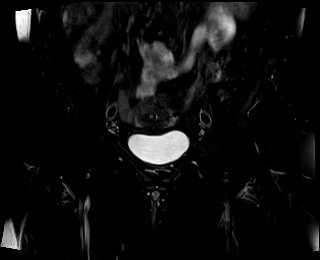
[im 33/38]
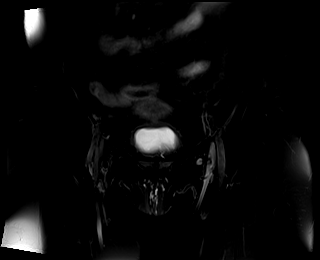
[im 38/38]
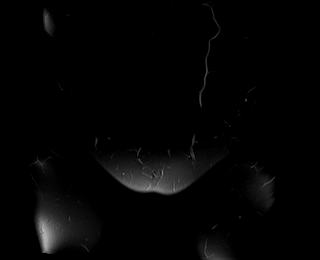

[Series 4: T2 fat-sat · axial · left · 4.0mm · 0.35mm/px · z∈[-135,+10]mm · 7 of 30 slices shown (2 of 2)]
[im 1/30]
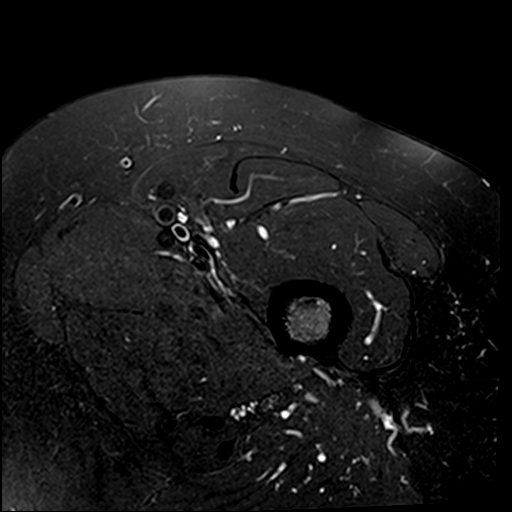
[im 5/30]
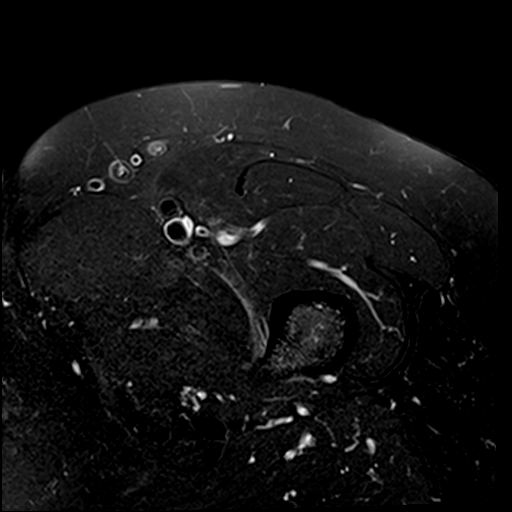
[im 10/30]
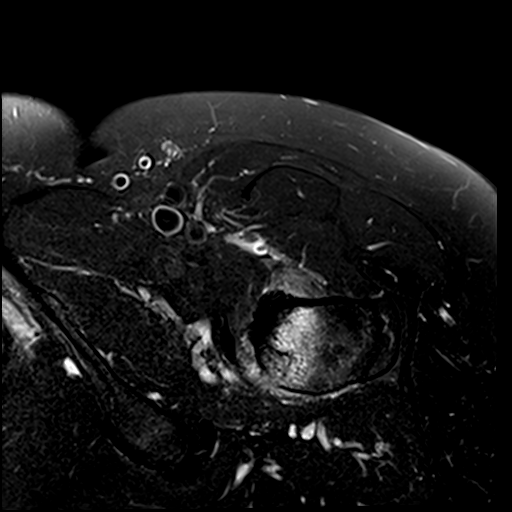
[im 15/30]
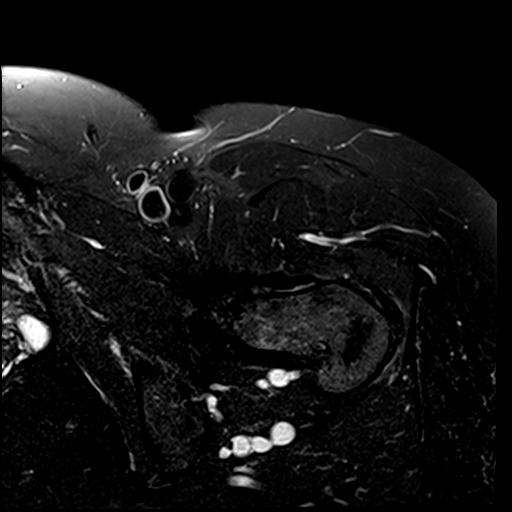
[im 20/30]
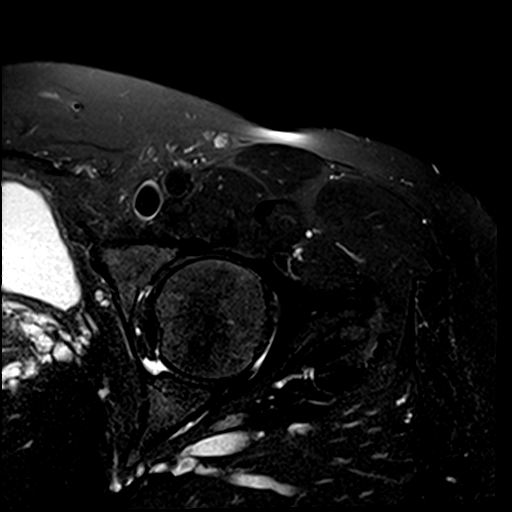
[im 25/30]
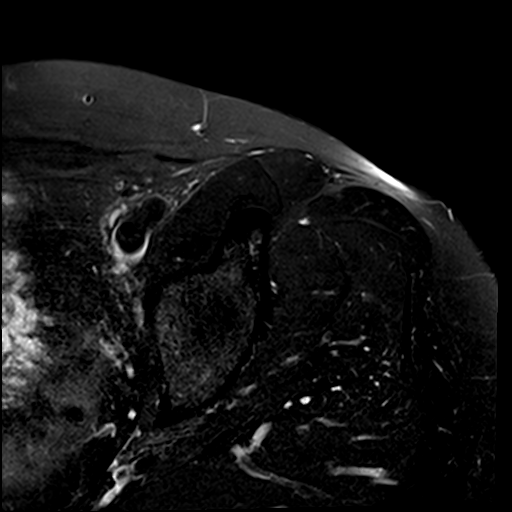
[im 30/30]
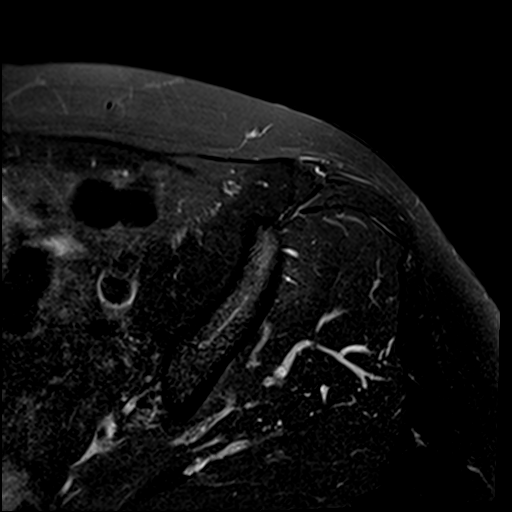

[Series 5: PD fat-sat · sagittal · left · 4.0mm · 0.70mm/px · 7 of 29 slices shown (1 of 2)]
[im 1/29]
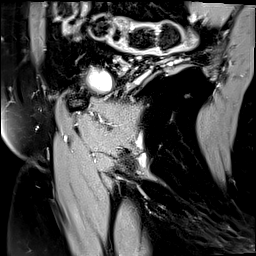
[im 5/29]
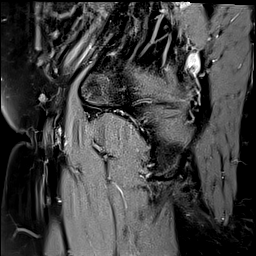
[im 10/29]
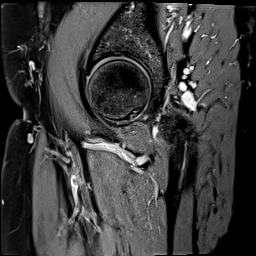
[im 15/29]
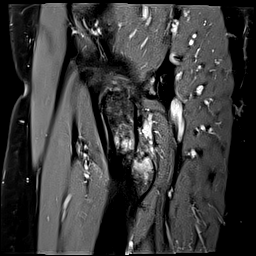
[im 19/29]
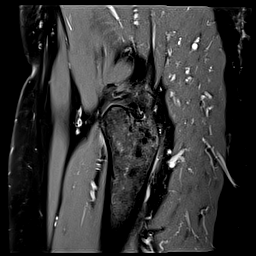
[im 24/29]
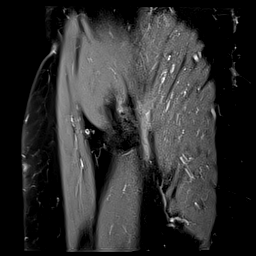
[im 29/29]
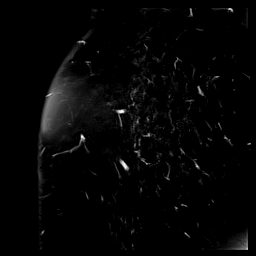

[Series 6: PD fat-sat · coronal · left · 4.0mm · 0.70mm/px · 7 of 29 slices shown (2 of 2)]
[im 1/29]
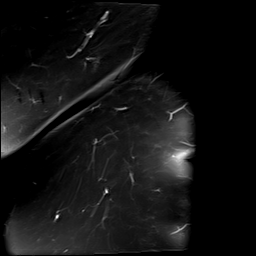
[im 5/29]
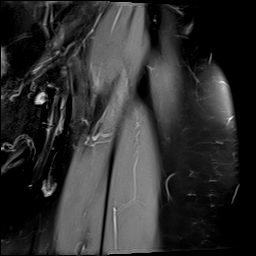
[im 10/29]
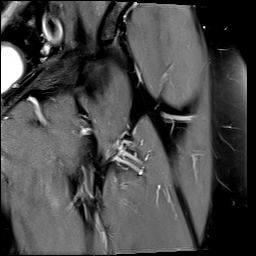
[im 15/29]
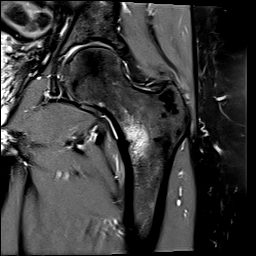
[im 19/29]
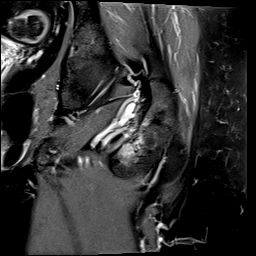
[im 24/29]
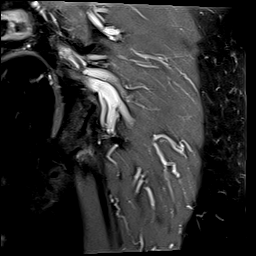
[im 29/29]
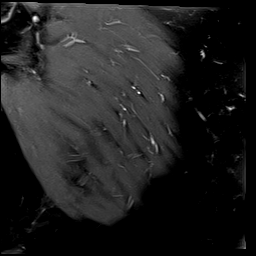

[32 of 40 positions shown; findings below may reference images not displayed]

FINDINGS: Bones:

No right hip fracture, dislocation or avascular necrosis.

No left hip dislocation or avascular necrosis. Focal bone marrow
edema in the inferior medial aspect of the left femoral neck with a
linear component most consistent with a nondisplaced fracture of the
calcar. No periosteal reaction or bone destruction. No aggressive
osseous lesion.

Normal sacrum and sacroiliac joints. No SI joint widening or erosive
changes.

Articular cartilage and labrum

Articular cartilage:  No chondral defect.

Labrum: Grossly intact, but evaluation is limited by lack of
intraarticular fluid.

Joint or bursal effusion

Joint effusion:  No hip joint effusion.  No SI joint effusion.

Bursae:  No bursa formation.

Muscles and tendons

Flexors: Normal.

Extensors: Normal.

Abductors: Normal.

Adductors: Normal.

Gluteals: Normal.

Hamstrings: Normal.

Other findings

Miscellaneous: No pelvic free fluid. No fluid collection or
hematoma. No inguinal lymphadenopathy. No inguinal hernia.
IMPRESSION: 1. Acute nondisplaced fracture of the left inferior medial femoral
neck along the calcar.

## 2019-03-09 ENCOUNTER — Telehealth: Payer: Self-pay | Admitting: Family Medicine

## 2019-03-09 DIAGNOSIS — M8080XA Other osteoporosis with current pathological fracture, unspecified site, initial encounter for fracture: Secondary | ICD-10-CM

## 2019-03-09 NOTE — Telephone Encounter (Signed)
FYI...  Pt's Orthopedic doctor said:  Pt has a non displaced fracture inside surface along top femur  Pt will have an Xray next Thursday  Pt is to only use walker from now till Thurs with left foot down and toes to see if it will heal on it's own.  If it doesn't heal surgery will have to be done.  Orthopedic says Dr Rosanna Randy may want to do an Osteoporosis study because there wasn't an injury that caused this.  Thanks, [employer]

## 2019-03-09 NOTE — Telephone Encounter (Signed)
Please review. Thanks!  

## 2019-03-12 NOTE — Telephone Encounter (Signed)
Bone density test ordered.

## 2019-03-12 NOTE — Telephone Encounter (Signed)
Arrange BMD for osteoporotic fracture

## 2019-03-15 ENCOUNTER — Encounter: Payer: Self-pay | Admitting: *Deleted

## 2019-03-26 ENCOUNTER — Other Ambulatory Visit: Payer: Self-pay | Admitting: Orthopedic Surgery

## 2019-03-26 DIAGNOSIS — S42145A Nondisplaced fracture of glenoid cavity of scapula, left shoulder, initial encounter for closed fracture: Secondary | ICD-10-CM

## 2019-03-26 DIAGNOSIS — S46002A Unspecified injury of muscle(s) and tendon(s) of the rotator cuff of left shoulder, initial encounter: Secondary | ICD-10-CM

## 2019-03-26 DIAGNOSIS — G8929 Other chronic pain: Secondary | ICD-10-CM

## 2019-03-26 DIAGNOSIS — M25512 Pain in left shoulder: Secondary | ICD-10-CM

## 2019-04-03 ENCOUNTER — Ambulatory Visit: Payer: BLUE CROSS/BLUE SHIELD

## 2019-04-13 ENCOUNTER — Other Ambulatory Visit: Payer: Self-pay | Admitting: Family Medicine

## 2019-04-13 DIAGNOSIS — B379 Candidiasis, unspecified: Secondary | ICD-10-CM

## 2019-04-15 ENCOUNTER — Other Ambulatory Visit: Payer: Self-pay | Admitting: Family Medicine

## 2019-04-19 ENCOUNTER — Other Ambulatory Visit: Payer: Self-pay | Admitting: Family Medicine

## 2019-04-19 DIAGNOSIS — E119 Type 2 diabetes mellitus without complications: Secondary | ICD-10-CM

## 2019-05-02 ENCOUNTER — Encounter: Payer: Self-pay | Admitting: Family Medicine

## 2019-05-02 ENCOUNTER — Telehealth: Payer: Self-pay | Admitting: Family Medicine

## 2019-05-02 ENCOUNTER — Other Ambulatory Visit: Payer: Self-pay

## 2019-05-02 ENCOUNTER — Ambulatory Visit (INDEPENDENT_AMBULATORY_CARE_PROVIDER_SITE_OTHER): Payer: BLUE CROSS/BLUE SHIELD | Admitting: Family Medicine

## 2019-05-02 VITALS — BP 128/78 | HR 77 | Temp 97.6°F | Resp 18 | Wt 183.8 lb

## 2019-05-02 DIAGNOSIS — I10 Essential (primary) hypertension: Secondary | ICD-10-CM

## 2019-05-02 DIAGNOSIS — I252 Old myocardial infarction: Secondary | ICD-10-CM

## 2019-05-02 DIAGNOSIS — E119 Type 2 diabetes mellitus without complications: Secondary | ICD-10-CM | POA: Diagnosis not present

## 2019-05-02 DIAGNOSIS — E782 Mixed hyperlipidemia: Secondary | ICD-10-CM

## 2019-05-02 DIAGNOSIS — G43009 Migraine without aura, not intractable, without status migrainosus: Secondary | ICD-10-CM

## 2019-05-02 LAB — POCT GLYCOSYLATED HEMOGLOBIN (HGB A1C): Hemoglobin A1C: 6.5 % — AB (ref 4.0–5.6)

## 2019-05-02 NOTE — Progress Notes (Signed)
Patient: Judith Fox Female    DOB: 1965-09-07   53 y.o.   MRN: [id number] Visit Date: 05/02/2019  Today's Provider: Wilhemena Durie, MD   Chief Complaint  Patient presents with  . Follow-up  . Diabetes   Subjective:     HPI  2 month follow up for diabetes.  Allergies  Allergen Reactions  . Latex Rash and Swelling    Eye swelling   . Butorphanol Other (See Comments)    Other reaction(s): Other (See Comments) drowsiness somnolence   . Butorphanol Tartrate     Other reaction(s): Other (See Comments), Other (See Comments) drowsiness drowsiness somnolence   . Lactose Intolerance (Gi) Other (See Comments)    Reflux and Indigestion     Current Outpatient Medications:  .  acidophilus (RISAQUAD) CAPS capsule, Take 1 capsule by mouth daily., Disp: , Rfl:  .  aspirin EC 81 MG tablet, Take 81 mg by mouth daily., Disp: , Rfl:  .  atorvastatin (LIPITOR) 40 MG tablet, TAKE 1 TABLET BY MOUTH EVERY DAY (Patient taking differently: Take 40 mg by mouth daily. ), Disp: 90 tablet, Rfl: 3 .  benazepril (LOTENSIN) 40 MG tablet, TAKE 1 TABLET BY MOUTH EVERY DAY (Patient taking differently: Take 40 mg by mouth daily. ), Disp: 90 tablet, Rfl: 3 .  diphenhydrAMINE (BENADRYL) 50 MG tablet, Take 0.5 tablets (25 mg total) by mouth every 8 (eight) hours as needed (migraine). (Patient not taking: Reported on 02/22/2019), Disp: 15 tablet, Rfl: 0 .  diphenhydrAMINE (BENADRYL) 50 MG/ML injection, Inject 50 mg into the vein daily as needed (migraine). , Disp: , Rfl:  .  doxycycline (PERIOSTAT) 20 MG tablet, TAKE 1 TABLET BY MOUTH TWICE A DAY (Patient taking differently: Take 20 mg by mouth 2 (two) times daily. ), Disp: 180 tablet, Rfl: 3 .  empagliflozin (JARDIANCE) 25 MG TABS tablet, Take 25 mg by mouth daily. (Patient not taking: Reported on 02/22/2019), Disp: 90 tablet, Rfl: 1 .  fexofenadine (ALLEGRA) 180 MG tablet, Take 180 mg by mouth daily as needed for allergies. , Disp: ,  Rfl:  .  fluconazole (DIFLUCAN) 100 MG tablet, TAKE 1 TABLET BY MOUTH EACH WEEK & AS NEEDED AS DIRECTED BY PRESCRIBER, Disp: 12 tablet, Rfl: 3 .  glipiZIDE (GLUCOTROL) 10 MG tablet, TAKE 1 TABLET (10 MG TOTAL) BY MOUTH DAILY BEFORE BREAKFAST., Disp: 90 tablet, Rfl: 3 .  ketorolac (TORADOL) 10 MG tablet, Take 1 tablet (10 mg total) by mouth every 8 (eight) hours as needed for moderate pain (with food)., Disp: 15 tablet, Rfl: 0 .  ketorolac (TORADOL) 30 MG/ML injection, Inject 30 mg into the vein daily as needed (migraine). , Disp: , Rfl: 5 .  loratadine (CLARITIN) 10 MG tablet, Take 10 mg by mouth daily as needed for allergies. , Disp: , Rfl:  .  metFORMIN (GLUCOPHAGE) 1000 MG tablet, TAKE 1 TABLET (1,000 MG TOTAL) BY MOUTH 2 (TWO) TIMES DAILY WITH A MEAL., Disp: 180 tablet, Rfl: 3 .  metoCLOPramide (REGLAN) 5 MG/ML injection, Inject 10 mg into the vein daily as needed (migraine). , Disp: , Rfl:  .  Multiple Vitamin (MULTIVITAMIN) capsule, Take 1 capsule by mouth daily., Disp: , Rfl:  .  naproxen (NAPROSYN) 500 MG tablet, Take 1 tablet (500 mg total) by mouth 2 (two) times daily as needed., Disp: 180 tablet, Rfl: 1 .  nitroGLYCERIN (NITROSTAT) 0.4 MG SL tablet, Place 1 tablet (0.4 mg total) under the tongue every 5 (  five) minutes as needed for chest pain., Disp: 50 tablet, Rfl: 3 .  omeprazole (PRILOSEC) 20 MG capsule, TAKE 1 CAPSULE (20 MG TOTAL) BY MOUTH 2 (TWO) TIMES DAILY BEFORE A MEAL., Disp: 180 capsule, Rfl: 3 .  prochlorperazine (COMPAZINE) 10 MG tablet, Take 1 tablet (10 mg total) by mouth every 6 (six) hours as needed for nausea or vomiting (migraine)., Disp: 15 tablet, Rfl: 0 .  promethazine (PHENERGAN) 25 MG tablet, Take 1 tablet (25 mg total) by mouth every 6 (six) hours as needed for nausea or vomiting., Disp: 35 tablet, Rfl: 0 .  Semaglutide (RYBELSUS) 7 MG TABS, Take 1 tablet by mouth daily., Disp: 30 tablet, Rfl: 12 .  triamcinolone cream (KENALOG) 0.1 %, Apply 1 application topically  2 (two) times daily., Disp: 30 g, Rfl: 0  Review of Systems  Constitutional: Negative.   HENT: Negative.   Eyes: Negative.   Respiratory: Negative.   Cardiovascular: Negative.   Gastrointestinal: Negative.   Endocrine: Negative.   Genitourinary: Negative.   Musculoskeletal: Negative.   Skin: Negative.   Allergic/Immunologic: Negative.   Neurological: Negative.   Hematological: Negative.   Psychiatric/Behavioral: Negative.     Social History   Tobacco Use  . Smoking status: Never Smoker  . Smokeless tobacco: Never Used  Substance Use Topics  . Alcohol use: Yes    Alcohol/week: 2.0 standard drinks    Types: 1 Glasses of wine, 1 Shots of liquor per week    Comment: average varies, possibly once monthly      Objective:   BP 128/78 (BP Location: Left Arm, Patient Position: Sitting, Cuff Size: Normal)   Pulse 77   Temp 97.6 F (36.4 C) (Temporal)   Resp 18   Wt 183 lb 12.8 oz (83.4 kg)   SpO2 97%   BMI 27.14 kg/m  Vitals:   05/02/19 1331  BP: 128/78  Pulse: 77  Resp: 18  Temp: 97.6 F (36.4 C)  TempSrc: Temporal  SpO2: 97%  Weight: 183 lb 12.8 oz (83.4 kg)  Body mass index is 27.14 kg/m.   Physical Exam Vitals signs reviewed.  Constitutional:      Appearance: Normal appearance. She is well-developed and normal weight.  HENT:     Head: Normocephalic.     Right Ear: External ear normal.     Left Ear: External ear normal.     Nose: Nose normal.     Mouth/Throat:     Pharynx: No oropharyngeal exudate.  Eyes:     Conjunctiva/sclera: Conjunctivae normal.     Pupils: Pupils are equal, round, and reactive to light.  Neck:     Musculoskeletal: Normal range of motion and neck supple.  Cardiovascular:     Rate and Rhythm: Normal rate and regular rhythm.     Heart sounds: Normal heart sounds.  Pulmonary:     Effort: Pulmonary effort is normal. No respiratory distress.     Breath sounds: Normal breath sounds.  Abdominal:     Palpations: Abdomen is soft.   Musculoskeletal:        General: No tenderness.  Lymphadenopathy:     Cervical: No cervical adenopathy.  Skin:    General: Skin is warm and dry.  Neurological:     General: No focal deficit present.     Mental Status: She is alert and oriented to person, place, and time. Mental status is at baseline.  Psychiatric:        Mood and Affect: Mood normal.  Behavior: Behavior normal.        Thought Content: Thought content normal.        Judgment: Judgment normal.      Results for orders placed or performed in visit on 05/02/19  POCT HgB A1C  Result Value Ref Range   Hemoglobin A1C 6.5 (A) 4.0 - 5.6 %   HbA1c POC (<> result, manual entry)     HbA1c, POC (prediabetic range)     HbA1c, POC (controlled diabetic range)         Assessment & Plan    1. Type 2 diabetes mellitus without complication, without long-term current use of insulin (HCC) Good control. - POCT HgB A1C--6.5  2. Migraine without aura and without status migrainosus, not intractable   3. Essential hypertension Controlled  4. History of non-ST elevation myocardial infarction (NSTEMI) 5.HLD On lipitor.    Maleak Brazzel Cranford Mon, MD  [facility]

## 2019-05-02 NOTE — Telephone Encounter (Signed)
Pt needing to discuss her appointments with Stacy.  She states she needed them to be different according to what her and Dr. Rosanna Randy discussed at visit.  Please call pt back.  Thanks, [employer]

## 2019-05-10 ENCOUNTER — Ambulatory Visit: Payer: Self-pay | Admitting: Family Medicine

## 2019-05-28 ENCOUNTER — Other Ambulatory Visit: Payer: Self-pay

## 2019-05-28 ENCOUNTER — Ambulatory Visit (INDEPENDENT_AMBULATORY_CARE_PROVIDER_SITE_OTHER): Payer: BLUE CROSS/BLUE SHIELD | Admitting: Family Medicine

## 2019-05-28 DIAGNOSIS — E119 Type 2 diabetes mellitus without complications: Secondary | ICD-10-CM

## 2019-06-06 ENCOUNTER — Ambulatory Visit: Payer: Self-pay | Admitting: Family Medicine

## 2019-06-11 ENCOUNTER — Telehealth: Payer: Self-pay

## 2019-06-11 DIAGNOSIS — E119 Type 2 diabetes mellitus without complications: Secondary | ICD-10-CM

## 2019-06-11 NOTE — Telephone Encounter (Signed)
Pt called to request more samples of trulicity to get her to follow up appt on 07/02/2019. I noticed that Dr. Rosanna Randy is marked to be out of the office that week and the only appt available was on 06/20/2019. Pt went ahead and rescheduled appt to 06/20/2019 but is concerned that it would be to early to recheck pt's A1C. Please advise. Thanks TNP

## 2019-06-11 NOTE — Telephone Encounter (Signed)
Okay to approve sample? KW

## 2019-06-12 NOTE — Telephone Encounter (Signed)
I am her 17,18,19

## 2019-06-13 NOTE — Telephone Encounter (Signed)
The earliest we can check an A1c on her is after Christmas.  I will just can see how she is doing on the Ozempic.  We can just give her a prescription and see her first of the year if she wants and she is doing well present

## 2019-06-13 NOTE — Telephone Encounter (Signed)
Should we advise her that it should not be too early to recheck her A1C on the 11th? And will she need samples to last until her Appt on the 11th? Please Advise.

## 2019-06-14 MED ORDER — TRULICITY 0.75 MG/0.5ML ~~LOC~~ SOAJ: 0.7500 mg | SUBCUTANEOUS | 0 refills | Status: DC

## 2019-06-14 NOTE — Telephone Encounter (Signed)
Did you want me to start her on Ozempic, and schedule an appt around January?

## 2019-06-14 NOTE — Telephone Encounter (Signed)
thanks

## 2019-06-14 NOTE — Telephone Encounter (Signed)
The prescription for Trulicity 0.75mg /0.54ml has been sent into her pharmacy of choice and she will return to the clinic in January for her Follow up appt.

## 2019-06-20 ENCOUNTER — Ambulatory Visit: Payer: BLUE CROSS/BLUE SHIELD | Admitting: Family Medicine

## 2019-06-28 ENCOUNTER — Other Ambulatory Visit: Payer: Self-pay

## 2019-06-28 DIAGNOSIS — Z20822 Contact with and (suspected) exposure to covid-19: Secondary | ICD-10-CM

## 2019-06-30 LAB — NOVEL CORONAVIRUS, NAA: SARS-CoV-2, NAA: NOT DETECTED

## 2019-07-02 ENCOUNTER — Ambulatory Visit: Payer: BLUE CROSS/BLUE SHIELD | Admitting: Family Medicine

## 2019-07-03 ENCOUNTER — Telehealth: Payer: Self-pay | Admitting: Hematology

## 2019-07-03 NOTE — Telephone Encounter (Signed)
Pt is aware covid 19 test is neg on 07-03-2019

## 2019-07-04 LAB — HM DIABETES EYE EXAM

## 2019-07-12 ENCOUNTER — Other Ambulatory Visit: Payer: Self-pay | Admitting: Family Medicine

## 2019-07-12 DIAGNOSIS — L719 Rosacea, unspecified: Secondary | ICD-10-CM

## 2019-07-12 DIAGNOSIS — E119 Type 2 diabetes mellitus without complications: Secondary | ICD-10-CM

## 2019-07-12 DIAGNOSIS — K219 Gastro-esophageal reflux disease without esophagitis: Secondary | ICD-10-CM

## 2019-07-17 ENCOUNTER — Other Ambulatory Visit: Payer: Self-pay | Admitting: Obstetrics and Gynecology

## 2019-07-17 DIAGNOSIS — Z1231 Encounter for screening mammogram for malignant neoplasm of breast: Secondary | ICD-10-CM

## 2019-07-26 ENCOUNTER — Other Ambulatory Visit: Payer: Self-pay | Admitting: Family Medicine

## 2019-07-31 ENCOUNTER — Ambulatory Visit
Admission: RE | Admit: 2019-07-31 | Discharge: 2019-07-31 | Disposition: A | Discharge status: Home / Self Care | Payer: BLUE CROSS/BLUE SHIELD | Source: Ambulatory Visit | Attending: Obstetrics and Gynecology | Admitting: Obstetrics and Gynecology

## 2019-07-31 DIAGNOSIS — Z1231 Encounter for screening mammogram for malignant neoplasm of breast: Secondary | ICD-10-CM | POA: Insufficient documentation

## 2019-07-31 IMAGING — MG DIGITAL SCREENING BILAT W/ TOMO W/ CAD
6 of 10 series · 6 of 30 positions shown · non-contrast
Comparison: Previous exam(s).

CLINICAL DATA: Screening.

EXAM:
DIGITAL SCREENING BILATERAL MAMMOGRAM WITH TOMO AND CAD

[R CC synth-2D (1 of 2)]
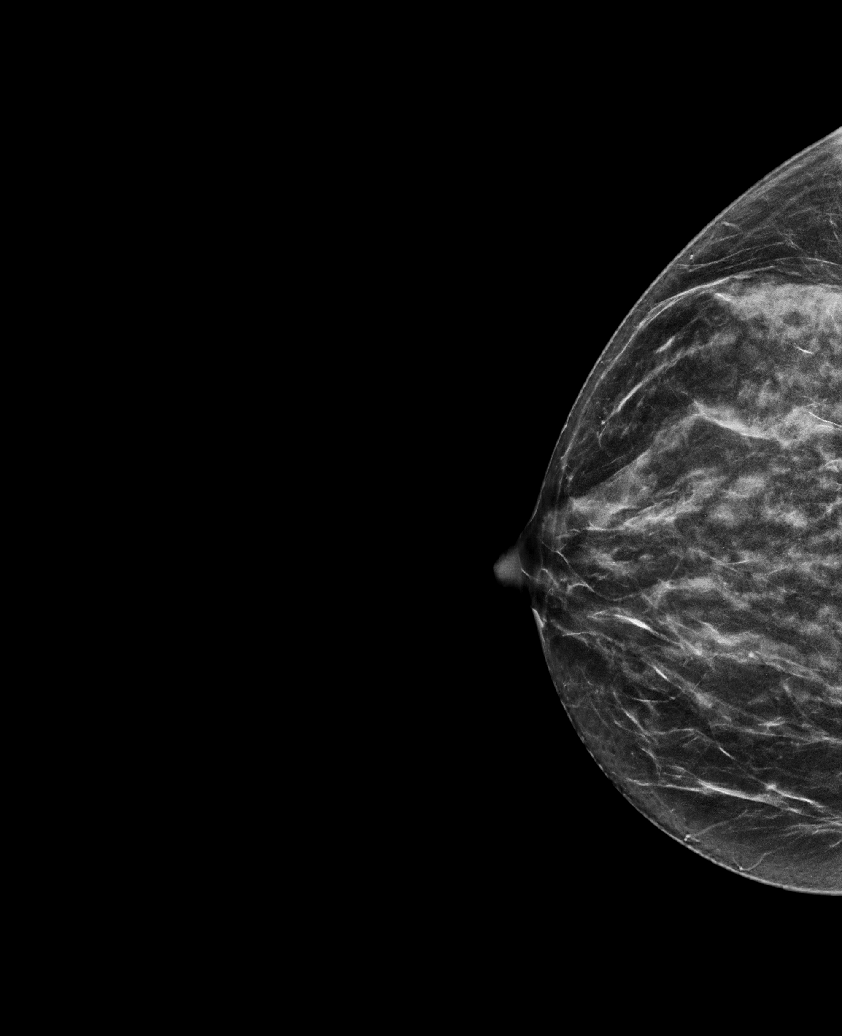

[R MLO synth-2D]
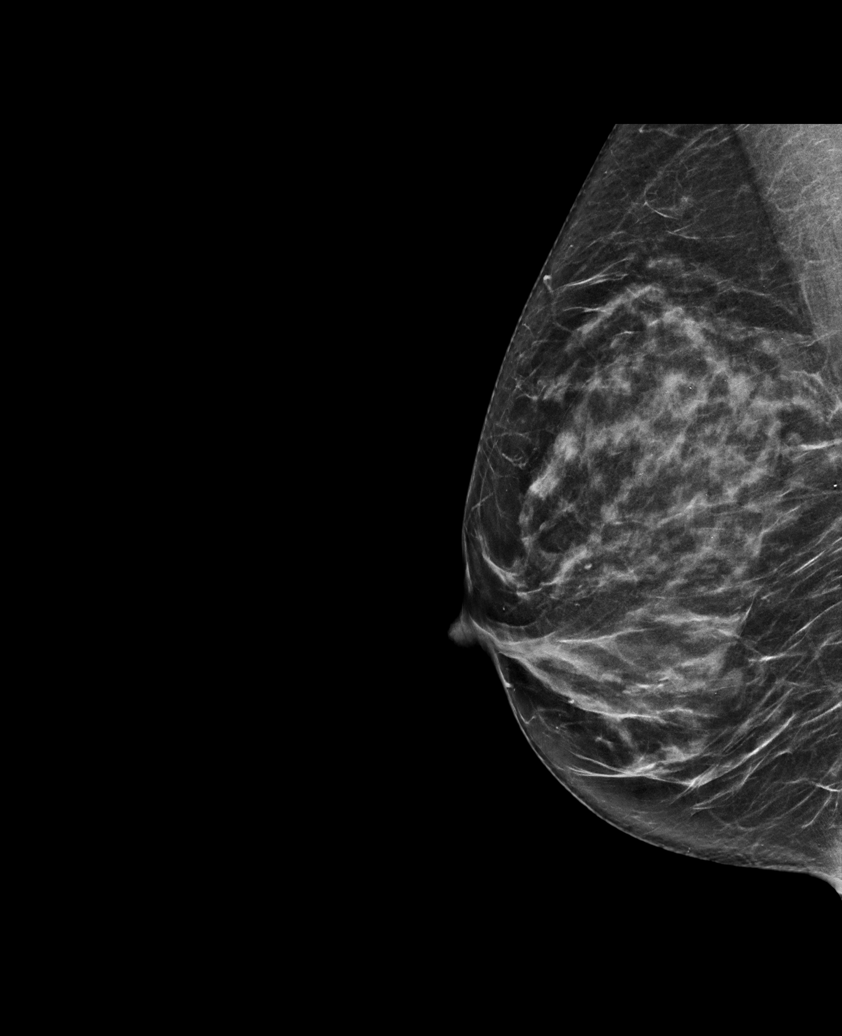

[L MLO synth-2D]
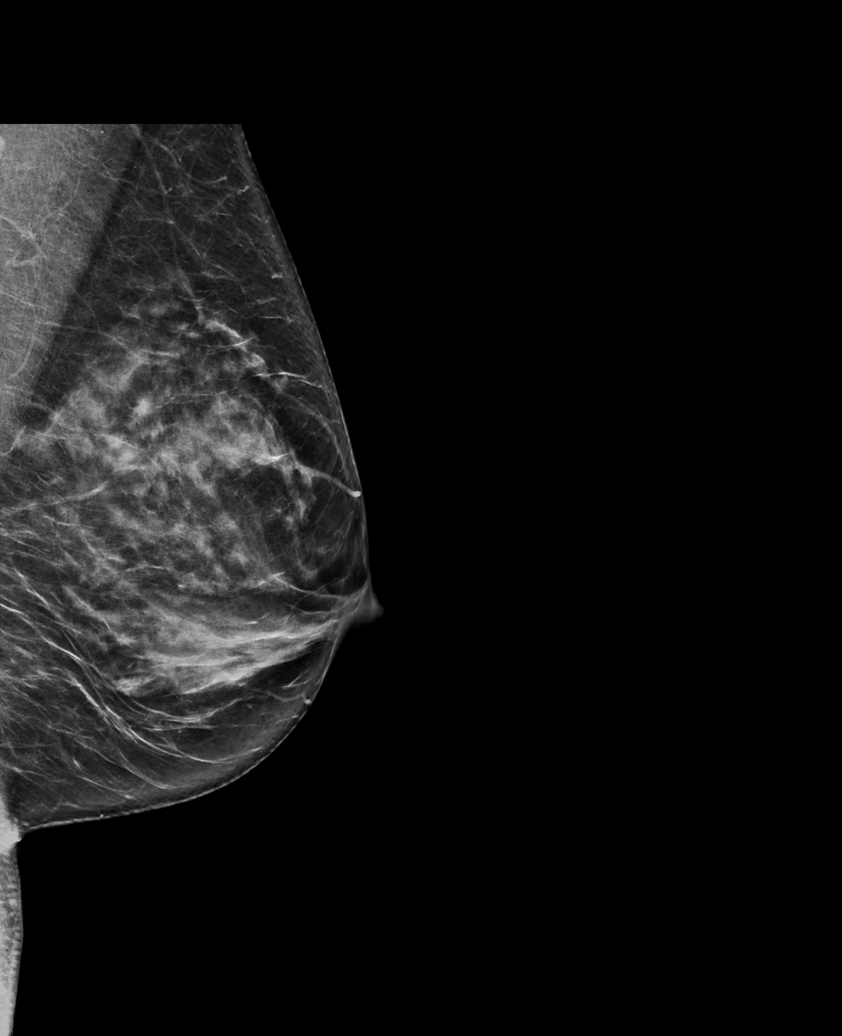

[L CC synth-2D]
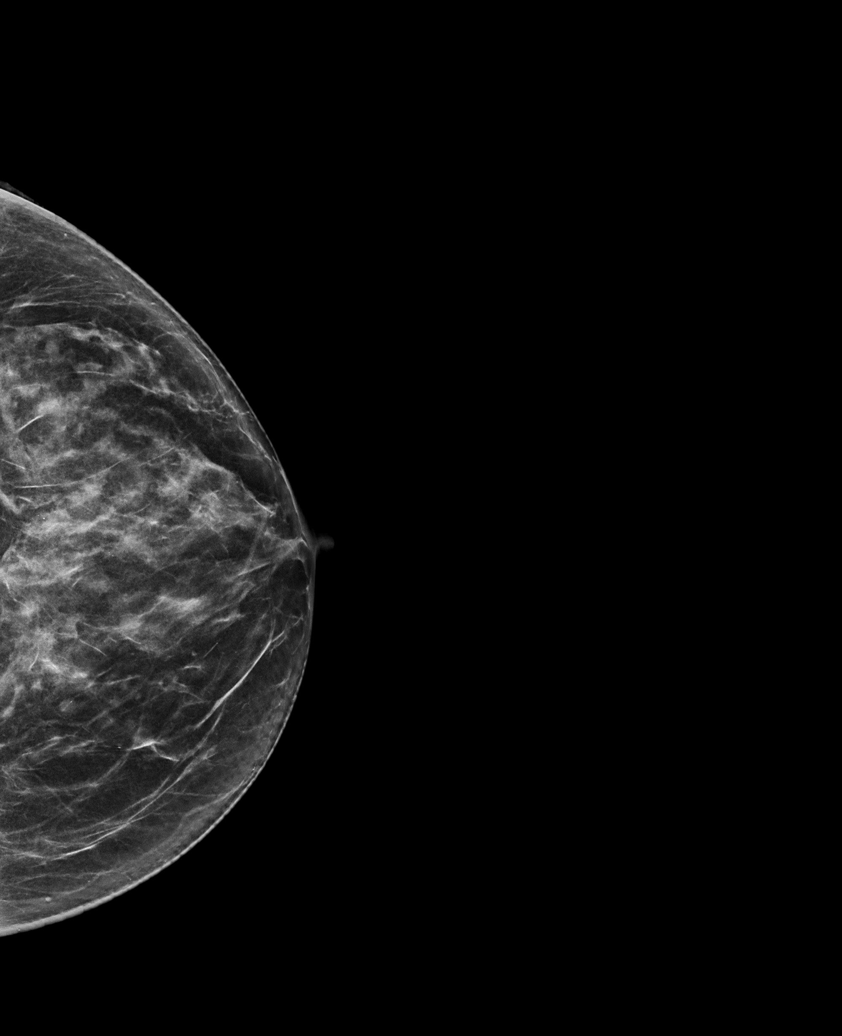

[R CC synth-2D (2 of 2)]
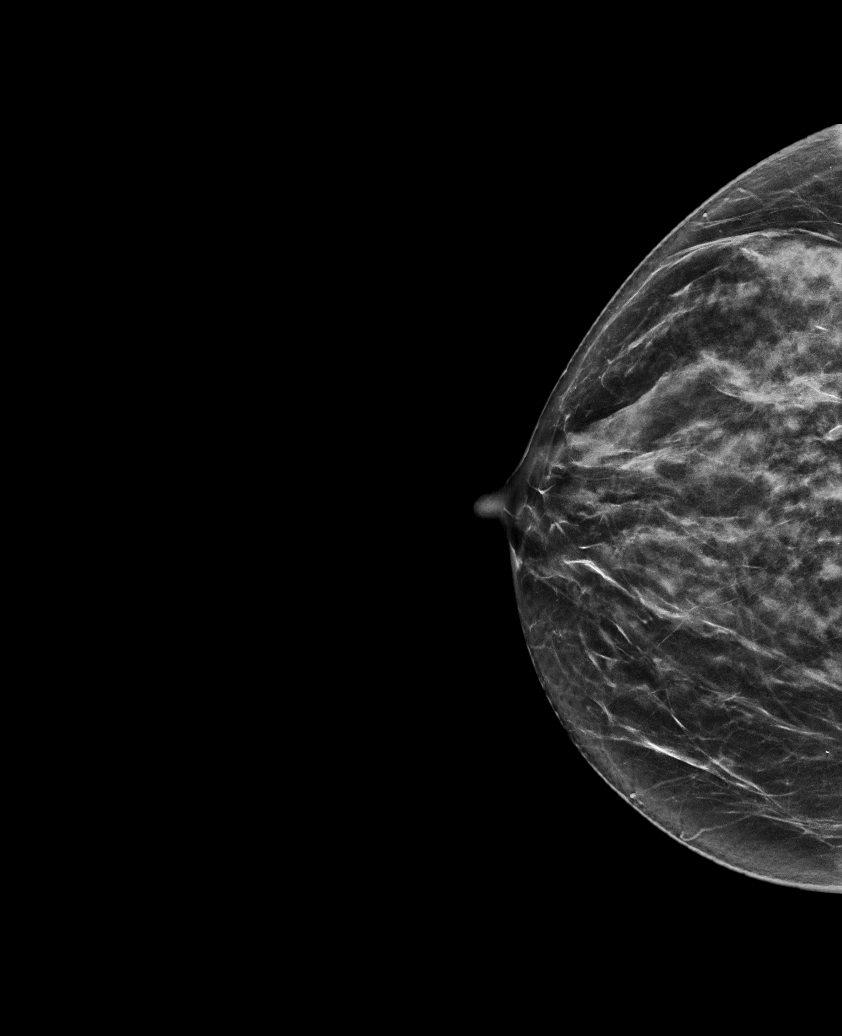

[L MLO tomo · tomo slice 37/74.0]
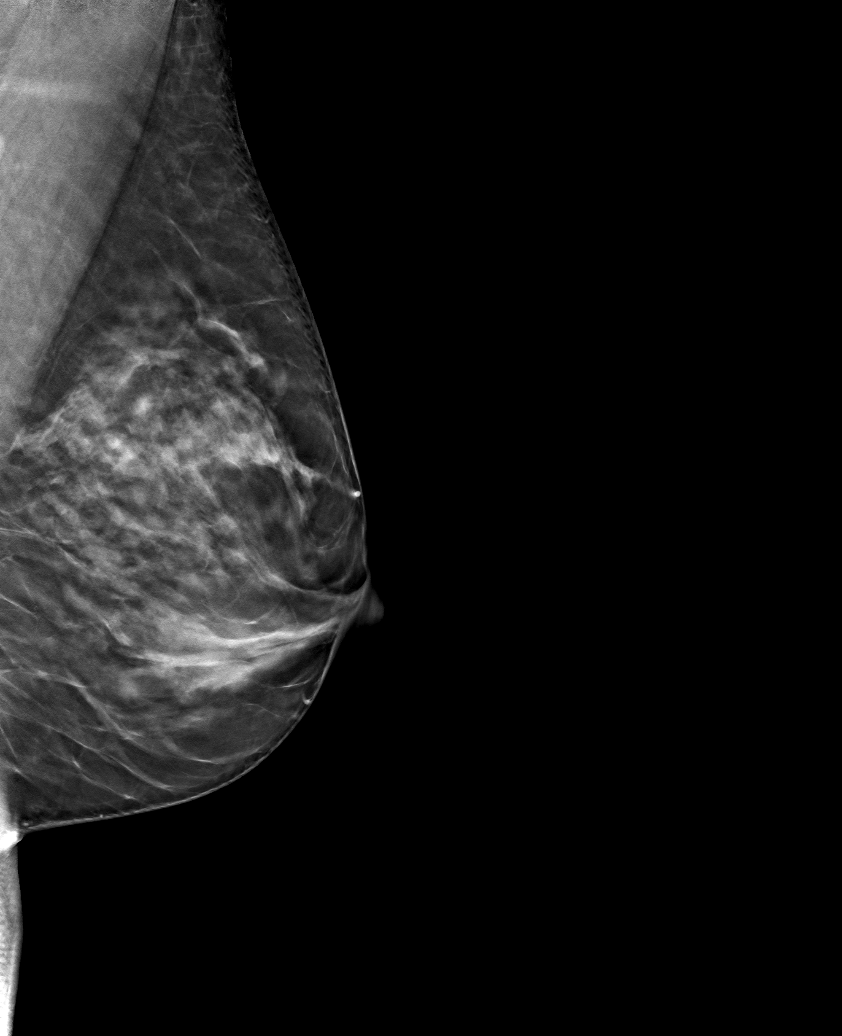

[6 of 30 positions shown; findings below may reference images not displayed]

ACR Breast Density Category c: The breast tissue is heterogeneously
dense, which may obscure small masses.
FINDINGS: There are no findings suspicious for malignancy. Images were
processed with CAD.
IMPRESSION: No mammographic evidence of malignancy. A result letter of this
screening mammogram will be mailed directly to the patient.

RECOMMENDATION:
Screening mammogram in one year. (Code:[5V])

BI-RADS CATEGORY  1: Negative.

## 2019-08-15 NOTE — Progress Notes (Signed)
Patient: Judith Fox Female    DOB: Jan 05, 1966   54 y.o.   MRN: [id number] Visit Date: 08/16/2019  Today's Provider: Wilhemena Durie, MD   Chief Complaint  Patient presents with  . Follow-up  . Diabetes   Subjective:     Diabetes She presents for her follow-up diabetic visit. Pertinent negatives for hypoglycemia include no dizziness. Pertinent negatives for diabetes include no chest pain, no fatigue and no weakness. There are no hypoglycemic complications. Symptoms are stable. There are no diabetic complications. She is compliant with treatment all of the time. She rarely participates in exercise.     Diabetes Mellitus Type II, Follow-up:   Lab Results  Component Value Date   HGBA1C 6.3 (A) 08/16/2019   HGBA1C 6.5 (A) 05/02/2019   HGBA1C 7.5 (A) 01/18/2019   Last seen for diabetes 3 months ago.  Management since then includes Trulicity, metformin. She reports excellent compliance with treatment. She is not having side effects.  Current symptoms include none and have been stable. Home blood sugar records: N/A  Episodes of hypoglycemia? no   Current Insulin Regimen:  Most Recent Eye Exam:  Weight trend: stable Prior visit with dietician: yes -  Current diet: in general, an "unhealthy" diet Current exercise: none  -----------------------------------------------------------   Hypertension, follow-up:  BP Readings from Last 3 Encounters:  08/16/19 133/82  05/02/19 128/78  02/22/19 124/86    She was last seen for hypertension 3 months ago.  BP at that visit was 128/78. Management since that visit includes .She reports good compliance with treatment. She is not having side effects.  She is not exercising. She is not adherent to low salt diet.  . She is experiencing none.  Patient denies none.   ----------------------------------------------------------    Lipid/Cholesterol, Follow-up:   Last seen for this 3 months ago.  Management  since that visit includes .  Last Lipid Panel:    Component Value Date/Time   CHOL 94 (L) 04/28/2018 0810   TRIG 75 04/28/2018 0810   HDL 47 04/28/2018 0810   LDLCALC 32 04/28/2018 0810    She reports excellent compliance with treatment. She is not having side effects. None    -----------------------------------------------------------  Type 2 diabetes mellitus without complication, without long-term current use of insulin ([place]) From 05/02/2019-Good control. HgB A1C--6.5.  Essential hypertension From 05/02/2019-Controlled.  Hyperlipidemia  From 04/28/2018-labs stable.    Allergies  Allergen Reactions  . Latex Rash and Swelling    Eye swelling   . Butorphanol Other (See Comments)    Other reaction(s): Other (See Comments) drowsiness somnolence   . Butorphanol Tartrate     Other reaction(s): Other (See Comments), Other (See Comments) drowsiness drowsiness somnolence   . Lactose Intolerance (Gi) Other (See Comments)    Reflux and Indigestion     Current Outpatient Medications:  .  atorvastatin (LIPITOR) 40 MG tablet, TAKE 1 TABLET BY MOUTH EVERY DAY, Disp: 90 tablet, Rfl: 3 .  benazepril (LOTENSIN) 40 MG tablet, TAKE 1 TABLET BY MOUTH EVERY DAY, Disp: 90 tablet, Rfl: 3 .  doxycycline (PERIOSTAT) 20 MG tablet, Take 1 tablet (20 mg total) by mouth 2 (two) times daily., Disp: 180 tablet, Rfl: 1 .  Dulaglutide (TRULICITY) A999333 0000000 SOPN, Inject 0.75 mg into the skin once a week., Disp: 12 pen, Rfl: 0 .  fexofenadine (ALLEGRA) 180 MG tablet, Take 180 mg by mouth daily as needed for allergies. , Disp: , Rfl:  .  fluconazole (DIFLUCAN)  150 MG tablet, Take 150 mg by mouth daily. PRN, Disp: , Rfl:  .  ketorolac (TORADOL) 10 MG tablet, Take 1 tablet (10 mg total) by mouth every 8 (eight) hours as needed for moderate pain (with food)., Disp: 15 tablet, Rfl: 0 .  loratadine (CLARITIN) 10 MG tablet, Take 10 mg by mouth daily as needed for allergies. , Disp: , Rfl:  .   metFORMIN (GLUCOPHAGE) 1000 MG tablet, TAKE 1 TABLET BY MOUTH 2 TIMES DAILY WITH A MEAL., Disp: 180 tablet, Rfl: 3 .  metoCLOPramide (REGLAN) 5 MG/ML injection, Inject 10 mg into the vein daily as needed (migraine). , Disp: , Rfl:  .  Multiple Vitamin (MULTIVITAMIN) capsule, Take 1 capsule by mouth daily., Disp: , Rfl:  .  naproxen (NAPROSYN) 500 MG tablet, Take 1 tablet (500 mg total) by mouth 2 (two) times daily as needed., Disp: 180 tablet, Rfl: 1 .  nitroGLYCERIN (NITROSTAT) 0.4 MG SL tablet, Place 1 tablet (0.4 mg total) under the tongue every 5 (five) minutes as needed for chest pain., Disp: 50 tablet, Rfl: 3 .  omeprazole (PRILOSEC) 20 MG capsule, TAKE 1 CAPSULE BY MOUTH 2 TIMES DAILY BEFORE A MEAL., Disp: 180 capsule, Rfl: 3 .  prochlorperazine (COMPAZINE) 10 MG tablet, Take 1 tablet (10 mg total) by mouth every 6 (six) hours as needed for nausea or vomiting (migraine)., Disp: 15 tablet, Rfl: 0 .  promethazine (PHENERGAN) 25 MG tablet, Take 1 tablet (25 mg total) by mouth every 6 (six) hours as needed for nausea or vomiting., Disp: 35 tablet, Rfl: 0 .  RESTASIS 0.05 % ophthalmic emulsion, , Disp: , Rfl:  .  aspirin EC 81 MG tablet, Take 81 mg by mouth daily., Disp: , Rfl:  .  glipiZIDE (GLUCOTROL) 10 MG tablet, TAKE 1 TABLET (10 MG TOTAL) BY MOUTH DAILY BEFORE BREAKFAST., Disp: 90 tablet, Rfl: 3  Review of Systems  Constitutional: Negative for appetite change, chills, fatigue and fever.  HENT: Negative.   Eyes: Negative.   Respiratory: Negative for chest tightness and shortness of breath.   Cardiovascular: Negative for chest pain and palpitations.  Gastrointestinal: Negative for abdominal pain, nausea and vomiting.  Endocrine: Negative.   Genitourinary: Negative.   Musculoskeletal: Negative.   Skin: Negative.   Allergic/Immunologic: Negative.   Neurological: Negative for dizziness and weakness.  Hematological: Negative.   Psychiatric/Behavioral: Negative.     Social History    Tobacco Use  . Smoking status: Never Smoker  . Smokeless tobacco: Never Used  Substance Use Topics  . Alcohol use: Yes    Alcohol/week: 2.0 standard drinks    Types: 1 Glasses of wine, 1 Shots of liquor per week    Comment: average varies, possibly once monthly      Objective:   BP 133/82   Pulse (!) 105   Temp (!) 97.1 F (36.2 C) (Temporal)   Resp 16   Ht 5\' 9"  (1.753 m)   Wt 188 lb 6.4 oz (85.5 kg)   SpO2 98%   BMI 27.82 kg/m  Vitals:   08/16/19 0857  BP: 133/82  Pulse: (!) 105  Resp: 16  Temp: (!) 97.1 F (36.2 C)  TempSrc: Temporal  SpO2: 98%  Weight: 188 lb 6.4 oz (85.5 kg)  Height: 5\' 9"  (1.753 m)  Body mass index is 27.82 kg/m.   Physical Exam Vitals reviewed.  Constitutional:      Appearance: Normal appearance. She is well-developed and normal weight.  HENT:     Head:  Normocephalic and atraumatic.     Right Ear: External ear normal.     Left Ear: External ear normal.     Nose: Nose normal.     Mouth/Throat:     Pharynx: No oropharyngeal exudate.  Eyes:     Conjunctiva/sclera: Conjunctivae normal.     Pupils: Pupils are equal, round, and reactive to light.  Cardiovascular:     Rate and Rhythm: Normal rate and regular rhythm.     Heart sounds: Normal heart sounds.  Pulmonary:     Effort: Pulmonary effort is normal. No respiratory distress.     Breath sounds: Normal breath sounds.  Abdominal:     Palpations: Abdomen is soft.  Musculoskeletal:        General: No tenderness.     Cervical back: Normal range of motion and neck supple.  Lymphadenopathy:     Cervical: No cervical adenopathy.  Skin:    General: Skin is warm and dry.  Neurological:     General: No focal deficit present.     Mental Status: She is alert and oriented to person, place, and time. Mental status is at baseline.  Psychiatric:        Mood and Affect: Mood normal.        Behavior: Behavior normal.        Thought Content: Thought content normal.        Judgment:  Judgment normal.      Results for orders placed or performed in visit on 08/16/19  POCT HgB A1C  Result Value Ref Range   Hemoglobin A1C 6.3 (A) 4.0 - 5.6 %   Est. average glucose Bld gHb Est-mCnc 134        Assessment & Plan    1. Type 2 diabetes mellitus without complication, without long-term current use of insulin ([facility]) Good control. - POCT HgB A1C 6.3.   Follow up in 4 months.     Richard Cranford Mon, MD  [facility]

## 2019-08-16 ENCOUNTER — Encounter: Payer: Self-pay | Admitting: Family Medicine

## 2019-08-16 ENCOUNTER — Ambulatory Visit (INDEPENDENT_AMBULATORY_CARE_PROVIDER_SITE_OTHER): Payer: BLUE CROSS/BLUE SHIELD | Admitting: Family Medicine

## 2019-08-16 VITALS — BP 133/82 | HR 105 | Temp 97.1°F | Resp 16 | Ht 69.0 in | Wt 188.4 lb

## 2019-08-16 DIAGNOSIS — E119 Type 2 diabetes mellitus without complications: Secondary | ICD-10-CM

## 2019-08-16 LAB — POCT GLYCOSYLATED HEMOGLOBIN (HGB A1C)
Est. average glucose Bld gHb Est-mCnc: 134
Hemoglobin A1C: 6.3 % — AB (ref 4.0–5.6)

## 2019-08-22 ENCOUNTER — Other Ambulatory Visit: Payer: Self-pay | Admitting: Family Medicine

## 2019-08-22 DIAGNOSIS — E119 Type 2 diabetes mellitus without complications: Secondary | ICD-10-CM

## 2019-09-25 NOTE — Progress Notes (Signed)
Patient: Judith Fox Female    DOB: [birth year]   54 y.o.   MRN: [id number] Visit Date: 09/25/2019  Today's Provider: Wilhemena Durie, MD   Chief Complaint  Patient presents with  . URI   Subjective:    Virtual Visit via Telephone Note  I connected with Judith Fox on 09/25/19 at  8:00 AM EST by telephone and verified that I am speaking with the correct person using two identifiers.  Location: Patient: Home Provider: Office   I discussed the limitations, risks, security and privacy concerns of performing an evaluation and management service by telephone and the availability of in person appointments. I also discussed with the patient that there may be a patient responsible charge related to this service. The patient expressed understanding and agreed to proceed.  HPI Upper Respiratory Infection: Patient complains of symptoms of a URI, possible sinusitis. Symptoms include cough. Onset of symptoms was 2 weeks ago, controlled since that time. She also c/o congestion for the past 2 week .  She is drinking moderate amounts of fluids. Evaluation to date: none seen previously and thought to have a viral URI. Treatment to date: none. Patient was seen 2 weeks ago at the local urgent care and had a negative rapid Covid test.  She was told she had a viral URI.  She has been taking her antihistamine.  She now has sinus congestion and lymph consistent with previous sinus otitis she has.  She thinks the postnasal drainage is causing her cough.  She is for  fatigued but with no high fevers.   Allergies  Allergen Reactions  . Latex Rash and Swelling    Eye swelling   . Butorphanol Other (See Comments)    Other reaction(s): Other (See Comments) drowsiness somnolence   . Butorphanol Tartrate     Other reaction(s): Other (See Comments), Other (See Comments) drowsiness drowsiness somnolence   . Lactose Intolerance (Gi) Other (See Comments)    Reflux and  Indigestion     Current Outpatient Medications:  .  aspirin EC 81 MG tablet, Take 81 mg by mouth daily., Disp: , Rfl:  .  atorvastatin (LIPITOR) 40 MG tablet, TAKE 1 TABLET BY MOUTH EVERY DAY, Disp: 90 tablet, Rfl: 3 .  benazepril (LOTENSIN) 40 MG tablet, TAKE 1 TABLET BY MOUTH EVERY DAY, Disp: 90 tablet, Rfl: 3 .  doxycycline (PERIOSTAT) 20 MG tablet, Take 1 tablet (20 mg total) by mouth 2 (two) times daily., Disp: 180 tablet, Rfl: 1 .  fexofenadine (ALLEGRA) 180 MG tablet, Take 180 mg by mouth daily as needed for allergies. , Disp: , Rfl:  .  fluconazole (DIFLUCAN) 150 MG tablet, Take 150 mg by mouth daily. PRN, Disp: , Rfl:  .  glipiZIDE (GLUCOTROL) 10 MG tablet, TAKE 1 TABLET (10 MG TOTAL) BY MOUTH DAILY BEFORE BREAKFAST., Disp: 90 tablet, Rfl: 3 .  ketorolac (TORADOL) 10 MG tablet, Take 1 tablet (10 mg total) by mouth every 8 (eight) hours as needed for moderate pain (with food)., Disp: 15 tablet, Rfl: 0 .  loratadine (CLARITIN) 10 MG tablet, Take 10 mg by mouth daily as needed for allergies. , Disp: , Rfl:  .  metFORMIN (GLUCOPHAGE) 1000 MG tablet, TAKE 1 TABLET BY MOUTH 2 TIMES DAILY WITH A MEAL., Disp: 180 tablet, Rfl: 3 .  metoCLOPramide (REGLAN) 5 MG/ML injection, Inject 10 mg into the vein daily as needed (migraine). , Disp: , Rfl:  .  Multiple Vitamin (MULTIVITAMIN) capsule,  Take 1 capsule by mouth daily., Disp: , Rfl:  .  naproxen (NAPROSYN) 500 MG tablet, Take 1 tablet (500 mg total) by mouth 2 (two) times daily as needed., Disp: 180 tablet, Rfl: 1 .  nitroGLYCERIN (NITROSTAT) 0.4 MG SL tablet, Place 1 tablet (0.4 mg total) under the tongue every 5 (five) minutes as needed for chest pain., Disp: 50 tablet, Rfl: 3 .  omeprazole (PRILOSEC) 20 MG capsule, TAKE 1 CAPSULE BY MOUTH 2 TIMES DAILY BEFORE A MEAL., Disp: 180 capsule, Rfl: 3 .  prochlorperazine (COMPAZINE) 10 MG tablet, Take 1 tablet (10 mg total) by mouth every 6 (six) hours as needed for nausea or vomiting (migraine)., Disp:  15 tablet, Rfl: 0 .  promethazine (PHENERGAN) 25 MG tablet, Take 1 tablet (25 mg total) by mouth every 6 (six) hours as needed for nausea or vomiting., Disp: 35 tablet, Rfl: 0 .  RESTASIS 0.05 % ophthalmic emulsion, , Disp: , Rfl:  .  TRULICITY A999333 [id number] SOPN, INJECT 0.75 MG INTO THE SKIN ONCE A WEEK., Disp: 4 pen, Rfl: 4  Review of Systems  Social History   Tobacco Use  . Smoking status: Never Smoker  . Smokeless tobacco: Never Used  Substance Use Topics  . Alcohol use: Yes    Alcohol/week: 2.0 standard drinks    Types: 1 Glasses of wine, 1 Shots of liquor per week    Comment: average varies, possibly once monthly      Objective:   There were no vitals taken for this visit. There were no vitals filed for this visit.There is no height or weight on file to calculate BMI.   Physical Exam   No results found for any visits on 09/26/19.     Assessment & Plan    1. Sinusitis, unspecified chronicity, unspecified location Most likely this is sinusitis.  Will treat with Augmentin.  She has no antibiotic allergies.  2. Cough Encourage patient to contact the hospital and set up Covid PCR test.  She has exposure to her husband and her elderly mother-in-law who is in poor health Also try Robitussin and increase fluids. 3. Type 2 diabetes mellitus without complication, without long-term current use of insulin ([place])   I discussed the assessment and treatment plan with the patient. The patient was provided an opportunity to ask questions and all were answered. The patient agreed with the plan and demonstrated an understanding of the instructions.   The patient was advised to call back or seek an in-person evaluation if the symptoms worsen or if the condition fails to improve as anticipated.  I provided 12 minutes of non-face-to-face time during this encounter.      Richard Cranford Mon, MD  [facility]

## 2019-09-26 ENCOUNTER — Ambulatory Visit: Payer: BLUE CROSS/BLUE SHIELD | Attending: Internal Medicine

## 2019-09-26 ENCOUNTER — Ambulatory Visit (INDEPENDENT_AMBULATORY_CARE_PROVIDER_SITE_OTHER): Payer: BLUE CROSS/BLUE SHIELD | Admitting: Family Medicine

## 2019-09-26 DIAGNOSIS — J410 Simple chronic bronchitis: Secondary | ICD-10-CM

## 2019-09-26 DIAGNOSIS — R059 Cough, unspecified: Secondary | ICD-10-CM

## 2019-09-26 DIAGNOSIS — I152 Hypertension secondary to endocrine disorders: Secondary | ICD-10-CM

## 2019-09-26 DIAGNOSIS — E119 Type 2 diabetes mellitus without complications: Secondary | ICD-10-CM

## 2019-09-26 DIAGNOSIS — J329 Chronic sinusitis, unspecified: Secondary | ICD-10-CM | POA: Diagnosis not present

## 2019-09-26 DIAGNOSIS — R05 Cough: Secondary | ICD-10-CM | POA: Diagnosis not present

## 2019-09-26 DIAGNOSIS — Z20822 Contact with and (suspected) exposure to covid-19: Secondary | ICD-10-CM

## 2019-09-26 DIAGNOSIS — E1159 Type 2 diabetes mellitus with other circulatory complications: Secondary | ICD-10-CM

## 2019-09-26 MED ORDER — AMOXICILLIN-POT CLAVULANATE 875-125 MG PO TABS: 1.0000 | ORAL_TABLET | Freq: Two times a day (BID) | ORAL | 0 refills | Status: DC

## 2019-09-28 LAB — NOVEL CORONAVIRUS, NAA: SARS-CoV-2, NAA: NOT DETECTED

## 2019-10-02 ENCOUNTER — Telehealth: Payer: Self-pay | Admitting: General Practice

## 2019-10-02 NOTE — Telephone Encounter (Signed)
Gave patient negative covid test results Patient understood 

## 2019-10-23 ENCOUNTER — Ambulatory Visit
Admission: RE | Admit: 2019-10-23 | Discharge: 2019-10-23 | Disposition: A | Discharge status: Home / Self Care | Payer: BLUE CROSS/BLUE SHIELD | Source: Ambulatory Visit | Attending: Adult Health | Admitting: Adult Health

## 2019-10-23 ENCOUNTER — Ambulatory Visit (INDEPENDENT_AMBULATORY_CARE_PROVIDER_SITE_OTHER): Payer: BLUE CROSS/BLUE SHIELD | Admitting: Adult Health

## 2019-10-23 ENCOUNTER — Encounter: Payer: Self-pay | Admitting: Adult Health

## 2019-10-23 ENCOUNTER — Other Ambulatory Visit: Payer: Self-pay

## 2019-10-23 VITALS — BP 130/88 | HR 111 | Temp 97.8°F | Resp 16 | Wt 191.4 lb

## 2019-10-23 DIAGNOSIS — M545 Low back pain, unspecified: Secondary | ICD-10-CM

## 2019-10-23 DIAGNOSIS — R1032 Left lower quadrant pain: Secondary | ICD-10-CM | POA: Diagnosis not present

## 2019-10-23 LAB — POCT URINALYSIS DIPSTICK
Bilirubin, UA: NEGATIVE
Blood, UA: NEGATIVE
Glucose, UA: NEGATIVE
Ketones, UA: NEGATIVE
Leukocytes, UA: NEGATIVE
Nitrite, UA: NEGATIVE
Protein, UA: NEGATIVE
Spec Grav, UA: 1.015 (ref 1.010–1.025)
Urobilinogen, UA: 0.2 E.U./dL
pH, UA: 6 (ref 5.0–8.0)

## 2019-10-23 LAB — POCT URINE PREGNANCY: Preg Test, Ur: NEGATIVE

## 2019-10-23 IMAGING — CR DG LUMBAR SPINE COMPLETE 4+V
1 series · 5 of 5 positions shown · non-contrast
Comparison: None.

CLINICAL DATA: Left sided lower back pain; possible kidney
stoneleft sided lower back pain

EXAM:
LUMBAR SPINE - COMPLETE 4+ VIEW

[Series 1: dg lumbar spine complete 4 +v · 0.14mm/px · 5 of 5 slices shown]
[im 1/5]
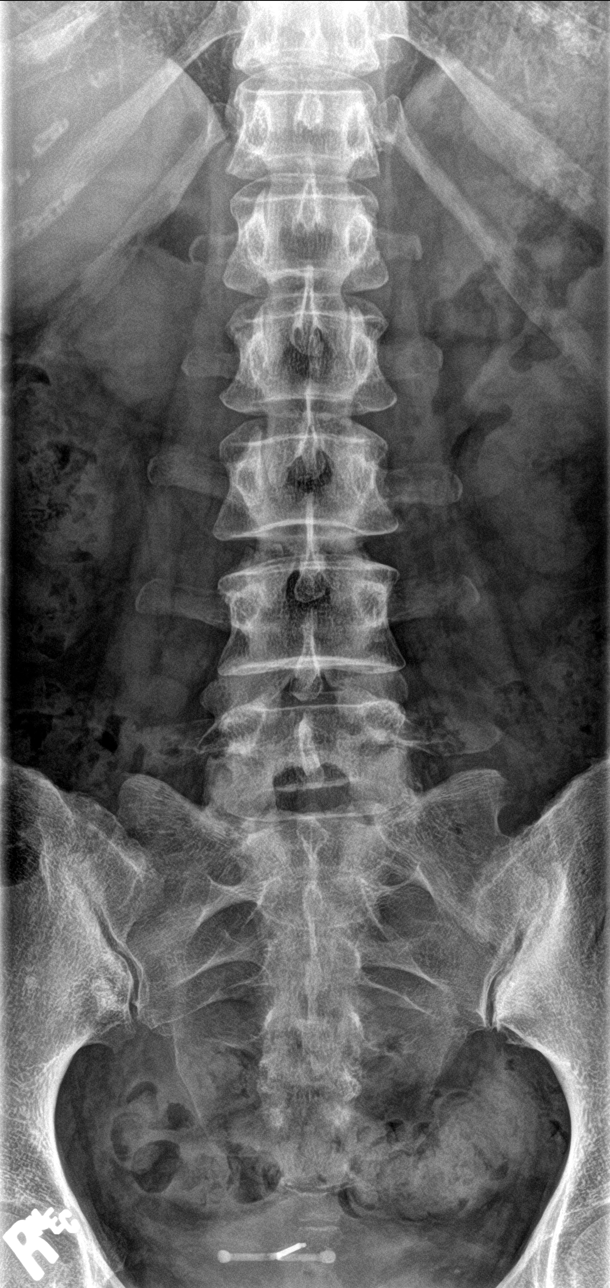
[im 2/5]
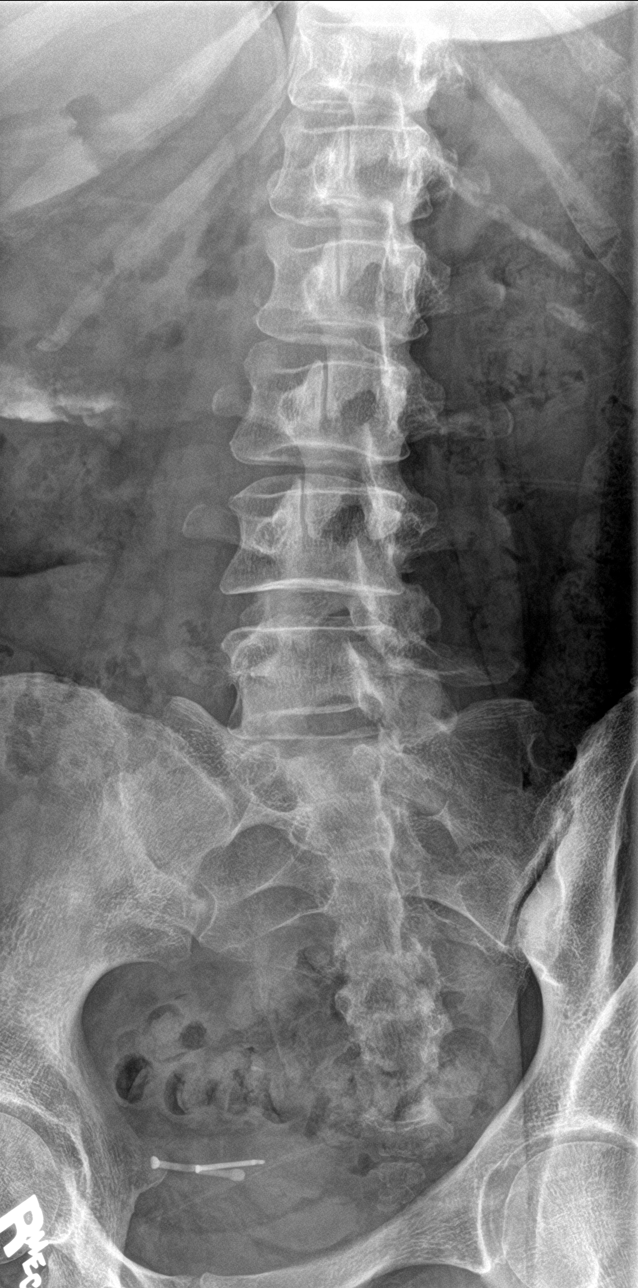
[im 3/5]
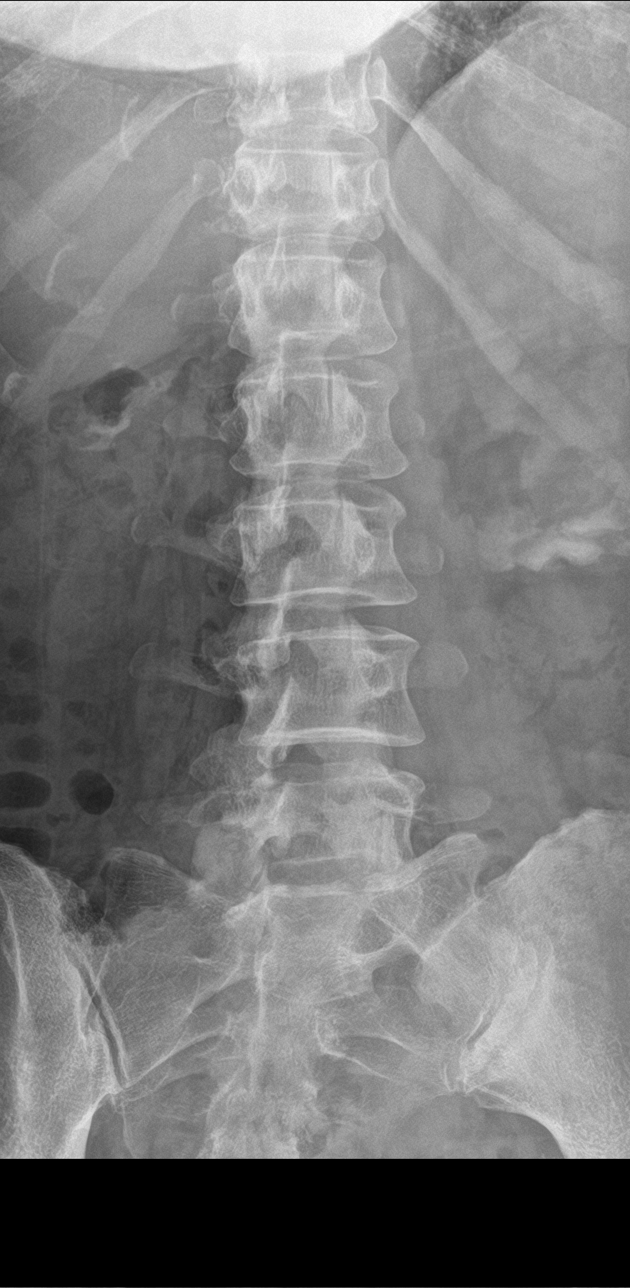
[im 4/5]
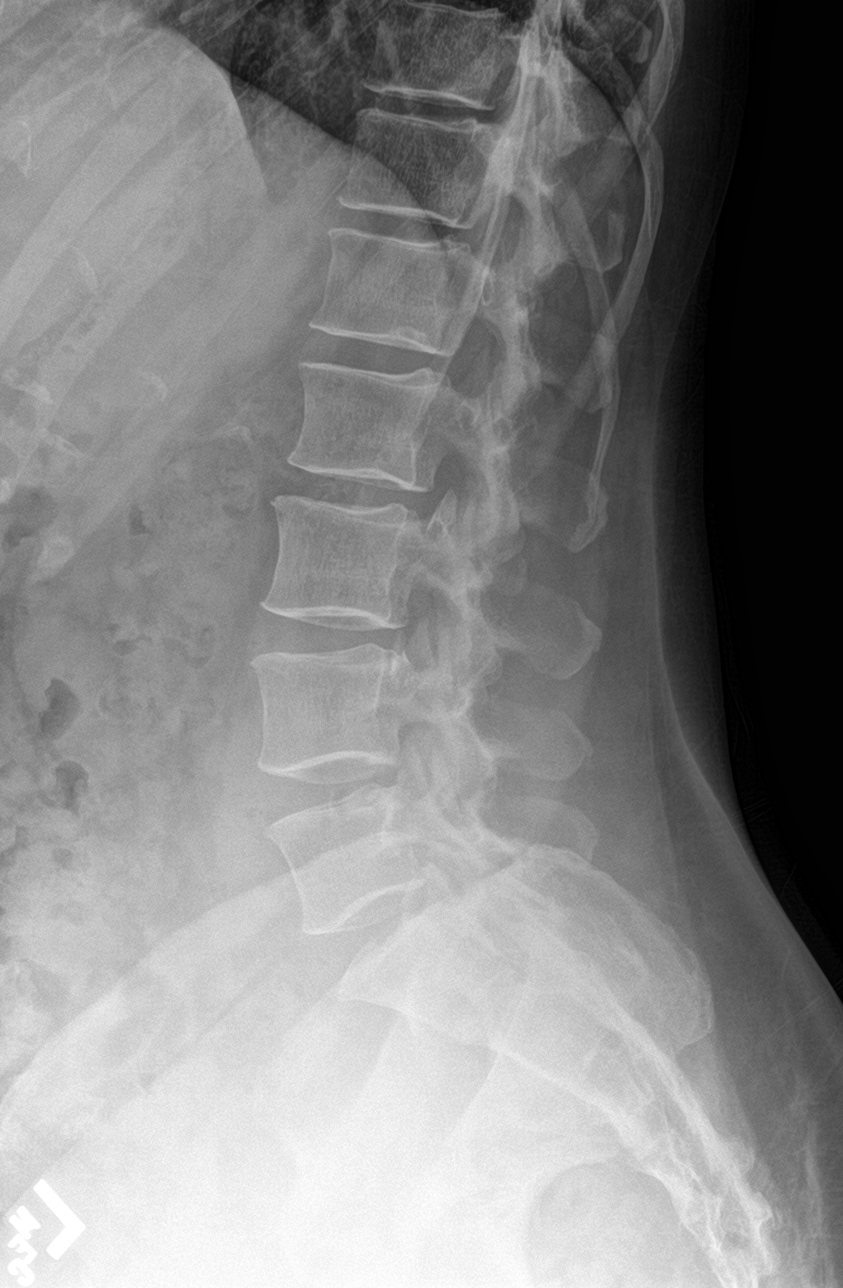
[im 5/5]
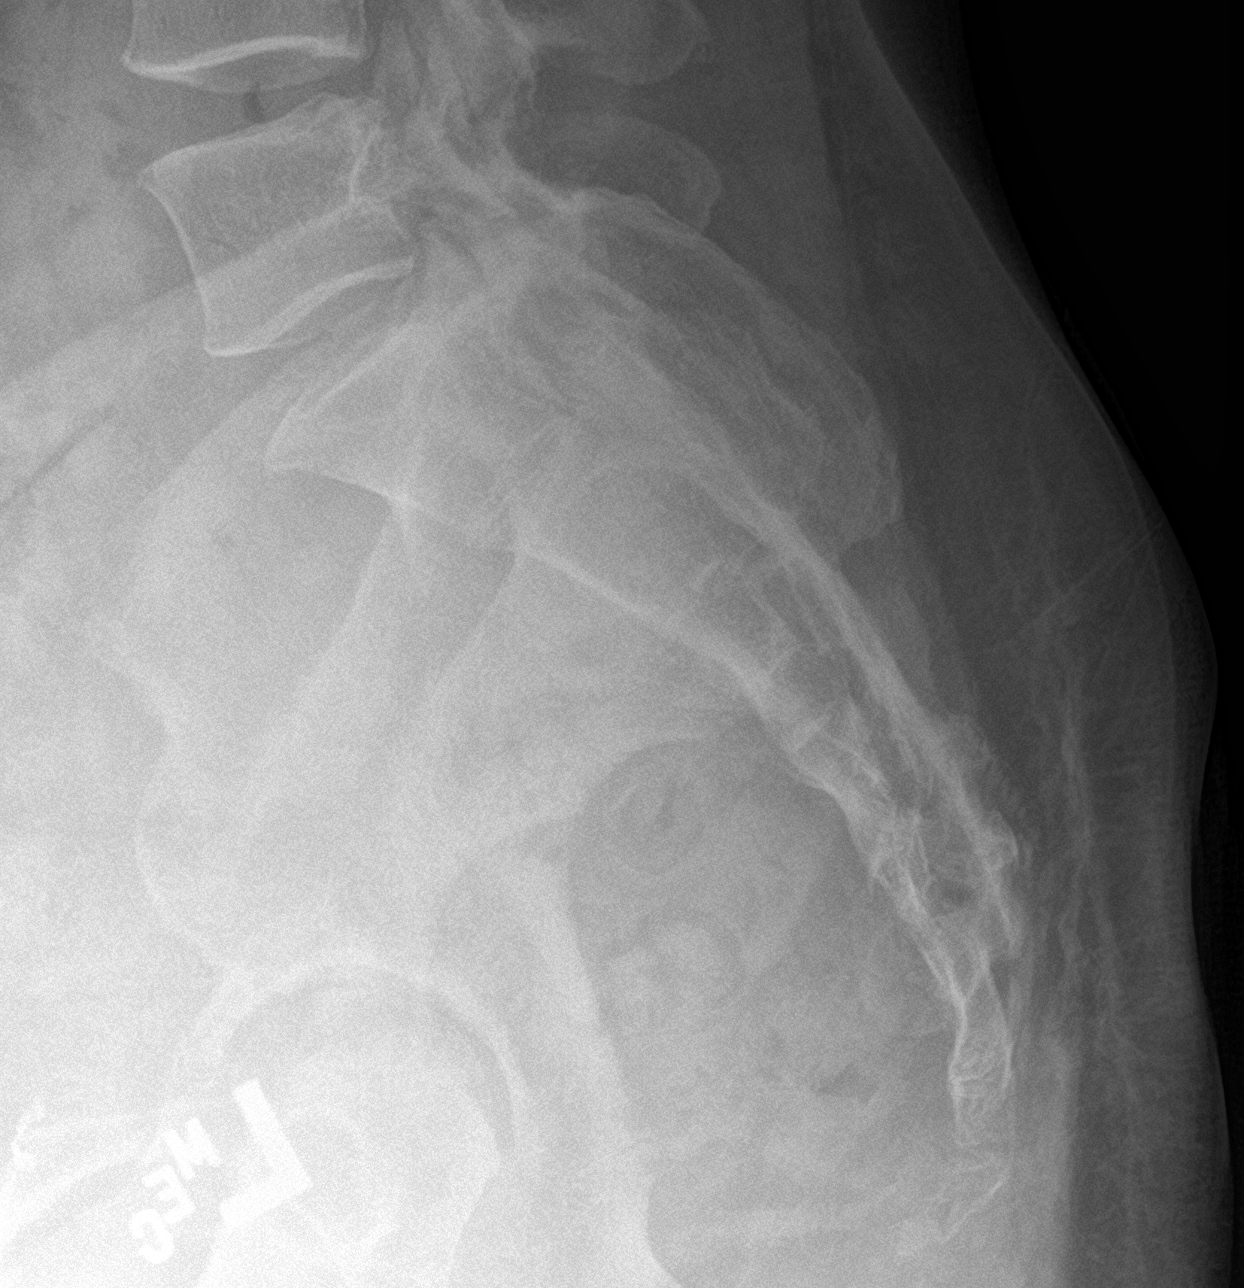

[5 of 5 positions shown; findings below may reference images not displayed]

FINDINGS: Normal alignment of lumbar vertebral bodies. No loss of vertebral
body height or disc height. No pars fracture. No subluxation.
IMPRESSION: No acute findings lumbar spine.

## 2019-10-23 IMAGING — US US PELVIS COMPLETE TRANSABD/TRANSVAG W DUPLEX
2 series · 13 of 25 positions shown · non-contrast
Comparison: None

CLINICAL DATA: Pain

EXAM:
TRANSABDOMINAL AND TRANSVAGINAL ULTRASOUND OF PELVIS
TECHNIQUE: Both transabdominal and transvaginal ultrasound examinations of the
pelvis were performed. Transabdominal technique was performed for
global imaging of the pelvis including uterus, ovaries, adnexal
regions, and pelvic cul-de-sac. It was necessary to proceed with
endovaginal exam following the transabdominal exam to visualize the
right ovary.

[Series 1: us pelvic complete w transvaginal and torsion righ · 12 of 75 slices shown]
[im 1/75]
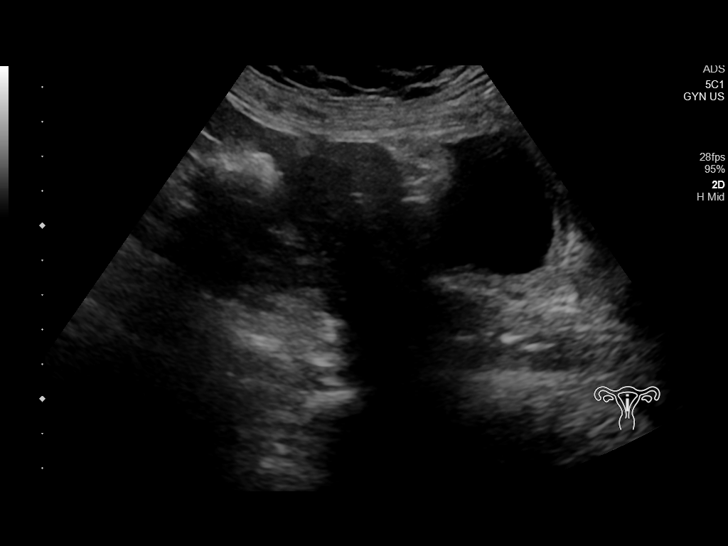
[im 7/75]
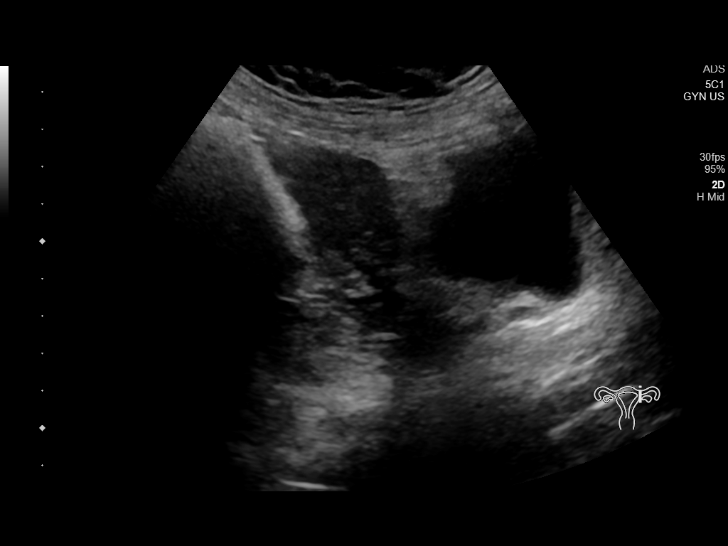
[im 13/75]
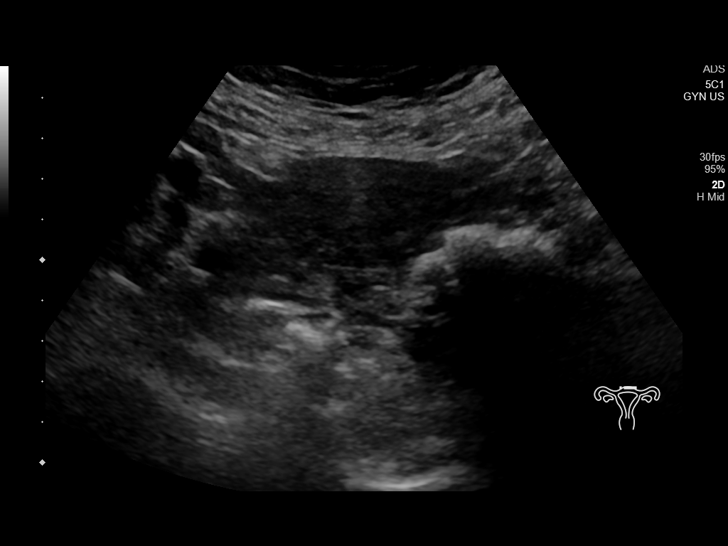
[im 20/75]
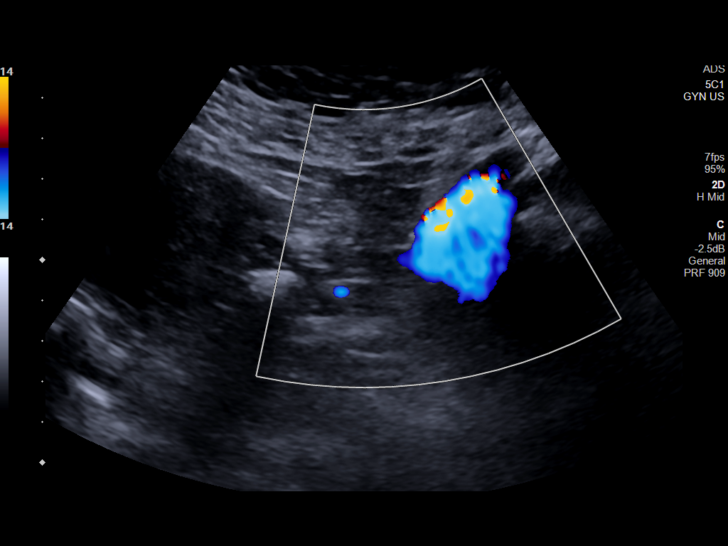
[im 26/75]
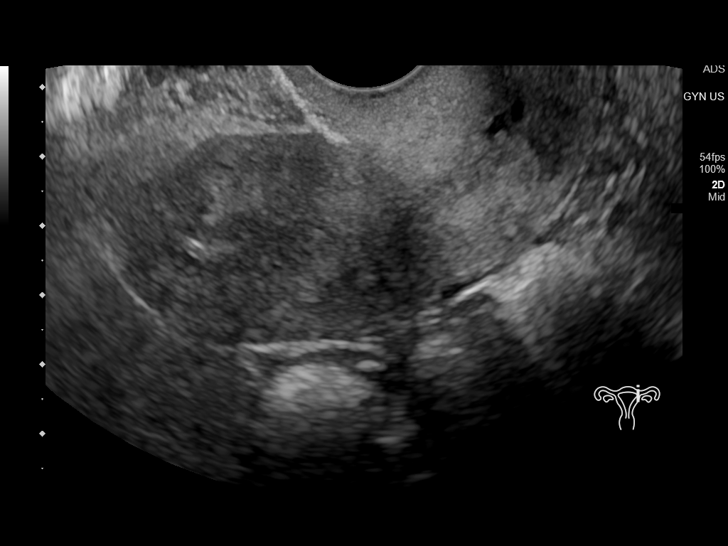
[im 33/75]
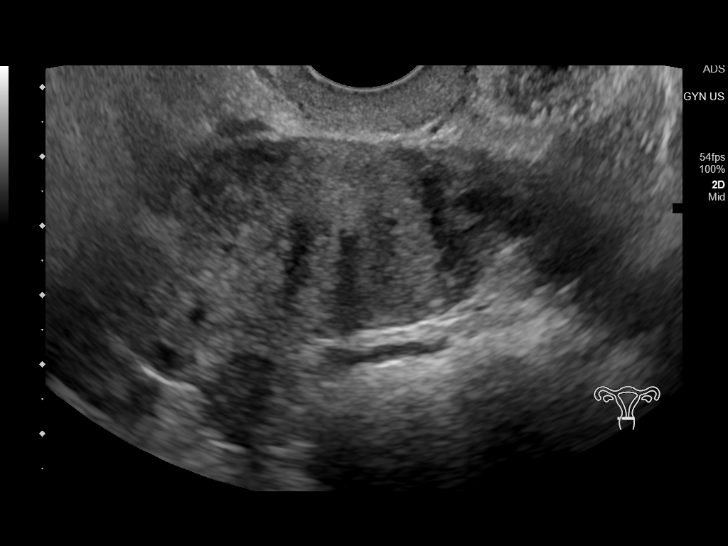
[im 39/75]
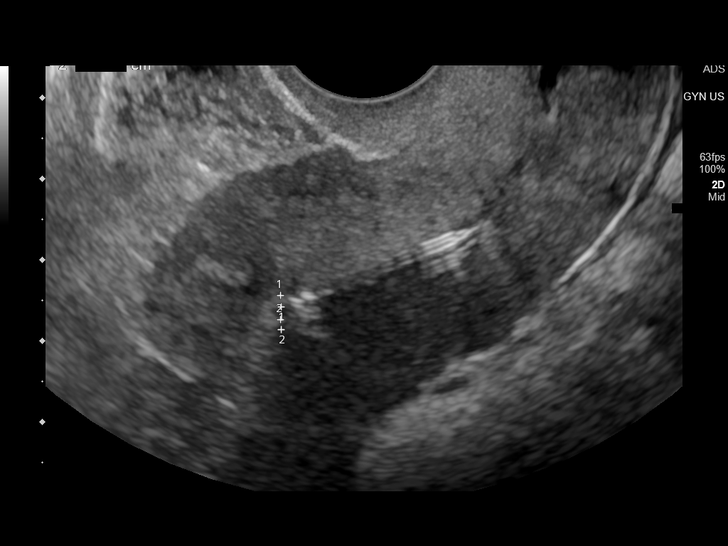
[im 46/75]
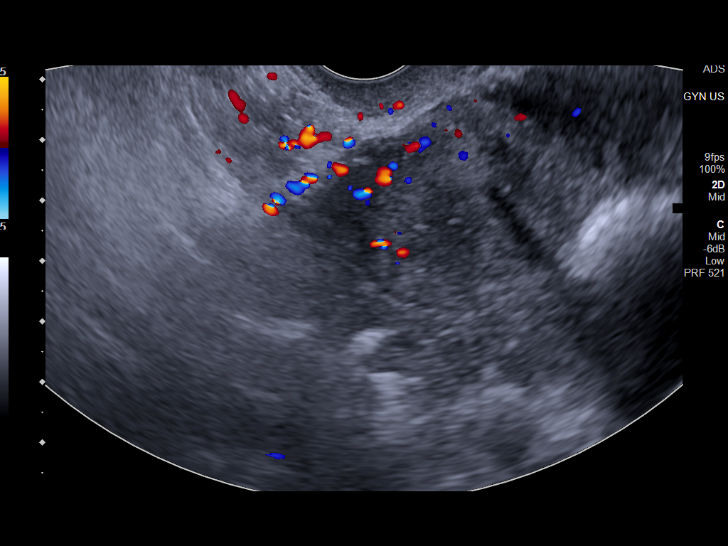
[im 52/75]
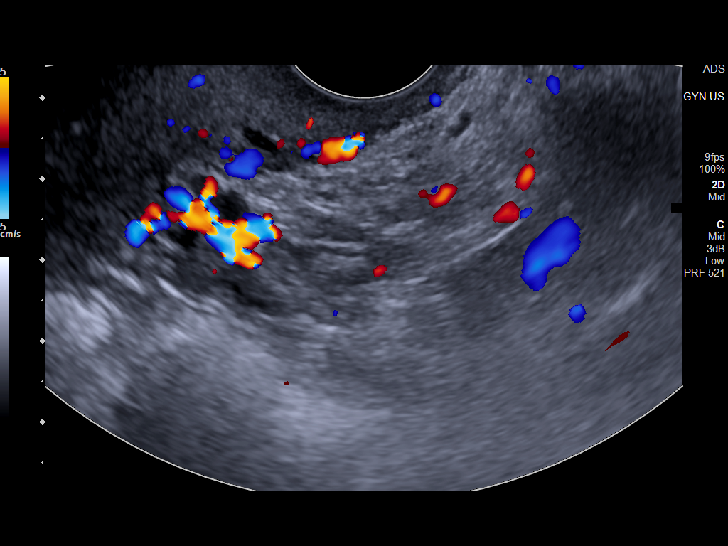
[im 58/75]
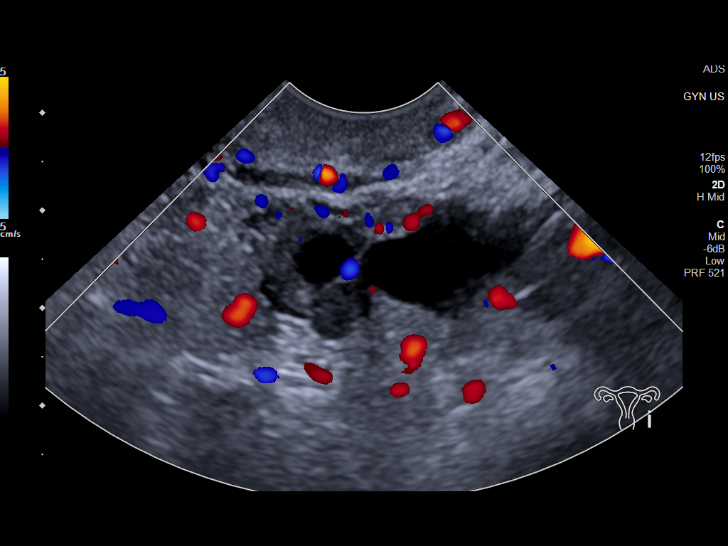
[im 65/75]
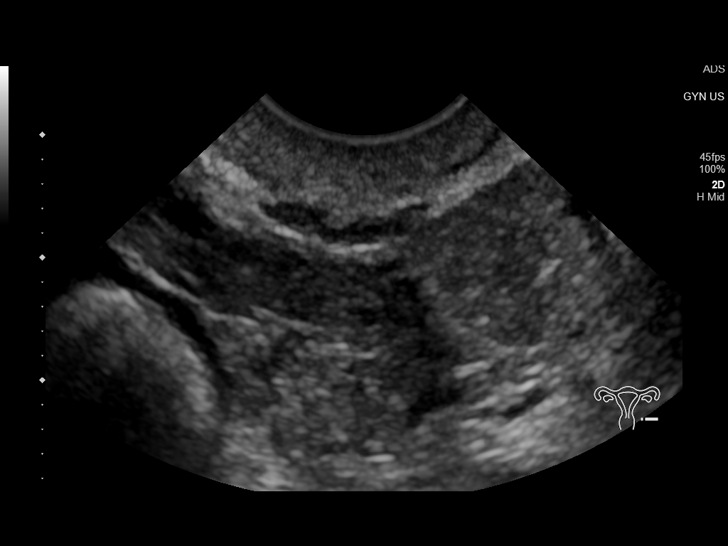
[im 71/75]
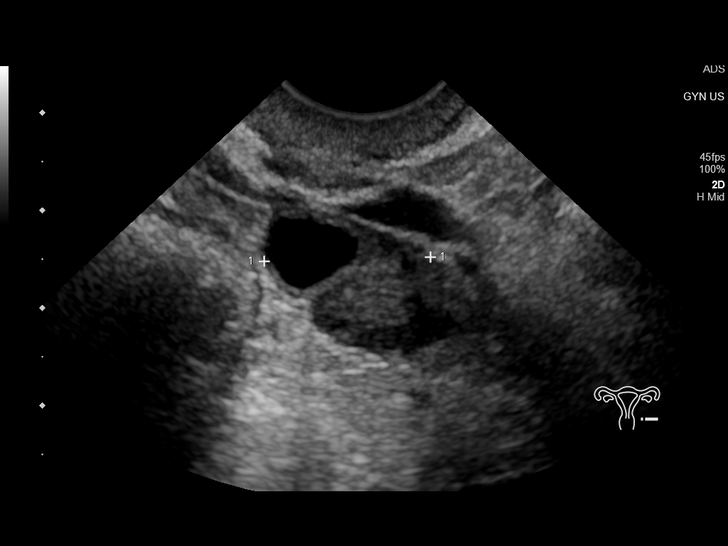

[Series 1001: gyn us · 1 of 2 slices shown]
[im 1/2]
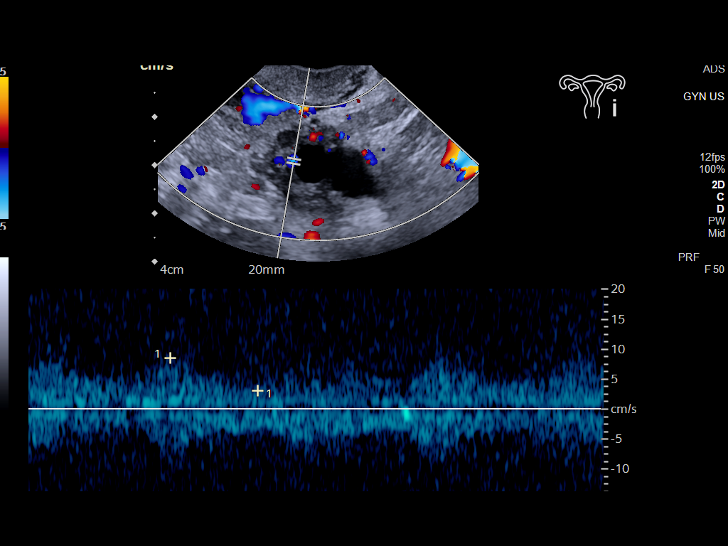

[13 of 25 positions shown; findings below may reference images not displayed]

FINDINGS: Uterus

Measurements: 7.1 x 3.3 x 4.9 cm = volume: 60 mL. No fibroids or
other mass visualized.

Endometrium

Thickness: 3 mm.  An IUD is noted.

Right ovary

Not visualized

Left ovary

Measurements: 2.4 x 1.4 x 1.7 cm = volume: 3 mL. There is a 1.5 x
1.5 x 1.4 cm exophytic cystic structure that appears to be arising
from the left ovary. This is not clearly separate from the left
ovary and may represent a periovarian cyst or dominant follicle.

Other findings

No abnormal free fluid.
IMPRESSION: 1. No acute abnormality.  Right ovary was not visualized.
2. IUD in place.
3. Cystic structure measuring 1.5 cm as detailed above may represent
a left-sided dominant follicle or left-sided paraovarian cyst.

## 2019-10-23 MED ORDER — KETOROLAC TROMETHAMINE 60 MG/2ML IM SOLN
60.0000 mg | Freq: Once | INTRAMUSCULAR | Status: AC
  Administered 2019-10-23: 60 mg via INTRAMUSCULAR

## 2019-10-23 NOTE — Patient Instructions (Signed)
Acute Back Pain, Adult Acute back pain is sudden and usually short-lived. It is often caused by an injury to the muscles and tissues in the back. The injury may result from:  A muscle or ligament getting overstretched or torn (strained). Ligaments are tissues that connect bones to each other. Lifting something improperly can cause a back strain.  Wear and tear (degeneration) of the spinal disks. Spinal disks are circular tissue that provides cushioning between the bones of the spine (vertebrae).  Twisting motions, such as while playing sports or doing yard work.  A hit to the back.  Arthritis. You may have a physical exam, lab tests, and imaging tests to find the cause of your pain. Acute back pain usually goes away with rest and home care. Follow these instructions at home: Managing pain, stiffness, and swelling  Take over-the-counter and prescription medicines only as told by your health care provider.  Your health care provider may recommend applying ice during the first 24-48 hours after your pain starts. To do this: ? Put ice in a plastic bag. ? Place a towel between your skin and the bag. ? Leave the ice on for 20 minutes, 2-3 times a day.  If directed, apply heat to the affected area as often as told by your health care provider. Use the heat source that your health care provider recommends, such as a moist heat pack or a heating pad. ? Place a towel between your skin and the heat source. ? Leave the heat on for 20-30 minutes. ? Remove the heat if your skin turns bright red. This is especially important if you are unable to feel pain, heat, or cold. You have a greater risk of getting burned. Activity   Do not stay in bed. Staying in bed for more than 1-2 days can delay your recovery.  Sit up and stand up straight. Avoid leaning forward when you sit, or hunching over when you stand. ? If you work at a desk, sit close to it so you do not need to lean over. Keep your chin tucked  in. Keep your neck drawn back, and keep your elbows bent at a right angle. Your arms should look like the letter "L." ? Sit high and close to the steering wheel when you drive. Add lower back (lumbar) support to your car seat, if needed.  Take short walks on even surfaces as soon as you are able. Try to increase the length of time you walk each day.  Do not sit, drive, or stand in one place for more than 30 minutes at a time. Sitting or standing for long periods of time can put stress on your back.  Do not drive or use heavy machinery while taking prescription pain medicine.  Use proper lifting techniques. When you bend and lift, use positions that put less stress on your back: ? Bend your knees. ? Keep the load close to your body. ? Avoid twisting.  Exercise regularly as told by your health care provider. Exercising helps your back heal faster and helps prevent back injuries by keeping muscles strong and flexible.  Work with a physical therapist to make a safe exercise program, as recommended by your health care provider. Do any exercises as told by your physical therapist. Lifestyle  Maintain a healthy weight. Extra weight puts stress on your back and makes it difficult to have good posture.  Avoid activities or situations that make you feel anxious or stressed. Stress and anxiety increase muscle   tension and can make back pain worse. Learn ways to manage anxiety and stress, such as through exercise. °General instructions °· Sleep on a firm mattress in a comfortable position. Try lying on your side with your knees slightly bent. If you lie on your back, put a pillow under your knees. °· Follow your treatment plan as told by your health care provider. This may include: °? Cognitive or behavioral therapy. °? Acupuncture or massage therapy. °? Meditation or yoga. °Contact a health care provider if: °· You have pain that is not relieved with rest or medicine. °· You have increasing pain going down  into your legs or buttocks. °· Your pain does not improve after 2 weeks. °· You have pain at night. °· You lose weight without trying. °· You have a fever or chills. °Get help right away if: °· You develop new bowel or bladder control problems. °· You have unusual weakness or numbness in your arms or legs. °· You develop nausea or vomiting. °· You develop abdominal pain. °· You feel faint. °Summary °· Acute back pain is sudden and usually short-lived. °· Use proper lifting techniques. When you bend and lift, use positions that put less stress on your back. °· Take over-the-counter and prescription medicines and apply heat or ice as directed by your health care provider. °This information is not intended to replace advice given to you by your health care provider. Make sure you discuss any questions you have with your health care provider. °Document Revised: 11/14/2018 Document Reviewed: 03/09/2017 °Elsevier Patient Education © 2020 [employer] °Abdominal Pain, Adult °Many things can cause belly (abdominal) pain. Most times, belly pain is not dangerous. Many cases of belly pain can be watched and treated at home. Sometimes, though, belly pain is serious. Your doctor will try to find the cause of your belly pain. °Follow these instructions at home: ° °Medicines °· Take over-the-counter and prescription medicines only as told by your doctor. °· Do not take medicines that help you poop (laxatives) unless told by your doctor. °General instructions °· Watch your belly pain for any changes. °· Drink enough fluid to keep your pee (urine) pale yellow. °· Keep all follow-up visits as told by your doctor. This is important. °Contact a doctor if: °· Your belly pain changes or gets worse. °· You are not hungry, or you lose weight without trying. °· You are having trouble pooping (constipated) or have watery poop (diarrhea) for more than 2-3 days. °· You have pain when you pee or poop. °· Your belly pain wakes you up at  night. °· Your pain gets worse with meals, after eating, or with certain foods. °· You are vomiting and cannot keep anything down. °· You have a fever. °· You have blood in your pee. °Get help right away if: °· Your pain does not go away as soon as your doctor says it should. °· You cannot stop vomiting. °· Your pain is only in areas of your belly, such as the right side or the left lower part of the belly. °· You have bloody or black poop, or poop that looks like tar. °· You have very bad pain, cramping, or bloating in your belly. °· You have signs of not having enough fluid or water in your body (dehydration), such as: °? Dark pee, very little pee, or no pee. °? Cracked lips. °? Dry mouth. °? Sunken eyes. °? Sleepiness. °? Weakness. °· You have trouble breathing or chest pain. °Summary °·   Many cases of belly pain can be watched and treated at home. °· Watch your belly pain for any changes. °· Take over-the-counter and prescription medicines only as told by your doctor. °· Contact a doctor if your belly pain changes or gets worse. °· Get help right away if you have very bad pain, cramping, or bloating in your belly. °This information is not intended to replace advice given to you by your health care provider. Make sure you discuss any questions you have with your health care provider. °Document Revised: 12/04/2018 Document Reviewed: 12/04/2018 °Elsevier Patient Education © 2020 [employer] ° °

## 2019-10-23 NOTE — Progress Notes (Addendum)
Patient: Judith Fox Female    DOB: July 14, 1966   54 y.o.   MRN: [id number] Visit Date: 10/23/2019  Today's Provider: Marcille Buffy, FNP   Chief Complaint  Patient presents with  . Back Pain   Subjective:     Back Pain This is a new problem. The problem occurs intermittently. The problem has been gradually worsening since onset. The pain is present in the lumbar spine. The quality of the pain is described as shooting and stabbing. The pain does not radiate. The pain is moderate. The symptoms are aggravated by position, lying down and twisting. Associated symptoms include abdominal pain (LLQ) and pelvic pain (left side). Pertinent negatives include no bladder incontinence, bowel incontinence, chest pain, dysuria, fever, headaches, leg pain, numbness, paresis, paresthesias, perianal numbness, tingling, weakness or weight loss. Risk factors: history of kidney stones.  Onset 14 days, she reports some days are worse than other days.   She has had pain in her lower back left side that has radiated to her left abdomen.. Denies dysuria. She has no hematuria.Urinalysis in office is normal. She feels certain she has " another kidney stone like when I was in [place] working around a year ago and had to lay down on the floor it hurt so bad".  She has no urinary symptoms, change in stream, dysuria.  She does have history frequent yeast infections.   She denies any abnormal vaginal bleeding. She denies nausea. She has Mirena IUD reports she is due to have it changed.  She denies any constipation, mucous in stools, change in stools, blood or dark tarry stools or any other issue. She has regular bowel movements. Denies loose stools or diarrhea.   History of Left medial femoral neck: - she saw Reche Dixon PA-C - she did not have surgery / conservative therapy was taken. She did PT.She reports she had a kidney stone she feels she feels she passed around a year and half ago while  she was in [place] on a business trip. She has had renal ultrasound here and a KUB in past as well with Dr. Rosanna Randy that was negative for stone.   Patient  denies any fever, body chills, rash, chest pain, shortness of breath, nausea, vomiting, or diarrhea.    No LMP recorded. (Menstrual status: IUD).    Allergies  Allergen Reactions  . Latex Rash and Swelling    Eye swelling   . Butorphanol Other (See Comments)    Other reaction(s): Other (See Comments) drowsiness somnolence   . Butorphanol Tartrate     Other reaction(s): Other (See Comments), Other (See Comments) drowsiness drowsiness somnolence   . Lactose Intolerance (Gi) Other (See Comments)    Reflux and Indigestion     Current Outpatient Medications:  .  aspirin EC 81 MG tablet, Take 81 mg by mouth daily., Disp: , Rfl:  .  atorvastatin (LIPITOR) 40 MG tablet, TAKE 1 TABLET BY MOUTH EVERY DAY, Disp: 90 tablet, Rfl: 3 .  benazepril (LOTENSIN) 40 MG tablet, TAKE 1 TABLET BY MOUTH EVERY DAY, Disp: 90 tablet, Rfl: 3 .  doxycycline (PERIOSTAT) 20 MG tablet, Take 1 tablet (20 mg total) by mouth 2 (two) times daily., Disp: 180 tablet, Rfl: 1 .  fexofenadine (ALLEGRA) 180 MG tablet, Take 180 mg by mouth daily as needed for allergies. , Disp: , Rfl:  .  fluconazole (DIFLUCAN) 150 MG tablet, Take 150 mg by mouth daily. PRN, Disp: , Rfl:  .  glipiZIDE (GLUCOTROL) 10 MG tablet, TAKE 1 TABLET (10 MG TOTAL) BY MOUTH DAILY BEFORE BREAKFAST., Disp: 90 tablet, Rfl: 3 .  ketorolac (TORADOL) 10 MG tablet, Take 1 tablet (10 mg total) by mouth every 8 (eight) hours as needed for moderate pain (with food). (Patient taking differently: Take 10 mg by mouth every 8 (eight) hours as needed for moderate pain (with food). ), Disp: 15 tablet, Rfl: 0 .  loratadine (CLARITIN) 10 MG tablet, Take 10 mg by mouth daily as needed for allergies. , Disp: , Rfl:  .  metFORMIN (GLUCOPHAGE) 1000 MG tablet, TAKE 1 TABLET BY MOUTH 2 TIMES DAILY WITH A MEAL.,  Disp: 180 tablet, Rfl: 3 .  metoCLOPramide (REGLAN) 5 MG/ML injection, Inject 10 mg into the vein daily as needed (migraine). , Disp: , Rfl:  .  Multiple Vitamin (MULTIVITAMIN) capsule, Take 1 capsule by mouth daily., Disp: , Rfl:  .  naproxen (NAPROSYN) 500 MG tablet, Take 1 tablet (500 mg total) by mouth 2 (two) times daily as needed., Disp: 180 tablet, Rfl: 1 .  nitroGLYCERIN (NITROSTAT) 0.4 MG SL tablet, Place 1 tablet (0.4 mg total) under the tongue every 5 (five) minutes as needed for chest pain. (Patient taking differently: Place 0.4 mg under the tongue every 5 (five) minutes as needed for chest pain. ), Disp: 50 tablet, Rfl: 3 .  omeprazole (PRILOSEC) 20 MG capsule, TAKE 1 CAPSULE BY MOUTH 2 TIMES DAILY BEFORE A MEAL., Disp: 180 capsule, Rfl: 3 .  prochlorperazine (COMPAZINE) 10 MG tablet, Take 1 tablet (10 mg total) by mouth every 6 (six) hours as needed for nausea or vomiting (migraine). (Patient taking differently: Take 10 mg by mouth every 6 (six) hours as needed for nausea or vomiting (migraine). ), Disp: 15 tablet, Rfl: 0 .  promethazine (PHENERGAN) 25 MG tablet, Take 1 tablet (25 mg total) by mouth every 6 (six) hours as needed for nausea or vomiting. (Patient taking differently: Take 25 mg by mouth every 6 (six) hours as needed for nausea or vomiting. ), Disp: 35 tablet, Rfl: 0 .  RESTASIS 0.05 % ophthalmic emulsion, , Disp: , Rfl:  .  TRULICITY [id number] SOPN, INJECT 0.75 MG INTO THE SKIN ONCE A WEEK., Disp: 4 pen, Rfl: 4  Review of Systems  Constitutional: Positive for diaphoresis (occasional with pain ). Negative for activity change, appetite change, chills, fatigue, fever, unexpected weight change and weight loss.  Respiratory: Negative.   Cardiovascular: Negative.  Negative for chest pain.  Gastrointestinal: Positive for abdominal pain (LLQ). Negative for abdominal distention, anal bleeding, blood in stool, bowel incontinence, constipation, diarrhea, nausea, rectal pain and  vomiting.  Genitourinary: Positive for flank pain, hematuria and pelvic pain (left side). Negative for bladder incontinence, decreased urine volume, difficulty urinating, dyspareunia, dysuria, enuresis, frequency, genital sores, menstrual problem, urgency, vaginal bleeding, vaginal discharge and vaginal pain.  Musculoskeletal: Positive for back pain (left side lower ). Negative for arthralgias, gait problem, joint swelling, myalgias, neck pain and neck stiffness.  Skin: Negative for color change and rash.  Neurological: Negative for dizziness, tingling, weakness, numbness, headaches and paresthesias.  Hematological: Does not bruise/bleed easily.  Psychiatric/Behavioral: Negative.     Social History   Tobacco Use  . Smoking status: Never Smoker  . Smokeless tobacco: Never Used  Substance Use Topics  . Alcohol use: Yes    Alcohol/week: 2.0 standard drinks    Types: 1 Glasses of wine, 1 Shots of liquor per week    Comment: average varies, possibly  once monthly      Objective:    Physical Exam Vitals reviewed.  Constitutional:      General: She is not in acute distress.    Appearance: Normal appearance. She is not ill-appearing, toxic-appearing or diaphoretic.     Comments: She has a guarded walk and movement on exam table.   Patient is alert and oriented and responsive to questions Engages in eye contact with provider. Speaks in full sentences without any pauses without any shortness of breath or distress.    HENT:     Head: Normocephalic and atraumatic.     Right Ear: There is no impacted cerumen.     Left Ear: There is no impacted cerumen.     Nose: Nose normal.     Mouth/Throat:     Mouth: Mucous membranes are moist.     Pharynx: No oropharyngeal exudate or posterior oropharyngeal erythema.  Eyes:     General: No scleral icterus.       Right eye: No discharge.        Left eye: No discharge.     Extraocular Movements: Extraocular movements intact.     Conjunctiva/sclera:  Conjunctivae normal.     Pupils: Pupils are equal, round, and reactive to light.  Neck:     Vascular: No carotid bruit.  Cardiovascular:     Rate and Rhythm: Normal rate and regular rhythm.     Pulses: Normal pulses.     Heart sounds: Normal heart sounds. No murmur. No friction rub. No gallop.   Pulmonary:     Effort: Pulmonary effort is normal. No respiratory distress.     Breath sounds: Normal breath sounds. No stridor. No wheezing, rhonchi or rales.  Chest:     Chest wall: No tenderness.  Abdominal:     General: There is no distension or abdominal bruit. There are no signs of injury.     Palpations: Abdomen is soft. There is no fluid wave or pulsatile mass.     Tenderness: There is abdominal tenderness in the left lower quadrant. There is guarding. There is no right CVA tenderness, left CVA tenderness or rebound. Negative signs include Murphy's sign, Rovsing's sign, McBurney's sign, psoas sign and obturator sign.     Hernia: No hernia is present.       Comments: Pain in area LLQ on exam with deep palpation.   Jump test negative. No right sided pain with deep or light palpation.    Musculoskeletal:        General: Normal range of motion.     Cervical back: Normal range of motion and neck supple. No rigidity or tenderness.     Thoracic back: Normal.     Lumbar back: Normal. No tenderness or bony tenderness. Negative right straight leg raise test and negative left straight leg raise test.     Comments: No cva tenderness bilaterally.   Lymphadenopathy:     Cervical: No cervical adenopathy.  Skin:    General: Skin is warm and dry.     Capillary Refill: Capillary refill takes less than 2 seconds.  Neurological:     General: No focal deficit present.     Mental Status: She is alert and oriented to person, place, and time.     Cranial Nerves: No cranial nerve deficit.     Sensory: No sensory deficit.     Motor: No weakness.     Coordination: Coordination normal.     Gait: Gait  normal.  Deep Tendon Reflexes: Reflexes normal.  Psychiatric:        Mood and Affect: Mood normal.        Behavior: Behavior normal.        Thought Content: Thought content normal.        Judgment: Judgment normal.      No results found for any visits on 10/23/19.     Assessment & Plan    Suspect ovarian cyst, rule out torsion, given patients guarding with walk and movement.     Left lower quadrant abdominal pain - Plan: CBC with Differential/Platelet, Comprehensive Metabolic Panel (CMET), POCT urinalysis dipstick, ketorolac (TORADOL) injection 60 mg, US PELVIC COMPLETE W TRANSVAGINAL AND TORSION R/O, DG Lumbar Spine Complete, DG Abd 1 View, POCT urine pregnancy, CANCELED: DG Abd 1 View, CANCELED: DG Lumbar Spine Complete, CANCELED: US Pelvic Complete With Transvaginal, CANCELED: Pregnancy, urine, CANCELED: DG Abd 1 View, CANCELED: DG Lumbar Spine Complete  Acute left-sided low back pain without sciatica - Plan: ketorolac (TORADOL) injection 60 mg  She takes Toradol for migraines at home- denies any in the recent month, will give Toradol as above for pain relief.   Given her history of reported kidney stones will obtain a one view x ray and lumbar spine x ray to rule out back etiology. Urine was negative for any abnormality, she had negative urine pregnancy. HCG blood ordered given review or chart and IUD expired.   Return in about 1 week (around 10/30/2019), or if symptoms worsen or fail to improve, for at any time for any worsening symptoms, Go to Emergency room/ urgent care if worse.  Strict go to the emergency room symptoms discussed or if any symptoms worsening.  Patient verbalized understanding of all instructions given and denies any further questions at this time.   The entirety of the information documented in the History of Present Illness, Review of Systems and Physical Exam were personally obtained by me. Portions of this information were initially documented by the   Certified Medical Assistant whose name is documented in [place] and reviewed by me for thoroughness and accuracy.  I have personally performed the exam and reviewed the chart and it is accurate to the best of my knowledge.  Haematologist has been used and any errors in dictation or transcription are unintentional.  Kelby Aline. Beckett, [facility]

## 2019-10-24 ENCOUNTER — Telehealth: Payer: Self-pay | Admitting: Adult Health

## 2019-10-24 ENCOUNTER — Encounter: Payer: Self-pay | Admitting: Adult Health

## 2019-10-24 DIAGNOSIS — R1032 Left lower quadrant pain: Secondary | ICD-10-CM

## 2019-10-24 DIAGNOSIS — M545 Low back pain, unspecified: Secondary | ICD-10-CM | POA: Insufficient documentation

## 2019-10-24 DIAGNOSIS — R935 Abnormal findings on diagnostic imaging of other abdominal regions, including retroperitoneum: Secondary | ICD-10-CM

## 2019-10-24 DIAGNOSIS — Q504 Embryonic cyst of fallopian tube: Secondary | ICD-10-CM

## 2019-10-24 DIAGNOSIS — N83202 Unspecified ovarian cyst, left side: Secondary | ICD-10-CM

## 2019-10-24 LAB — CBC WITH DIFFERENTIAL/PLATELET
Basophils Absolute: 0 10*3/uL (ref 0.0–0.2)
Basos: 1 %
EOS (ABSOLUTE): 0.1 10*3/uL (ref 0.0–0.4)
Eos: 2 %
Hematocrit: 41.7 % (ref 34.0–46.6)
Hemoglobin: 13.3 g/dL (ref 11.1–15.9)
Immature Grans (Abs): 0 10*3/uL (ref 0.0–0.1)
Immature Granulocytes: 0 %
Lymphocytes Absolute: 2.2 10*3/uL (ref 0.7–3.1)
Lymphs: 30 %
MCH: 27.4 pg (ref 26.6–33.0)
MCHC: 31.9 g/dL (ref 31.5–35.7)
MCV: 86 fL (ref 79–97)
Monocytes Absolute: 0.5 10*3/uL (ref 0.1–0.9)
Monocytes: 7 %
Neutrophils Absolute: 4.6 10*3/uL (ref 1.4–7.0)
Neutrophils: 60 %
Platelets: 259 10*3/uL (ref 150–450)
RBC: 4.86 x10E6/uL (ref 3.77–5.28)
RDW: 12.8 % (ref 11.7–15.4)
WBC: 7.5 10*3/uL (ref 3.4–10.8)

## 2019-10-24 LAB — COMPREHENSIVE METABOLIC PANEL
ALT: 17 IU/L (ref 0–32)
AST: 22 IU/L (ref 0–40)
Albumin/Globulin Ratio: 2.3 — ABNORMAL HIGH (ref 1.2–2.2)
Albumin: 4.4 g/dL (ref 3.8–4.9)
Alkaline Phosphatase: 70 IU/L (ref 39–117)
BUN/Creatinine Ratio: 19 (ref 9–23)
BUN: 19 mg/dL (ref 6–24)
Bilirubin Total: 0.4 mg/dL (ref 0.0–1.2)
CO2: 23 mmol/L (ref 20–29)
Calcium: 9.9 mg/dL (ref 8.7–10.2)
Chloride: 104 mmol/L (ref 96–106)
Creatinine, Ser: 0.98 mg/dL (ref 0.57–1.00)
GFR calc Af Amer: 76 mL/min/{1.73_m2} (ref >59)
GFR calc non Af Amer: 66 mL/min/{1.73_m2} (ref >59)
Globulin, Total: 1.9 g/dL (ref 1.5–4.5)
Glucose: 126 mg/dL — ABNORMAL HIGH (ref 65–99)
Potassium: 4.6 mmol/L (ref 3.5–5.2)
Sodium: 141 mmol/L (ref 134–144)
Total Protein: 6.3 g/dL (ref 6.0–8.5)

## 2019-10-24 NOTE — Progress Notes (Signed)
No kidney stones or abnormality seen. Lumbar spine x ray within the normal limits as well.

## 2019-10-24 NOTE — Addendum Note (Signed)
Addended by: Doreen Beam on: 10/24/2019 12:24 PM   Modules accepted: Orders

## 2019-10-24 NOTE — Telephone Encounter (Signed)
Orders Placed This Encounter  Procedures  . Ambulatory referral to Obstetrics / Gynecology    Referral Priority:   Urgent    Referral Type:   Consultation    Referral Reason:   Specialty Services Required    Referred to Provider:   Benjaman Kindler, MD    Requested Specialty:   Obstetrics and Gynecology    Number of Visits Requested:   1

## 2019-10-25 ENCOUNTER — Other Ambulatory Visit: Payer: Self-pay | Admitting: Adult Health

## 2019-10-25 DIAGNOSIS — Z975 Presence of (intrauterine) contraceptive device: Secondary | ICD-10-CM

## 2019-10-25 DIAGNOSIS — R1032 Left lower quadrant pain: Secondary | ICD-10-CM

## 2019-10-25 DIAGNOSIS — N83202 Unspecified ovarian cyst, left side: Secondary | ICD-10-CM

## 2019-10-25 LAB — SPECIMEN STATUS REPORT

## 2019-10-25 LAB — HCG, SERUM, QUALITATIVE: hCG,Beta Subunit,Qual,Serum: NEGATIVE m[IU]/mL (ref <6)

## 2019-10-25 MED ORDER — TRAMADOL HCL 50 MG PO TABS: 50.0000 mg | ORAL_TABLET | Freq: Two times a day (BID) | ORAL | 0 refills | Status: AC | PRN

## 2019-10-25 NOTE — Progress Notes (Signed)
Negative HCG

## 2019-10-26 ENCOUNTER — Telehealth: Payer: Self-pay | Admitting: Adult Health

## 2019-10-26 ENCOUNTER — Encounter: Payer: Self-pay | Admitting: Adult Health

## 2019-10-26 NOTE — Telephone Encounter (Signed)
CT scan ordered by provider was done at [facility], results scanned to media. Oral contrast CT abdomen / pelvis. Showed minimal diverticulosis of colon, no acute changes, IUD present. Adnexa no mass seen. Moderate stool burden.   US showed ovarian cyst vs paraovarian cyst. KUB and lumbar x ray was negative.   Advised of diet for above, and increase fiber, avoid nuts and seeds, popcorn,  metamucil per pack instructions recommended.  May try Miralax per package instructions to help with stool. She denies constipation. Her IUD is out of date. She will keep her appt with Dr. Leafy Ro gynecology on 10/30/2019 for pain likely due to ovarian cyst. She was sent Ultram for pain. She has tramadol for migraines but will not use at this time. HCG serum was negative.  She needs colonoscopy but decl;ines order at this time. Will discuss with Jerrol Banana., MD Later.  Advised patient call the office or your primary care doctor for an appointment if no improvement within 72 hours or if any symptoms change or worsen at any time  Advised ER or urgent Care if after hours or on weekend. Call 911 for emergency symptoms at any time.Patinet verbalized understanding of all instructions given/reviewed and treatment plan and has no further questions or concerns at this time.

## 2019-10-30 ENCOUNTER — Other Ambulatory Visit: Payer: Self-pay | Admitting: Obstetrics and Gynecology

## 2019-10-30 NOTE — H&P (Signed)
Chief Complaint:   Judith Fox is a 54 y.o. [phone] here for female here for Pain (llq pain/follow up u/s with ovarian cyst)  Referring provider: Genice Rouge*  History of Present Illness: Patient presented to her PCP with LLQ pain, left-sided pelvic pain, low back pain present for 28 days and getting worse. She underwent a pelvic ultrasound at [facility] which revealed "a 1.5 x1.5 x 1.4 cm exophytic cystic structure that appears to be arisingfrom the left ovary. This is not clearly separate from the [place] and may represent a periovarian cyst or dominant follicle."  Location:  Low left back, LLQ, left side of pelvis   Quality: Pain, getting consistently worse Severity: Severe Duration: 28 days ago  Timing: Intermittent  Context: Her pain was initially at her lower left back; she felt like it was a kidney stone at first. On day 20 of her pain, her pain radiated to her LLQ; she now is having pain both at her LLQ and her lower left back. She was vacuuming when her pain was onset, she thought she might have twisted something. Modifying factors: Certain motions make it worse. Urination and pushing with bowel movements make her pain worse. She has been taking a stool softener for about 1 week and her stools are softer and it hurts a little less when she strains but still hurts; no blood in stool. Associated signs and symptoms: None  TVUS Today (in our office): Ut wnl   Ut=8.60 x 3.94 x 5.43 cm Endometrium=3.39 mm   iud seen in fundus rov  wnl Lt simple ov cyst= 1.49 cm  Treatments and evaluations:  10/23/2019 visit with PCP: c/o low back pain, LLQ pain, left-sided pelvic pain---> Pelvic US at [facility] - see below Ut: 7.1 x 3.3 x 4.9 cm = volume: 60 mL. No fibroids orother mass visualized.  EndometriumThickness: 3 mm. An IUD is noted.  RO: Not visualized  LO: 2.4 x 1.4 x 1.7 cm = volume: 3 mL. There is a 1.5 x1.5 x 1.4 cm exophytic cystic structure that appears  to be arisingfrom the left ovary. This is not clearly separate from the [place] and may represent a periovarian cyst or dominant follicle.  No abnormal free fluid.  No LMP recorded. (Menstrual status: IUD).   Pertinent hx: -Mirena IUD for pain, but noted she would like to remove entirely at replacement due date fall 2020, we did FSH/E2 to see if premenopausal, patient is NOT menopausal per labs\  -Liletta ordered and replacement planned  -Some hot flashes 10-31-17 -Father died of pancreatic vs GI cancer, pt has personal hx of multiple colonoscopies -Type 2 diabetes, frequent vaginal yeast infections; she has Fluconazole; discussed boric acid in the past   Past Medical History:  has a past medical history of Diabetes mellitus type 2, uncomplicated (CMS-HCC), 123XX123), Hypertension, Myocardial infarction (CMS-HCC) 2014-11-01), and Ovarian cyst.  Past Surgical History:  has a past surgical history that includes Knee surgery (Right, 02/13/2013); Tonsillectomy; and Colonoscopy. Family History: family history includes Breast cancer in her maternal grandmother; Cancer in her father; Diabetes in her maternal grandmother; Headaches in her maternal uncle and mother. Social History:  reports that she has never smoked. She has never used smokeless tobacco. She reports current alcohol use. She reports that she does not use drugs. OB/GYN History:  OB History    Gravida  2   Para  2   Term  2   Preterm      AB  Living  2     SAB      TAB      Ectopic      Molar      Multiple      Live Births           Allergies: is allergic to latex and stadol [butorphanol tartrate]. Medications:  Current Outpatient Medications:  .  aspirin 81 MG EC tablet, Take 81 mg by mouth once daily, Disp: , Rfl:  .  atorvastatin (LIPITOR) 40 MG tablet, Take 40 mg by mouth once daily., Disp: , Rfl:  .  benazepril (LOTENSIN) 40 MG tablet, Take 40 mg by mouth daily., Disp: , Rfl:  .  diphenhydrAMINE  HCl inj syringe, Inject into the vein, Disp: , Rfl:  .  docusate (COLACE) 100 MG capsule, Take 100 mg by mouth 2 (two) times daily, Disp: , Rfl:  .  doxycycline (PERIOSTAT) 20 MG tablet, Take 20 mg by mouth 2 (two) times daily., Disp: , Rfl:  .  fexofenadine (ALLEGRA) 180 MG tablet, Take 180 mg by mouth once daily  , Disp: , Rfl:  .  fluconazole (DIFLUCAN) 150 MG tablet, Take 1 tablet by mouth 2-3 times weekly as needed for recurrent yeast infections, Disp: 30 tablet, Rfl: 4 .  glipiZIDE (GLUCOTROL) 5 MG tablet, Take 10 mg by mouth every morning before breakfast  , Disp: , Rfl:  .  ketorolac (TORADOL) 30 mg/mL (1 mL) injection, Inject 2 mLs (60 mg total) into the muscle as needed for Headache., Disp: 24 mL, Rfl: 0 .  loratadine (CLARITIN) 10 mg tablet, Take by mouth, Disp: , Rfl:  .  metFORMIN (GLUMETZA) 1000 MG (MOD) ER tablet, Take 1,000 mg by mouth 2 (two) times daily, Disp: , Rfl:  .  metoclopramide (REGLAN) 10 MG tablet, Take 10 mg by mouth 4 (four) times daily as needed  , Disp: , Rfl:  .  multivitamin tablet, Take 1 tablet by mouth daily., Disp: , Rfl:  .  naproxen (NAPROSYN) 500 MG tablet, Take 500 mg by mouth 2 (two) times daily with meals., Disp: , Rfl:  .  NITROGLYCERIN ORAL, Take 1 tablet by mouth continuously as needed  , Disp: , Rfl:  .  omeprazole (PRILOSEC) 20 MG DR capsule, TAKE 1 CAPSULE BY MOUTH 2 TIMES DAILY BEFORE A MEAL., Disp: , Rfl:  .  RESTASIS 0.05 % ophthalmic emulsion, , Disp: , Rfl:  .  traMADoL (ULTRAM) 50 mg tablet, Take 1 tablet (50 mg total) by mouth every 6 (six) hours as needed for Pain for up to 40 doses, Disp: 40 tablet, Rfl: 1 .  TRULICITY [id number] mL pen injector, INJECT 0.75 MG INTO THE SKIN ONCE A WEEK., Disp: , Rfl:  .  metoprolol tartrate (LOPRESSOR) 25 MG tablet, Take 25 mg by mouth 2 (two) times daily, Disp: , Rfl:  .  peg-electrolyte (NULYTELY) solution, Take as directed for colonic prep. (Patient not taking: Reported on 07/17/2019  ), Disp: 4000 mL,  Rfl: 0 .  RYBELSUS 7 mg Tab, Take 1 tablet by mouth once daily, Disp: , Rfl:    Exam:   BP (!) 147/97   Pulse 102   Ht 175.3 cm (5\' 9" )   Wt 84.2 kg (185 lb 9.6 oz)   BMI 27.41 kg/m   Constitutional:  General appearance: Well nourished, well developed female in no acute distress.  Neuro/psych:  Normal mood and affect. No gross motor deficits. Neck:  Supple, normal appearance.  Respiratory:  Normal respiratory effort, no use of accessory muscles Skin:  No visible rashes or external lesions Abdomen:  Soft, nontender, without hepatosplenomegaly or masses.  No hernias. Carnett's negative, generalized tenderness throughout, not significantly worse with palpation. Pelvic: deferred Impression:   The primary encounter diagnosis was Left lower quadrant pain. Diagnoses of Cyst of left ovary, Acute left-sided low back pain, unspecified whether sciatica present, and Menopausal symptoms were also pertinent to this visit.  Plan:   1. LLQ Pain with Low Left Back Pain  -I have reviewed patient's ultrasound imaging from today and ultrasound from hospital earlier this month. Discussed today's imaging results with patient. Her imaging is not concerning and normal; she does have a left ovarian cyst measuring 1.5 cm but a simple cyst this size is unremarkable. I believer that her ovarian cyst is likely not the cause of her pain but considering her pain level, its duration and increase in intensity without other findings, and at her request, it is reasonable to proceed with surgical exploration.  It is reasonable to move forward with a diagnostic laparoscopy and left ovarian cystectomy, possible left oophorectomy. We will also remove and replace her IUD, as her current one is expiring this year and she was planning for replacement anyway. As she has no other menopausal sx and is hesitant to change anything else before we investigate her pain, we will replace it.   Discussed with patient the risks and  benefits; she is aware that there is a risk that we will not find anything remarkable. I will go ahead and plan for cystectomy but if I can't find it, will remove ovary. She is very interested in this surgery as she feels that her ovarian cyst may be causing her pain. She would like the surgery ASAP; will schedule today.  -Will order CA125  -Patient's menopausal status is unclear, she has an IUD in place. Will plan for IUD replacement at the time of surgery at her request to avoid any changes, but will also go ahead and plan for replacement, as this was her plan for this year anyway.   -LH, FSH, E2   Patient has a history of MI under anesthesia. She states that years ago when she underwent a partial knee replaceent, she had an MI while they were wheeling her from the OR to the recovery room - she stayed in the hospital for at least a week. She follows cardiology and is on lipitor and 81mg  ASA. She was told to mention this if she ever goes under anesthesia again and that cardiology would need to be consulted. - consult Dr. Ubaldo Glassing, her cardiologist for suggestions about workup ahead of time and suggestions for length of monitoring if asx after surgery. Would he recommend an overnight stay or longer?   Diagnoses and all orders for this visit:  Left lower quadrant pain -     US pelvic transvaginal; Future -     CA 125, Serum (Serial) - [facility]  Cyst of left ovary -     US pelvic transvaginal; Future -     CA 125, Serum (Serial) - [facility]  Acute left-sided low back pain, unspecified whether sciatica present  Menopausal symptoms -     FSH and LH - [facility] -     Estradiol - [facility]   Return for Postop check.  ~~~~~~~~~~~~~~~~~~~~~~~~~~~~~~~~~~~~~~~~~~~~~~~~~~~~~~~~~~~~ This note is partially written by Priscella Mann, in the presence of and acting as the scribe of Dr. Benjaman Kindler, who has reviewed, edited and added  to the note to reflect her best personal medical judgment.  This note  was generated in part with voice recognition software and I apologize for any typographical errors that were not detected and corrected.  Sherrie George, MD

## 2019-10-31 ENCOUNTER — Inpatient Hospital Stay: Admission: RE | Admit: 2019-10-31 | Payer: BLUE CROSS/BLUE SHIELD | Source: Ambulatory Visit

## 2019-11-01 ENCOUNTER — Encounter
Admission: RE | Admit: 2019-11-01 | Discharge: 2019-11-01 | Disposition: A | Discharge status: Home / Self Care | Payer: BLUE CROSS/BLUE SHIELD | Source: Ambulatory Visit | Attending: Obstetrics and Gynecology | Admitting: Obstetrics and Gynecology

## 2019-11-01 ENCOUNTER — Other Ambulatory Visit: Payer: Self-pay

## 2019-11-01 DIAGNOSIS — Z01812 Encounter for preprocedural laboratory examination: Secondary | ICD-10-CM | POA: Diagnosis not present

## 2019-11-01 DIAGNOSIS — Z01818 Encounter for other preprocedural examination: Secondary | ICD-10-CM | POA: Insufficient documentation

## 2019-11-01 DIAGNOSIS — Z20822 Contact with and (suspected) exposure to covid-19: Secondary | ICD-10-CM | POA: Insufficient documentation

## 2019-11-01 HISTORY — DX: Unspecified asthma, uncomplicated: J45.909

## 2019-11-01 HISTORY — DX: Personal history of other diseases of the digestive system: Z87.19

## 2019-11-01 HISTORY — DX: Personal history of urinary calculi: Z87.442

## 2019-11-01 HISTORY — DX: Other complications of anesthesia, initial encounter: T88.59XA

## 2019-11-01 HISTORY — DX: Unspecified osteoarthritis, unspecified site: M19.90

## 2019-11-01 HISTORY — DX: Pneumonia, unspecified organism: J18.9

## 2019-11-01 LAB — TYPE AND SCREEN
ABO/RH(D): A POS
Antibody Screen: NEGATIVE

## 2019-11-01 LAB — SARS CORONAVIRUS 2 (TAT 6-24 HRS): SARS Coronavirus 2: NEGATIVE

## 2019-11-01 NOTE — Pre-Procedure Instructions (Signed)
Called Dr Ronelle Nigh regarding cardiac clearance. Pt was cleared by Dr Ubaldo Glassing yesterday due to pt having MI after coming out of OR on her way to PACU. Pt had partial knee replacment in 2014 at an outpatient surgery center in [place] and this is when MI occurred. Dr Ubaldo Glassing told pt she needs to take her Benazepril the morning of surgery and anesthesia instructs [place] to tell pt not to take  this bp med am of surgery. Dr Ronelle Nigh said since Dr Ubaldo Glassing told pt to take am of surgery then she needs to go ahead and take her Benazepril day of surgery. Called pt back and relayed this info to pt that she is to take her Benzepril day of surgery. Pt verbalized understanding.

## 2019-11-01 NOTE — Pre-Procedure Instructions (Signed)
Progress Notes - documented in this encounter Fath, Aloha Gell, MD - 10/31/2019 4:00 PM EDT Formatting of this note might be different from the original.   Chief Complaint: Chief Complaint  Patient presents with  . Pre-op Exam  Date of Service: 10/31/2019 Date of Birth: 02-Dec-1965 PCP: Dwaine Gale, MD  History of Present Illness: Judith Fox is a 54 y.o.female patient who presents for evaluation and preoperative assessment. She is nodule for gynecologic surgery later this week. She has a history of a myocardial infarction which occurred during knee surgery per her report. She has been treated for diabetes mellitus and hypertension. She is currently on atorvastatin at 40 mg daily, aspirin 81 mg daily, benazepril 40 mg daily, as well as oral agents for diabetes. She has had a history of syncope in the past. No arrhythmia that was felt to possibly explain her syncope was noted on her Holter. Elevated troponin in 2014 was felt to be secondary to elevated blood pressure as she had no significant coronary disease by functional study. She is doing fairly well at present. She has no chest pain. She is compliant with her medications. She has had no further symptoms since that event. She is quite active. EKG showed sinus rhythm with no ischemia. Past Medical and Surgical History  Past Medical History Past Medical History:  Diagnosis Date  . Diabetes mellitus type 2, uncomplicated (CMS-HCC)  . Headache(784.0)  . Hypertension  . Myocardial infarction (CMS-HCC) 2016  . Ovarian cyst   Past Surgical History She has a past surgical history that includes Knee surgery (Right, 02/13/2013); Tonsillectomy; and Colonoscopy.   Medications and Allergies  Current Medications  Current Outpatient Medications  Medication Sig Dispense Refill  . aspirin 81 MG EC tablet Take 81 mg by mouth once daily  . atorvastatin (LIPITOR) 40 MG tablet Take 40 mg by mouth once daily.  . benazepril (LOTENSIN)  40 MG tablet Take 40 mg by mouth daily.  . diphenhydrAMINE HCl inj syringe Inject into the vein  . docusate (COLACE) 100 MG capsule Take 100 mg by mouth 2 (two) times daily  . doxycycline (PERIOSTAT) 20 MG tablet Take 20 mg by mouth 2 (two) times daily.  . fexofenadine (ALLEGRA) 180 MG tablet Take 180 mg by mouth once daily  . fluconazole (DIFLUCAN) 150 MG tablet Take 1 tablet by mouth 2-3 times weekly as needed for recurrent yeast infections 30 tablet 4  . glipiZIDE (GLUCOTROL) 5 MG tablet Take 10 mg by mouth every morning before breakfast  . ketorolac (TORADOL) 30 mg/mL (1 mL) injection Inject 2 mLs (60 mg total) into the muscle as needed for Headache. 24 mL 0  . loratadine (CLARITIN) 10 mg tablet Take by mouth  . metFORMIN (GLUMETZA) 1000 MG (MOD) ER tablet Take 1,000 mg by mouth 2 (two) times daily  . metoclopramide (REGLAN) 10 MG tablet Take 10 mg by mouth 4 (four) times daily as needed  . metoprolol tartrate (LOPRESSOR) 25 MG tablet Take 25 mg by mouth 2 (two) times daily  . multivitamin tablet Take 1 tablet by mouth daily.  . naproxen (NAPROSYN) 500 MG tablet Take 500 mg by mouth 2 (two) times daily with meals.  Marland Kitchen NITROGLYCERIN ORAL Take 1 tablet by mouth continuously as needed  . omeprazole (PRILOSEC) 20 MG DR capsule TAKE 1 CAPSULE BY MOUTH 2 TIMES DAILY BEFORE A MEAL.  . peg-electrolyte (NULYTELY) solution Take as directed for colonic prep. 4000 mL 0  . RESTASIS 0.05 % ophthalmic emulsion  .  RYBELSUS 7 mg Tab Take 1 tablet by mouth once daily  . traMADoL (ULTRAM) 50 mg tablet Take 1 tablet (50 mg total) by mouth every 6 (six) hours as needed for Pain for up to 40 doses 40 tablet 1  . TRULICITY [id number] mL pen injector INJECT 0.75 MG INTO THE SKIN ONCE A WEEK.   No current facility-administered medications for this visit.   Allergies: Latex and Stadol [butorphanol tartrate]  Social and Family History  Social History reports that she has never smoked. She has never used  smokeless tobacco. She reports current alcohol use. She reports that she does not use drugs.  Family History Family History  Problem Relation Age of Onset  . Headaches Mother  . Headaches Maternal Uncle  . Cancer Father  . Diabetes Maternal Grandmother  . Breast cancer Maternal Grandmother   Review of Systems  Review of Systems  Constitutional: Negative for chills, diaphoresis, fever, malaise/fatigue and weight loss.  HENT: Negative for congestion, ear discharge, hearing loss and tinnitus.  Eyes: Negative for blurred vision.  Respiratory: Negative for cough, hemoptysis, sputum production, shortness of breath and wheezing.  Cardiovascular: Negative for chest pain, palpitations, orthopnea, claudication, leg swelling and PND.  Gastrointestinal: Negative for abdominal pain, blood in stool, constipation, diarrhea, heartburn, melena, nausea and vomiting.  Genitourinary: Negative for dysuria, frequency, hematuria and urgency.  Musculoskeletal: Negative for back pain, falls, joint pain and myalgias.  Skin: Negative for itching and rash.  Neurological: Negative for dizziness, tingling, focal weakness, loss of consciousness, weakness and headaches.  Endo/Heme/Allergies: Negative for polydipsia. Does not bruise/bleed easily.  Psychiatric/Behavioral: Negative for depression, memory loss and substance abuse. The patient is not nervous/anxious.   Physical Examination   Vitals:BP 146/80  Pulse 92  Resp 16  Ht 175.3 cm (5\' 9" )  Wt 84.8 kg (187 lb)  SpO2 98%  BMI 27.62 kg/m  Ht:175.3 cm (5\' 9" ) Wt:84.8 kg (187 lb) [id number] surface area is 2.03 meters squared. Body mass index is 27.62 kg/m.  Wt Readings from Last 3 Encounters:  10/31/19 84.8 kg (187 lb)  10/30/19 84.2 kg (185 lb 9.6 oz)  07/17/19 84.5 kg (186 lb 3.2 oz)   BP Readings from Last 3 Encounters:  10/31/19 146/80  10/30/19 (!) 147/97  07/17/19 132/82   General appearance appears in no acute distress  Head Mouth and Eye  exam Normocephalic, without obvious abnormality, atraumatic Dentition is good Eyes appear anicteric   Neck exam Thyroid: normal  Nodes: no obvious adenopathy  LUNGS Breath Sounds: Normal Percussion: Normal  CARDIOVASCULAR JVP CV wave: no HJR: no Elevation at 90 degrees: None Carotid Pulse: normal pulsation bilaterally Bruit: None Apex: apical impulse normal  Auscultation Rhythm: normal sinus rhythm S1: normal S2: normal Clicks: no Rub: no Murmurs: no murmurs  Gallop: None ABDOMEN Liver enlargement: no Pulsatile aorta: no Ascites: no Bruits: no  EXTREMITIES Clubbing: no Edema: trace to 1+ bilateral pedal edema Pulses: peripheral pulses symmetrical Femoral Bruits: no Amputation: no SKIN Rash: no Cyanosis: no Embolic phemonenon: no Bruising: no NEURO Alert and Oriented to person, place and time: yes Non focal: yes  PSYCH: Pt appears to have normal affect  LABS REVIEWED Last 3 CBC results: Lab Results  Component Value Date  WBC 15.0 (H) 02/14/2013  WBC 19.8 (H) 02/13/2013  WBC 15.4 (H) 05/05/2012   Lab Results  Component Value Date  HGB 11.5 (L) 02/14/2013  HGB 12.3 02/13/2013  HGB 14.1 05/05/2012   Lab Results  Component Value Date  HCT 0.35 02/14/2013  HCT 0.37 02/13/2013  HCT 0.41 05/05/2012   Lab Results  Component Value Date  PLT 190 02/14/2013  PLT 253 02/13/2013  PLT 285 05/05/2012   Lab Results  Component Value Date  CREATININE 0.8 02/14/2013  BUN 19.0 02/14/2013  NA 137 02/14/2013  K 4.3 02/14/2013  CL 106 02/14/2013  CO2 27 02/14/2013   Lab Results  Component Value Date  HGBA1C 6.3 (H) 02/13/2013   Lab Results  Component Value Date  HDL 45 02/13/2013   Lab Results  Component Value Date  LDLCALC 73 02/13/2013   Lab Results  Component Value Date  TRIG 114 02/13/2013   Lab Results  Component Value Date  ALT 27 02/13/2013  AST 28 02/13/2013  ALKPHOS 56 02/13/2013   No results found for:  TSH  Diagnostic Studies Reviewed:  EKG EKG demonstrated normal EKG, normal sinus rhythm, normal sinus rhythm, nonspecific ST and T waves changes.  Assessment and Plan   53 y.o. female with  ICD-10-CM ICD-9-CM  1. History of non-ST elevation myocardial infarction-2 likely to demand due to hypertension perioperatively. She is currently stable. Apparently had a transient EKG changes however borderline troponin elevation at the time of surgery felt to be secondary to increased demand with excessive hypertension. Functional study at the time revealed no evidence of ischemia. She is asymptomatic at present tolerating current medications well. Will remain on current regimen treating her blood pressure with a goal of 130/80 or less. She will remain on high intensity statin to maintain an LDL of around 70. He is quite active with no chest pain. She is at low risk for surgery from a cardiac standpoint. Would continue with metoprolol and benazepril. No further cardiac work-up indicated at present as patient is at low risk for this procedure. I25.2 412 ECG 12-lead   2. Hypertension-I10-continue with current regimen with a blood pressure goal of 130/80. DASH diet is recommended  3. Hyperlipidemia-E78.2-continue with statin to keep her LDL around 70. Low-fat low-cholesterol diet is also recommended.  Return if symptoms worsen or fail to improve.  These notes generated with voice recognition software. I apologize for typographical errors.  Sydnee Levans, MD    Electronically signed by Sydnee Levans, MD at 10/31/2019 4:40 PM EDT

## 2019-11-01 NOTE — Pre-Procedure Instructions (Signed)
CARDIAC CLEARANCE IN CARE EVERYWHERE FROM DR FATH-LOW RISK

## 2019-11-01 NOTE — Patient Instructions (Signed)
Your procedure is scheduled on: 11-02-19 FRIDAY Report to Same Day Surgery 2nd floor medical mall [facility] Entrance-take elevator on left to 2nd floor.  Check in with surgery information desk.) To find out your arrival time please call [phone] between 1PM - 3PM on 11-01-19 THURSDAY  Remember: Instructions that are not followed completely may result in serious medical risk, up to and including death, or upon the discretion of your surgeon and anesthesiologist your surgery may need to be rescheduled.    _x___ 1. Do not eat food after midnight the night before your procedure. NO GUM OR CANDY AFTER MIDNIGHT. You may drink WATER up to 2 hours before you are scheduled to arrive at the hospital for your procedure.  Do not drink WATER within 2 hours of your scheduled arrival to the hospital.  Type 1 and type 2 diabetics should only drink water.   ____Ensure clear carbohydrate drink on the way to the hospital for bariatric patients  _X___GATORADE G2-FINISH DRINK 2 HOURS PRIOR TO [place]     __x__ 2. No Alcohol for 24 hours before or after surgery.   __x__3. No Smoking or e-cigarettes for 24 prior to surgery.  Do not use any chewable tobacco products for at least 6 hour prior to surgery   ____  4. Bring all medications with you on the day of surgery if instructed.    __x__ 5. Notify your doctor if there is any change in your medical condition     (cold, fever, infections).    x___6. On the morning of surgery brush your teeth with toothpaste and water.  You may rinse your mouth with mouth wash if you wish.  Do not swallow any toothpaste or mouthwash.   Do not wear jewelry, make-up, hairpins, clips or nail polish.  Do not wear lotions, powders, or perfumes.  Do not shave 48 hours prior to surgery. Men may shave face and neck.  Do not bring valuables to the hospital.    [facility] is not responsible for any belongings or valuables.               Contacts, dentures or  bridgework may not be worn into surgery.  Leave your suitcase in the car. After surgery it may be brought to your room.  For patients admitted to the hospital, discharge time is determined by your treatment team.  _  Patients discharged the day of surgery will not be allowed to drive home.  You will need someone to drive you home and stay with you the night of your procedure.    Please read over the following fact sheets that you were given:   [facility] Preparing for Surgery/INCENTIVE SPIROMETER INSTRUCTIONS  _x___ TAKE THE FOLLOWING MEDICATION THE MORNING OF SURGERY WITH A SMALL SIP OF WATER. These include:  1. ALLEGRA (FEXOFENADINE) OR CLARITIN (LORATADINE)-TAKE WHICHEVER ONE YOU ARE CURRENTLY TAKING  2. PRILOSEC (OMEPRAZOLE)  3.  4.  5.  6.  ____Fleets enema or Magnesium Citrate as directed.   _x___ Use CHG Soap or sage wipes as directed on instruction sheet   ____ Use inhalers on the day of surgery and bring to hospital day of surgery  _X___ Stop Metformin 2 days prior to surgery-STOP NOW-LAST DOSE WAS THIS MORNING (11-01-19)   ____ Take 1/2 of usual insulin dose the night before surgery and none on the morning surgery.   _x___ Follow recommendations from Cardiologist, Pulmonologist or PCP regarding stopping Aspirin, Coumadin, Plavix ,Eliquis,  Effient, or Pradaxa, and Pletal-LAST DOSE OF ASPIRIN WAS TODAY (11-01-19)-DO NOT TAKE AM OF SURGERY  X____Stop Anti-inflammatories such as Advil, Aleve, Ibuprofen, Motrin, Naproxen, Naprosyn, Goodies powders or aspirin products NOW-OK to take Tylenol OR TRAMADOL   ____ Stop supplements until after surgery.     ____ Bring C-Pap to the hospital.

## 2019-11-02 ENCOUNTER — Encounter
Admission: RE | Disposition: A | Discharge status: Home / Self Care | Payer: Self-pay | Source: Home / Self Care | Attending: Obstetrics and Gynecology

## 2019-11-02 ENCOUNTER — Encounter: Payer: Self-pay | Admitting: Obstetrics and Gynecology

## 2019-11-02 ENCOUNTER — Ambulatory Visit: Payer: BLUE CROSS/BLUE SHIELD | Admitting: Anesthesiology

## 2019-11-02 ENCOUNTER — Ambulatory Visit
Admission: RE | Admit: 2019-11-02 | Discharge: 2019-11-02 | Disposition: A | Discharge status: Home / Self Care | Payer: BLUE CROSS/BLUE SHIELD | Attending: Obstetrics and Gynecology | Admitting: Obstetrics and Gynecology

## 2019-11-02 DIAGNOSIS — N8302 Follicular cyst of left ovary: Secondary | ICD-10-CM | POA: Insufficient documentation

## 2019-11-02 DIAGNOSIS — Z791 Long term (current) use of non-steroidal anti-inflammatories (NSAID): Secondary | ICD-10-CM | POA: Diagnosis not present

## 2019-11-02 DIAGNOSIS — R519 Headache, unspecified: Secondary | ICD-10-CM | POA: Diagnosis not present

## 2019-11-02 DIAGNOSIS — Z7984 Long term (current) use of oral hypoglycemic drugs: Secondary | ICD-10-CM | POA: Diagnosis not present

## 2019-11-02 DIAGNOSIS — Z885 Allergy status to narcotic agent status: Secondary | ICD-10-CM | POA: Insufficient documentation

## 2019-11-02 DIAGNOSIS — K449 Diaphragmatic hernia without obstruction or gangrene: Secondary | ICD-10-CM | POA: Diagnosis not present

## 2019-11-02 DIAGNOSIS — E118 Type 2 diabetes mellitus with unspecified complications: Secondary | ICD-10-CM | POA: Insufficient documentation

## 2019-11-02 DIAGNOSIS — Z833 Family history of diabetes mellitus: Secondary | ICD-10-CM | POA: Insufficient documentation

## 2019-11-02 DIAGNOSIS — N838 Other noninflammatory disorders of ovary, fallopian tube and broad ligament: Secondary | ICD-10-CM | POA: Insufficient documentation

## 2019-11-02 DIAGNOSIS — I10 Essential (primary) hypertension: Secondary | ICD-10-CM | POA: Insufficient documentation

## 2019-11-02 DIAGNOSIS — R102 Pelvic and perineal pain: Secondary | ICD-10-CM | POA: Insufficient documentation

## 2019-11-02 DIAGNOSIS — I252 Old myocardial infarction: Secondary | ICD-10-CM | POA: Diagnosis not present

## 2019-11-02 DIAGNOSIS — E119 Type 2 diabetes mellitus without complications: Secondary | ICD-10-CM | POA: Diagnosis not present

## 2019-11-02 DIAGNOSIS — Z809 Family history of malignant neoplasm, unspecified: Secondary | ICD-10-CM | POA: Insufficient documentation

## 2019-11-02 DIAGNOSIS — J4520 Mild intermittent asthma, uncomplicated: Secondary | ICD-10-CM | POA: Insufficient documentation

## 2019-11-02 DIAGNOSIS — Z79899 Other long term (current) drug therapy: Secondary | ICD-10-CM | POA: Diagnosis not present

## 2019-11-02 DIAGNOSIS — Z87442 Personal history of urinary calculi: Secondary | ICD-10-CM | POA: Insufficient documentation

## 2019-11-02 DIAGNOSIS — M1990 Unspecified osteoarthritis, unspecified site: Secondary | ICD-10-CM | POA: Insufficient documentation

## 2019-11-02 DIAGNOSIS — Z7982 Long term (current) use of aspirin: Secondary | ICD-10-CM | POA: Insufficient documentation

## 2019-11-02 DIAGNOSIS — Z803 Family history of malignant neoplasm of breast: Secondary | ICD-10-CM | POA: Diagnosis not present

## 2019-11-02 DIAGNOSIS — Z91040 Latex allergy status: Secondary | ICD-10-CM | POA: Insufficient documentation

## 2019-11-02 DIAGNOSIS — Z96651 Presence of right artificial knee joint: Secondary | ICD-10-CM | POA: Insufficient documentation

## 2019-11-02 DIAGNOSIS — N736 Female pelvic peritoneal adhesions (postinfective): Secondary | ICD-10-CM | POA: Insufficient documentation

## 2019-11-02 DIAGNOSIS — N83202 Unspecified ovarian cyst, left side: Secondary | ICD-10-CM | POA: Diagnosis present

## 2019-11-02 HISTORY — PX: INTRAUTERINE DEVICE (IUD) INSERTION: SHX5877

## 2019-11-02 HISTORY — PX: LAPAROSCOPIC SALPINGO OOPHERECTOMY: SHX5927

## 2019-11-02 HISTORY — PX: LAPAROSCOPY: SHX197

## 2019-11-02 HISTORY — PX: IUD REMOVAL: SHX5392

## 2019-11-02 HISTORY — PX: LAPAROSCOPIC OVARIAN CYSTECTOMY: SHX6248

## 2019-11-02 LAB — POCT PREGNANCY, URINE: Preg Test, Ur: NEGATIVE

## 2019-11-02 LAB — GLUCOSE, CAPILLARY
Glucose-Capillary: 159 mg/dL — ABNORMAL HIGH (ref 70–99)
Glucose-Capillary: 136 mg/dL — ABNORMAL HIGH (ref 70–99)

## 2019-11-02 LAB — ABO/RH: ABO/RH(D): A POS

## 2019-11-02 SURGERY — EXAM UNDER ANESTHESIA
Anesthesia: General | Laterality: Right

## 2019-11-02 MED ORDER — ACETAMINOPHEN 500 MG PO TABS
ORAL_TABLET | ORAL | Status: AC
  Filled 2019-11-02: qty 2

## 2019-11-02 MED ORDER — FENTANYL CITRATE (PF) 100 MCG/2ML IJ SOLN
INTRAMUSCULAR | Status: DC | PRN
  Administered 2019-11-02: 50 ug via INTRAVENOUS
  Administered 2019-11-02 (×2): 25 ug via INTRAVENOUS

## 2019-11-02 MED ORDER — ONDANSETRON HCL 4 MG/2ML IJ SOLN
INTRAMUSCULAR | Status: AC
  Filled 2019-11-02: qty 2

## 2019-11-02 MED ORDER — PROPOFOL 10 MG/ML IV BOLUS
INTRAVENOUS | Status: AC
  Filled 2019-11-02: qty 20

## 2019-11-02 MED ORDER — MIDAZOLAM HCL 2 MG/2ML IJ SOLN
INTRAMUSCULAR | Status: AC
  Filled 2019-11-02: qty 2

## 2019-11-02 MED ORDER — IBUPROFEN 800 MG PO TABS: 800.0000 mg | ORAL_TABLET | Freq: Three times a day (TID) | ORAL | 1 refills | Status: AC

## 2019-11-02 MED ORDER — ROCURONIUM BROMIDE 100 MG/10ML IV SOLN
INTRAVENOUS | Status: DC | PRN
  Administered 2019-11-02: 20 mg via INTRAVENOUS
  Administered 2019-11-02: 10 mg via INTRAVENOUS
  Administered 2019-11-02: 50 mg via INTRAVENOUS

## 2019-11-02 MED ORDER — GABAPENTIN 300 MG PO CAPS
300.0000 mg | ORAL_CAPSULE | ORAL | Status: AC
  Administered 2019-11-02: 09:00:00 300 mg via ORAL

## 2019-11-02 MED ORDER — SUGAMMADEX SODIUM 200 MG/2ML IV SOLN
INTRAVENOUS | Status: DC | PRN
  Administered 2019-11-02: 200 mg via INTRAVENOUS

## 2019-11-02 MED ORDER — GABAPENTIN 800 MG PO TABS: 800.0000 mg | ORAL_TABLET | Freq: Every day | ORAL | 0 refills | Status: DC

## 2019-11-02 MED ORDER — PHENYLEPHRINE HCL (PRESSORS) 10 MG/ML IV SOLN
INTRAVENOUS | Status: DC | PRN
  Administered 2019-11-02 (×3): 100 ug via INTRAVENOUS

## 2019-11-02 MED ORDER — KETOROLAC TROMETHAMINE 30 MG/ML IJ SOLN
INTRAMUSCULAR | Status: AC
  Filled 2019-11-02: qty 1

## 2019-11-02 MED ORDER — LIDOCAINE HCL (PF) 2 % IJ SOLN
INTRAMUSCULAR | Status: AC
  Filled 2019-11-02: qty 5

## 2019-11-02 MED ORDER — KETOROLAC TROMETHAMINE 30 MG/ML IJ SOLN
INTRAMUSCULAR | Status: DC | PRN
  Administered 2019-11-02: 30 mg via INTRAVENOUS

## 2019-11-02 MED ORDER — SODIUM CHLORIDE 0.9 % IV SOLN: INTRAVENOUS | Status: DC

## 2019-11-02 MED ORDER — MIDAZOLAM HCL 2 MG/2ML IJ SOLN
INTRAMUSCULAR | Status: DC | PRN
  Administered 2019-11-02: 2 mg via INTRAVENOUS

## 2019-11-02 MED ORDER — DOCUSATE SODIUM 100 MG PO CAPS: 100.0000 mg | ORAL_CAPSULE | Freq: Two times a day (BID) | ORAL | 0 refills | Status: DC

## 2019-11-02 MED ORDER — ONDANSETRON HCL 4 MG/2ML IJ SOLN
INTRAMUSCULAR | Status: DC | PRN
  Administered 2019-11-02: 4 mg via INTRAVENOUS

## 2019-11-02 MED ORDER — LACTATED RINGERS IV SOLN: INTRAVENOUS | Status: DC

## 2019-11-02 MED ORDER — KETOROLAC TROMETHAMINE 15 MG/ML IJ SOLN
INTRAMUSCULAR | Status: AC
  Administered 2019-11-02: 09:00:00 15 mg via INTRAVENOUS
  Filled 2019-11-02: qty 1

## 2019-11-02 MED ORDER — BUPIVACAINE HCL 0.5 % IJ SOLN
INTRAMUSCULAR | Status: DC | PRN
  Administered 2019-11-02: 8 mL

## 2019-11-02 MED ORDER — ACETAMINOPHEN 500 MG PO TABS: 1000.0000 mg | ORAL_TABLET | Freq: Four times a day (QID) | ORAL | 0 refills | Status: AC

## 2019-11-02 MED ORDER — OXYCODONE HCL 5 MG/5ML PO SOLN: 5.0000 mg | Freq: Once | ORAL | Status: AC | PRN

## 2019-11-02 MED ORDER — ROCURONIUM BROMIDE 10 MG/ML (PF) SYRINGE
PREFILLED_SYRINGE | INTRAVENOUS | Status: AC
  Filled 2019-11-02: qty 10

## 2019-11-02 MED ORDER — ACETAMINOPHEN 500 MG PO TABS
1000.0000 mg | ORAL_TABLET | ORAL | Status: AC
  Administered 2019-11-02: 09:00:00 1000 mg via ORAL

## 2019-11-02 MED ORDER — PHENYLEPHRINE HCL-NACL 10-0.9 MG/250ML-% IV SOLN
INTRAVENOUS | Status: DC | PRN
  Administered 2019-11-02: 20 ug/min via INTRAVENOUS

## 2019-11-02 MED ORDER — KETOROLAC TROMETHAMINE 15 MG/ML IJ SOLN: 15.0000 mg | INTRAMUSCULAR | Status: AC

## 2019-11-02 MED ORDER — GABAPENTIN 300 MG PO CAPS
ORAL_CAPSULE | ORAL | Status: AC
  Filled 2019-11-02: qty 1

## 2019-11-02 MED ORDER — DEXAMETHASONE SODIUM PHOSPHATE 10 MG/ML IJ SOLN
INTRAMUSCULAR | Status: DC | PRN
  Administered 2019-11-02: 5 mg via INTRAVENOUS

## 2019-11-02 MED ORDER — LIDOCAINE HCL (CARDIAC) PF 100 MG/5ML IV SOSY
PREFILLED_SYRINGE | INTRAVENOUS | Status: DC | PRN
  Administered 2019-11-02: 60 mg via INTRAVENOUS

## 2019-11-02 MED ORDER — FENTANYL CITRATE (PF) 100 MCG/2ML IJ SOLN
INTRAMUSCULAR | Status: AC
  Filled 2019-11-02: qty 2

## 2019-11-02 MED ORDER — OXYCODONE HCL 5 MG PO TABS: 5.0000 mg | ORAL_TABLET | ORAL | 0 refills | Status: DC | PRN

## 2019-11-02 MED ORDER — PROPOFOL 10 MG/ML IV BOLUS
INTRAVENOUS | Status: DC | PRN
  Administered 2019-11-02: 160 mg via INTRAVENOUS
  Administered 2019-11-02: 40 mg via INTRAVENOUS

## 2019-11-02 MED ORDER — BUPIVACAINE HCL (PF) 0.5 % IJ SOLN
INTRAMUSCULAR | Status: AC
  Filled 2019-11-02: qty 30

## 2019-11-02 MED ORDER — OXYCODONE HCL 5 MG PO TABS
ORAL_TABLET | ORAL | Status: AC
  Filled 2019-11-02: qty 1

## 2019-11-02 MED ORDER — OXYCODONE HCL 5 MG PO TABS
5.0000 mg | ORAL_TABLET | Freq: Once | ORAL | Status: AC | PRN
  Administered 2019-11-02: 13:00:00 5 mg via ORAL

## 2019-11-02 MED ORDER — FENTANYL CITRATE (PF) 100 MCG/2ML IJ SOLN: 25.0000 ug | INTRAMUSCULAR | Status: DC | PRN

## 2019-11-02 SURGICAL SUPPLY — 52 items
BAG URINE DRAIN 2000ML AR STRL (UROLOGICAL SUPPLIES) ×4 IMPLANT
BLADE SURG SZ11 CARB STEEL (BLADE) ×4 IMPLANT
CATH FOLEY 2WAY  5CC 16FR (CATHETERS) ×1
CATH ROBINSON RED A/P 16FR (CATHETERS) IMPLANT
CATH URTH 16FR FL 2W BLN LF (CATHETERS) ×3 IMPLANT
CHLORAPREP W/TINT 26 (MISCELLANEOUS) ×4 IMPLANT
COVER WAND RF STERILE (DRAPES) ×4 IMPLANT
CUP MEDICINE 2OZ PLAST GRAD ST (MISCELLANEOUS) IMPLANT
DERMABOND ADVANCED (GAUZE/BANDAGES/DRESSINGS) ×1
DERMABOND ADVANCED .7 DNX12 (GAUZE/BANDAGES/DRESSINGS) ×3 IMPLANT
DRAPE GENERAL ENDO 106X123.5 (DRAPES) ×4 IMPLANT
DRAPE STERI POUCH LG 24X46 STR (DRAPES) IMPLANT
DRAPE UNDER BUTTOCK W/FLU (DRAPES) ×4 IMPLANT
GAUZE 4X4 16PLY RFD (DISPOSABLE) ×4 IMPLANT
GLOVE BIO SURGEON STRL SZ7 (GLOVE) ×8 IMPLANT
GLOVE INDICATOR 7.5 STRL GRN (GLOVE) ×4 IMPLANT
GOWN STRL REUS W/ TWL LRG LVL3 (GOWN DISPOSABLE) ×6 IMPLANT
GOWN STRL REUS W/TWL LRG LVL3 (GOWN DISPOSABLE) ×2
IRRIGATION STRYKERFLOW (MISCELLANEOUS) ×3 IMPLANT
IRRIGATOR STRYKERFLOW (MISCELLANEOUS) ×4
IV NS 1000ML (IV SOLUTION) ×1
IV NS 1000ML BAXH (IV SOLUTION) ×3 IMPLANT
KIT PINK PAD W/HEAD ARE REST (MISCELLANEOUS) ×4
KIT PINK PAD W/HEAD ARM REST (MISCELLANEOUS) ×3 IMPLANT
KIT TURNOVER CYSTO (KITS) ×4 IMPLANT
LABEL OR SOLS (LABEL) ×4 IMPLANT
LIGASURE VESSEL 5MM BLUNT TIP (ELECTROSURGICAL) IMPLANT
Liletta (levonorgestrel-releasing intrauterine sys ×4 IMPLANT
NEEDLE FILTER BLUNT 18X 1/2SAF (NEEDLE) ×1
NEEDLE FILTER BLUNT 18X1 1/2 (NEEDLE) ×3 IMPLANT
NS IRRIG 500ML POUR BTL (IV SOLUTION) ×4 IMPLANT
PACK DNC HYST (MISCELLANEOUS) IMPLANT
PACK GYN LAPAROSCOPIC (MISCELLANEOUS) ×4 IMPLANT
PAD OB MATERNITY 4.3X12.25 (PERSONAL CARE ITEMS) ×4 IMPLANT
PAD PREP 24X41 OB/GYN DISP (PERSONAL CARE ITEMS) ×4 IMPLANT
POUCH SPECIMEN RETRIEVAL 10MM (ENDOMECHANICALS) IMPLANT
SCISSORS METZENBAUM CVD 33 (INSTRUMENTS) IMPLANT
SET TUBE SMOKE EVAC HIGH FLOW (TUBING) ×4 IMPLANT
SLEEVE ENDOPATH XCEL 5M (ENDOMECHANICALS) ×4 IMPLANT
SOL PREP PVP 2OZ (MISCELLANEOUS) ×4
SOLUTION PREP PVP 2OZ (MISCELLANEOUS) ×3 IMPLANT
STRIP CLOSURE SKIN 1/4X4 (GAUZE/BANDAGES/DRESSINGS) ×4 IMPLANT
SURGILUBE 2OZ TUBE FLIPTOP (MISCELLANEOUS) ×4 IMPLANT
SUT MNCRL AB 4-0 PS2 18 (SUTURE) ×4 IMPLANT
SUT VIC AB 2-0 UR6 27 (SUTURE) ×4 IMPLANT
SUT VIC AB 4-0 SH 27 (SUTURE) ×1
SUT VIC AB 4-0 SH 27XANBCTRL (SUTURE) ×3 IMPLANT
SYR 50ML LL SCALE MARK (SYRINGE) IMPLANT
SYR 5ML LL (SYRINGE) ×4 IMPLANT
TOWEL OR 17X26 4PK STRL BLUE (TOWEL DISPOSABLE) IMPLANT
TROCAR XCEL NON-BLD 5MMX100MML (ENDOMECHANICALS) ×4 IMPLANT
TUBING ART PRESS 48 MALE/FEM (TUBING) IMPLANT

## 2019-11-02 NOTE — Op Note (Addendum)
Judith Fox PROCEDURE DATE: 11/02/2019  PREOPERATIVE DIAGNOSIS: Acute pelvic pain, left ovarian cyst. Expired IUD POSTOPERATIVE DIAGNOSIS: same, with right paratubal cyst, left pelvic adhesions  PROCEDURE: Operative laparoscopy, left salpingo-oophorectomy, right paratubal cyst excision, IUD removal, IUD replacement, pelvic washings  SURGEON:  Dr. Benjaman Kindler ASSISTANT: Dr. Vikki Ports Ward  ANESTHESIOLOGIST: No responsible provider has been recorded for the case. Anesthesiologist: Piscitello, Precious Haws, MD CRNA: Letitia Neri, CRNA  INDICATIONS: 54 y.o.  F with history of 1 month of left sided pelvic pain with an ovarian cyst desiring surgical evaluation.  She also is due for IUD replacement which she has been using for migraine control, menstrual control and contraception. She requested replacement under anesthesia. Please see preoperative notes for further details.  Risks of surgery were discussed with the patient including but not limited to: bleeding which may require transfusion or reoperation; infection which may require antibiotics; injury to bowel, bladder, ureters or other surrounding organs; need for additional procedures including laparotomy; thromboembolic phenomenon, incisional problems and other postoperative/anesthesia complications. Written informed consent was obtained.    FINDINGS:  Small uterus, normal fallopian tubes bilaterally.  Normal appendix, partially retrocecal. Peritoneal washings were taken and sent to pathology. No other abdominal/pelvic abnormality.  Normal upper abdomen.  Left ovary with multiple small bumpy areas that may be old polycystic ovarian syndrome.  Small right paratubal cyst.  Small left paratubal cyst.  ANESTHESIA:    General INTRAVENOUS FLUIDS: 500 ml ESTIMATED BLOOD LOSS: 10 ml URINE OUTPUT: 200 ml SPECIMENS: left ovary and tube, pelvic washings COMPLICATIONS: None immediate  PROCEDURE IN DETAIL:  The patient had sequential compression  devices applied to her lower extremities while in the preoperative area.  She was then taken to the operating room where general anesthesia was administered and was found to be adequate.  She was placed in the dorsal lithotomy position, and was prepped and draped in a sterile manner.  A Foley catheter was inserted into her bladder and attached to constant drainage.  Her IUD strings were noted at the cervical os, and grasped with a ring forceps and easily removed without complication. A uterine manipulator was then advanced into the uterus.   After an adequate timeout was performed, attention was turned to the abdomen where an umbilical incision was made with the scalpel.  The Optiview 5-mm trocar and sleeve were then advanced without difficulty with the laparoscope under direct visualization into the abdomen.  The abdomen was then insufflated with carbon dioxide gas and adequate pneumoperitoneum was obtained.   A detailed survey of the patient's pelvis and abdomen revealed the findings as mentioned above.  Two additional 97mm trocars were placed in the bilateral lower quadrants under direct visualization   Evaluation of the pelvic sidewalls to observe the course of the ureters was undertaken, assuring the ureter was outside the operative field.   Pelvic washings were taken.   Left oopherectomy The left adnexa was elevated away from the pelvic sidewall. Using a Ligasure electrocautery device, the IP ligament was skeletonized, triply clamped, cauterized and transected to remove the ovary and fallopian tube in its entirety, being careful not to spill the contents of the ovarian cyst.  The left adnexa and the right paratubal cyst were placed into a 10 mm bag that had been inserted in the posterior vaginal cul-de-sac, and removed through the vagina.  This incision was closed with 2 figure-of-eight sutures.  Left pelvic sigmoid adhesions were noted on the left pelvic sidewall, and these were carefully taken down  using blunt cautery and sharp cautery  The operative site was surveyed, and it was found to be hemostatic.  No intraoperative injury to surrounding organs was noted.   Pictures were taken of the quadrants and pelvis. The abdomen was desufflated and all instruments were then removed from the patient's abdomen. The uterine manipulator was removed without complications.  All incisions were closed with 4-0 Vicryl and Dermabond.   Attention was returned to the vagina, where the cervix was reprepped with Betadine.  The new Liletta IUD was placed without complication in the standard fashion.  The patient tolerated the procedures well.  All instruments, needles, and sponge counts were correct x 2. The patient was taken to the recovery room in stable condition.

## 2019-11-02 NOTE — Pre-Procedure Instructions (Signed)
Discharge Summaries - documented in this encounter Laurin Coder, MD - 02/15/2013 3:44 PM EDT Formatting of this note might be different from the original. Discharge Summary   Admit Date: 02/13/2013 Discharge Date: 02/15/2013 2:56 PM Admitting Physician: Hospitalist Admitting/Reassign-Drh  Discharge Physician: Brigitte Pulse Primary Care Provider: Estrella Deeds, MD  Admission Diagnoses:  Chest pain [786.50]  Discharge Diagnoses:  Principal Problem (Resolved): Chest pain, rule out acute myocardial infarction Active Problems: Migraine without aura Status post right partial knee replacement  Consult Orders: IP CONSULT TO GEN MED ADMISSIONS IP CONSULT TO CARDIOLOGY   Procedures Performed:  02/14/13 NM myocardial perfusion SPECT multiple (stress and rest) IMPRESSION:  1. No evidence of pharmacologically induced myocardial ischemia. The left  ventricular ejection fraction is 54%. Small region of fixed decrease  activity involving the anterior and anteroseptal segments is nonspecific  and could be related to a nontransmural MI or breast attenuation artifact.  Surgeries Performed: None  Brief History of Present Illness: Judith Fox is a 54 y.o. female with PMH of DM2 and migraine HA, who R underwent partial knee replacement today at [facility] and while in post-op around 10am her SBP was 240/97and EKG revealed ST depressions in V3-V6 so patient was given labetalol and nitro x1, then patient c/o chest pressure and received ASA 81mg , nitro x2 and morphine 1mg  x1 and was transfered to [facility] for cardiac w/u. Patient states she does not quite remember the episode since it was after the procedure, but did experience some chest pressure when she woke up from procedure, can't tell how long it lasted, did not radiate to back or UE. Also reported feeling nauseated, but no episodes of vomiting. Chest pressure was not accompanied by SOB, diaphoresis or lightheadedness. Denied recent cough, F/C.    In the ED, EKG was unremarkable but CBM revealed Troponin I of 0.79. Was given dilaudid total of 4mg  and zofran 4mg  x1.   Hospital Course: #Chest pain r/o MI: initial suspicion of NSTEMI (elevated Troponin x3, normal ECG) so she was started on asa 81 mg qd, metoprolol 6.25mg  bid and atorvastatin 80mg  qhs. Dose of benazepril 40mg  qdwas changed to captopril 6.25mg  q8hs for better BP control. Team discussed case with cardiology who rec'ed stress test. NM stress test revealed no ischemia. Patient most likely had demand ischemia during post-op period d/t HTN. Patient remained on telemetry and there were no acute events during entire hospitalization. We discontinued metoprolol and discharged on ASA, atorvastatin and home dose of benazepril.  #S/p R partial knee replacement: she reported severe pain that was managed with scheduled doses of tylenol, meloxicam and oxycontin plus prn doses of oxycodone. Pain was controlled with thsi regimen. She also worked with PT/OT, who rec'ed outpatient PT (has f/u at [facility]) and provided patient with BSC and RW. Per ortho recs patient was given antibiotics (intially IV cefazolin 2g q6hs, later switched to PO cephalexin 500mg  bid) for a total of 3 days after procedure. Also ortho rec'ed DVT ppx with ASA 325mg  qd. #DM2: patient did not want ISS, we held oral hypoglycemics for 24 hs. BG above 200, so glimepiride and metformin restarted on day of discharge. Patient advised to stop glipizide.  Discharge Exam:  Filed Vitals:  02/15/13 1300  BP: 142/86  Pulse: 67  Temp: 36.8 C (98.2 F)  Resp: 18   Physical Exam  Constitutional: She is oriented to person, place, and time. She appears well-developed. No distress.  HENT:  Head: Normocephalic and atraumatic.  Mouth/Throat: Oropharynx  is clear and moist.  Eyes: Conjunctivae and EOM are normal. Pupils are equal, round, and reactive to light.  Neck: Normal range of motion. Neck supple. No JVD present.   Cardiovascular: Normal rate, regular rhythm, normal heart sounds and intact distal pulses. Exam reveals no gallop and no friction rub.  No murmur heard. Pulmonary/Chest: Effort normal and breath sounds normal.  Abdominal: Soft. Bowel sounds are normal. She exhibits no distension. There is no tenderness.  Musculoskeletal: Normal range of motion.  Brace over R knee.  Neurological: She is alert and oriented to person, place, and time.  Skin: Skin is warm.   Discharge Disposition:  Home  Allergies: Allergies  Allergen Reactions  . Latex Swelling  . Stadol (Butorphanol Tartrate) Other (See Comments)   Patient Instructions:  Discharge Medication List as of 02/15/2013 11:07 AM   START taking these medications  Details  acetaminophen (TYLENOL) 325 MG tablet Take 2 tablets (650 mg total) by mouth every 6 (six) hours., Starting 02/15/2013, Last dose on Sat 03/17/13, No Print   aspirin 325 MG EC tablet Take 1 tablet (325 mg total) by mouth 2 (two) times daily., Starting 02/15/2013, Last dose on Fri 02/15/14, No Print   atorvastatin (LIPITOR) 80 MG tablet Take 1 tablet (80 mg total) by mouth nightly., Starting 02/15/2013, Last dose on Sat 03/17/13, Print   cephalexin (KEFLEX) 500 MG capsule Take 1 capsule (500 mg total) by mouth 2 (two) times daily., Starting 02/15/2013, Last dose on Fri 02/16/13, Print   meloxicam (MOBIC) 7.5 MG tablet Take 1 tablet (7.5 mg total) by mouth 2 (two) times daily., Starting 02/15/2013, Last dose on Sat 03/17/13, Print   oxyCODONE (OXYCONTIN) 10 MG CR tablet Take 1 tablet (10 mg total) by mouth every 12 (twelve) hours., Starting 02/15/2013, Last dose on Sat 03/17/13, Print    CONTINUE these medications which have CHANGED  Details  oxyCODONE (ROXICODONE) 5 MG immediate release tablet Take 1-3 tablets (5-15 mg total) by mouth every 4 (four) hours as needed for Pain (on e tab pain 2-5, two tab 6-9, three tab pain level 10)., Starting 02/15/2013, Until Sat 03/17/13, Print     CONTINUE these medications which have NOT CHANGED  Details  benazepril (LOTENSIN) 40 MG tablet Take 40 mg by mouth daily., Until Discontinued, Historical Med   clobetasol (TEMOVATE) 0.05 % topical gel Apply topically 2 (two) times daily., Until Discontinued, Historical Med   dihydroergotamine (DHE 45) 1 mg/mL injection Take 1 Ml at onset of headache. May repeat in 2 hours for max of 3 ml in 24 hours., Print   glimepiride (AMARYL) 2 MG tablet Starting 11/24/2011, Until Discontinued, Historical Med   metFORMIN (GLUCOPHATE-XR) 500 MG XR tablet Take 1 tablet by mouth 2 (two) times daily with meals., Starting 12/29/2010, Until Discontinued, Historical Med   multivitamin tablet Take 1 tablet by mouth daily., Until Discontinued, Historical Med   promethazine (PHENERGAN) 25 mg/mL injection Take one at onset of headache. May repeat every 6 hours as needed., Electronic    STOP taking these medications   glipiZIDE (GLUCOTROL) 5 MG tablet   oxyCODONE-acetaminophen (PERCOCET) 5-325 mg tablet     Code Status:  Full code  Activity:  Activity as tolerated, will continue PT as outpatient  Pertinent Recent Labs:  Lab Results  Component Value Date  WBC 15.0* 02/14/2013  HGB 11.5* 02/14/2013  HCT 0.35 02/14/2013  PLT 190 02/14/2013   Lab Results  Component Value Date  NA 137 02/14/2013  K 4.3 02/14/2013  CL 106 02/14/2013  CO2 27 02/14/2013  BUN 19.0 02/14/2013  CREATININE 0.8 02/14/2013  GLUCOSE 202* 02/14/2013   Lab Results  Component Value Date  CK 252* 02/14/2013  TROPONINT <0.010 03/15/2012  TROPONINI 0.95* 02/13/2013   Discharge Condition: Returned to previous baseline  Results Pending at Discharge:  Unresulted Labs  None    Follow-up Tests Recommended:   Notify provider of weight gain greater than 2 lbs in 1 day or 5 lbs in 1 week   Report Symptoms to  Order Comments: Estrella Deeds, MD [phone]   For a life-threatening emergency, call 911   If you smoke (or have smoked within  the last year), we strongly recommend that you do not smoke.   Normal activity   Diabetic diet   Future Appointments Date Time Provider [place]  02/22/2013 3:40 PM Raynelle Highland, MD [facility]   February 20, 2013 at 09:15AM Dr. Dolores Frame [address], [place], [place], [place] [address]  Time Spent:  74 minutes  Doran Durand Melina Fiddler, MD 02/17/2013  I personally saw and evaluated the patient, and participated in the management and treatment plan as documented in the resident/fellow note.  Laurin Coder, MD    Electronically signed by Laurin Coder, MD at 02/18/2013 6:20 AM EDT   Discharge Instructions - documented in this encounter Table of Contents for Discharge Instructions

## 2019-11-02 NOTE — OR Nursing (Signed)
Mirena IUD removed by Dr. Leafy Ro

## 2019-11-02 NOTE — Anesthesia Procedure Notes (Signed)
Procedure Name: Intubation Date/Time: 11/02/2019 10:11 AM Performed by: Letitia Neri, CRNA Pre-anesthesia Checklist: Patient identified, Patient being monitored, Timeout performed, Emergency Drugs available and Suction available Patient Re-evaluated:Patient Re-evaluated prior to induction Oxygen Delivery Method: Circle system utilized Preoxygenation: Pre-oxygenation with 100% oxygen Induction Type: IV induction Ventilation: Mask ventilation without difficulty Laryngoscope Size: Mac and 3 Grade View: Grade I Tube type: Oral Tube size: 7.0 mm Number of attempts: 1 Placement Confirmation: ETT inserted through vocal cords under direct vision,  positive ETCO2 and breath sounds checked- equal and bilateral Secured at: 21 cm Tube secured with: Tape Dental Injury: Teeth and Oropharynx as per pre-operative assessment

## 2019-11-02 NOTE — Interval H&P Note (Signed)
History and Physical Interval Note:  11/02/2019 9:34 AM  Judith Fox  has presented today for surgery, with the diagnosis of acute left pelvic pain, ovarian cyst.  The various methods of treatment have been discussed with the patient and family. After consideration of risks, benefits and other options for treatment, the patient has consented to  Procedure(s): EXAM UNDER ANESTHESIA (N/A) INTRAUTERINE DEVICE (IUD) REMOVAL- MIRENA (N/A) INTRAUTERINE DEVICE (IUD) INSERTION - LILETTA (N/A) LAPAROSCOPY DIAGNOSTIC (N/A) LAPAROSCOPIC OOPHORECTOMY (Left) LAPAROSCOPIC OVARIAN CYSTECTOMY (Left) as a surgical intervention.  The patient's history has been reviewed, patient examined, no change in status, stable for surgery.  I have reviewed the patient's chart and labs.  Questions were answered to the patient's satisfaction.    She has a hx of MI under anesthesia in a previous surgery and has seen cardiology yesterday, who thinks she is low risk for cardiac complications and doesn't anticipate an overnight monitoring stay. We will be under anesthesia as briefly as possible, and careful.   Benjaman Kindler

## 2019-11-02 NOTE — Anesthesia Preprocedure Evaluation (Signed)
Anesthesia Evaluation  Patient identified by MRN, date of birth, ID band Patient awake    Reviewed: Allergy & Precautions, H&P , NPO status , Patient's Chart, lab work & pertinent test results  History of Anesthesia Complications (+) history of anesthetic complications  Airway Mallampati: II  TM Distance: >3 FB Neck ROM: full    Dental  (+) Chipped   Pulmonary neg shortness of breath, asthma , pneumonia,           Cardiovascular Exercise Tolerance: Good hypertension, (-) angina+ CAD and + Past MI  (-) DOE      Neuro/Psych  Headaches, PSYCHIATRIC DISORDERS    GI/Hepatic Neg liver ROS, hiatal hernia, Medicated and Controlled,  Endo/Other  diabetes, Type 2  Renal/GU      Musculoskeletal  (+) Arthritis ,   Abdominal   Peds  Hematology negative hematology ROS (+)   Anesthesia Other Findings Past Medical History: No date: Allergy No date: Arthritis     Comment:  KNEE No date: Asthma     Comment:  "mild" per pt No date: Complication of anesthesia     Comment:  PT HAD A MI AFTER KNEE SURGERY IN 2014 FROM HIGH BLOOD               PRESSURE-SURGERY WAS DONE AT AN [facility] No date: Diabetes mellitus without complication ([place]) No date: History of hiatal hernia No date: History of kidney stones No date: Hypertension No date: Migraine 2014: Myocardial infarction ([place])     Comment:  no stents No date: Pneumonia     Comment:  h/o  Past Surgical History: No date: ANAL FISSURE REPAIR No date: JOINT REPLACEMENT; Right     Comment:  knee replacement No date: KNEE ARTHROSCOPY No date: TONSILLECTOMY No date: WISDOM TOOTH EXTRACTION     Reproductive/Obstetrics negative OB ROS                             Anesthesia Physical Anesthesia Plan  ASA: III  Anesthesia Plan: General ETT   Post-op Pain Management:    Induction: Intravenous  PONV Risk Score and Plan:  Ondansetron, Dexamethasone, Midazolam and Treatment may vary due to age or medical condition  Airway Management Planned: Oral ETT  Additional Equipment:   Intra-op Plan:   Post-operative Plan: Extubation in OR  Informed Consent: I have reviewed the patients History and Physical, chart, labs and discussed the procedure including the risks, benefits and alternatives for the proposed anesthesia with the patient or authorized representative who has indicated his/her understanding and acceptance.     Dental Advisory Given  Plan Discussed with: Anesthesiologist, CRNA and Surgeon  Anesthesia Plan Comments: (Patient consented for risks of anesthesia including but not limited to:  - adverse reactions to medications - damage to teeth, lips or other oral mucosa - sore throat or hoarseness - Damage to heart, brain, lungs or loss of life  Patient voiced understanding.)        Anesthesia Quick Evaluation

## 2019-11-02 NOTE — Anesthesia Postprocedure Evaluation (Signed)
Anesthesia Post Note  Patient: TONA SKUPIEN  Procedure(s) Performed: EXAM UNDER ANESTHESIA (N/A ) INTRAUTERINE DEVICE (IUD) REMOVAL- MIRENA (N/A ) INTRAUTERINE DEVICE (IUD) INSERTION - LILETTA (N/A ) LAPAROSCOPY DIAGNOSTIC WITH PELVIC WASHINGS (N/A ) LAPAROSCOPIC OOPHO (Left ) LAPAROSCOPIC PERITUBALCYSTECTOMY (Right )  Patient location during evaluation: PACU Anesthesia Type: General Level of consciousness: awake and alert Pain management: pain level controlled Vital Signs Assessment: post-procedure vital signs reviewed and stable Respiratory status: spontaneous breathing, nonlabored ventilation, respiratory function stable and patient connected to nasal cannula oxygen Cardiovascular status: blood pressure returned to baseline and stable Postop Assessment: no apparent nausea or vomiting Anesthetic complications: no     Last Vitals:  Vitals:   11/02/19 1229 11/02/19 1244  BP: 126/78 (!) 146/83  Pulse: 76 70  Resp: 18 18  Temp:  (!) 36 C  SpO2: 98% 100%    Last Pain:  Vitals:   11/02/19 1244  TempSrc: Temporal  PainSc: 4                  Broadus John K Aleina Burgio

## 2019-11-02 NOTE — Discharge Instructions (Addendum)
Laparoscopic Ovarian Surgery Discharge Instructions  For the next three days, take ibuprofen and acetaminophen on a schedule, every 8 hours. You can take them together or you can intersperse them, and take one every four hours. I also gave you gabapentin for nighttime, to help you sleep and also to control pain. Take gabapentin medicines at night for at least the next 3 nights. You also have a narcotic, oxycodone, to take as needed if the above medicines don't help.  Postop constipation is a major cause of pain. Stay well hydrated, walk as you tolerate, and take over the counter senna as well as stool softeners if you need them.   RISKS AND COMPLICATIONS   Infection.  Bleeding.  Injury to surrounding organs.  Anesthetic side effects.   PROCEDURE   You may be given a medicine to help you relax (sedative) before the procedure. You will be given a medicine to make you sleep (general anesthetic) during the procedure.  A tube will be put down your throat to help your breath while under general anesthesia.  Several small cuts (incisions) are made in the lower abdominal area and one incision is made near the belly button.  Your abdominal area will be inflated with a safe gas (carbon dioxide). This helps give the surgeon room to operate, visualize, and helps the surgeon avoid other organs.  A thin, lighted tube (laparoscope) with a camera attached is inserted into your abdomen through the incision near the belly button. Other small instruments may also be inserted through other abdominal incisions.  The ovary is located and are removed.  After the ovary is removed, the gas is released from the abdomen.  The incisions will be closed with stitches (sutures), and Dermabond. A bandage may be placed over the incisions.  AFTER THE PROCEDURE   You will also have some mild abdominal discomfort for 3-7 days. You will be given pain medicine to ease any discomfort.  As long as there are no  problems, you may be allowed to go home. Someone will need to drive you home and be with you for at least 24 hours once home.  You may have some mild discomfort in the throat. This is from the tube placed in your throat while you were sleeping.  You may experience discomfort in the shoulder area from some trapped air between the liver and diaphragm. This sensation is normal and will slowly go away on its own.  HOME CARE INSTRUCTIONS   Take all medicines as directed.  Only take over-the-counter or prescription medicines for pain, discomfort, or fever as directed by your caregiver.  Resume daily activities as directed.  Showers are preferred over baths for 2 weeks.  You may resume sexual activities in 1 week or as you feel you would like to.  Do not drive while taking narcotics.  SEEK MEDICAL CARE IF: .  There is increasing abdominal pain.  You feel lightheaded or faint.  You have the chills.  You have an oral temperature above 102 F (38.9 C).  There is pus-like (purulent) drainage from any of the wounds.  You are unable to pass gas or have a bowel movement.  You feel sick to your stomach (nauseous) or throw up (vomit) and can't control it with your medicines.  MAKE SURE YOU:   Understand these instructions.  Will watch your condition.  Will get help right away if you are not doing well or get worse.  Exit[employer] Patient Information 2013 [employer]     AMBULATORY SURGERY  DISCHARGE INSTRUCTIONS   1) The drugs that you were given will stay in your system until tomorrow so for the next 24 hours you should not:  A) Drive an automobile B) Make any legal decisions C) Drink any alcoholic beverage   2) You may resume regular meals tomorrow.  Today it is better to start with liquids and gradually work up to solid foods.  You may eat anything you prefer, but it is better to start with liquids, then soup and crackers, and gradually work up to solid  foods.   3) Please notify your doctor immediately if you have any unusual bleeding, trouble breathing, redness and pain at the surgery site, drainage, fever, or pain not relieved by medication.    4) Additional Instructions:        Please contact your physician with any problems or Same Day Surgery at [phone], Monday through Friday 6 am to 4 pm, or [place] [facility] [place] Main number at [phone].   

## 2019-11-02 NOTE — Transfer of Care (Signed)
Immediate Anesthesia Transfer of Care Note  Patient: Judith Fox  Procedure(s) Performed: EXAM UNDER ANESTHESIA (N/A ) INTRAUTERINE DEVICE (IUD) REMOVAL- MIRENA (N/A ) INTRAUTERINE DEVICE (IUD) INSERTION - LILETTA (N/A ) LAPAROSCOPY DIAGNOSTIC (N/A ) LAPAROSCOPIC OOPHO (Left ) LAPAROSCOPIC PERITUBALCYSTECTOMY (Right )  Patient Location: PACU  Anesthesia Type:General  Level of Consciousness: sedated  Airway & Oxygen Therapy: Patient Spontanous Breathing  Post-op Assessment: Report given to RN and Post -op Vital signs reviewed and stable  Post vital signs: Reviewed and stable  Last Vitals:  Vitals Value Taken Time  BP 124/84 11/02/19 1128  Temp 36.3 C 11/02/19 1128  Pulse 76 11/02/19 1133  Resp 18 11/02/19 1133  SpO2 97 % 11/02/19 1133  Vitals shown include unvalidated device data.  Last Pain:  Vitals:   11/02/19 1128  TempSrc:   PainSc: (P) Asleep         Complications: No apparent anesthesia complications

## 2019-11-05 LAB — CYTOLOGY - NON PAP

## 2019-11-06 LAB — SURGICAL PATHOLOGY

## 2019-12-07 NOTE — Progress Notes (Signed)
Established patient visit  I,April Miller,acting as a scribe for Wilhemena Durie, MD.,have documented all relevant documentation on the behalf of Wilhemena Durie, MD,as directed by  Wilhemena Durie, MD while in the presence of Wilhemena Durie, MD.   Patient: Judith Fox   DOB: [birth year]   54 y.o. Female  MRN: [id number] Visit Date: 12/13/2019  Today's healthcare provider: Wilhemena Durie, MD   Chief Complaint  Patient presents with  . Follow-up  . Diabetes   Subjective    HPI  Patient is married, second marriage, she has 2 daughters and 2 step daughters and she is expecting her first grandchild soon. She is doing well and take her medications as prescribed.  Her only complaint is 1 of recent cough and she has a history of chronic bronchitis.  She has some wheezing.  No fevers and no sign of infection.  No chills, no productive sputum.  No hemoptysis.  No shortness of breath. Diabetes Mellitus Type II, follow-up  Lab Results  Component Value Date   HGBA1C 6.5 (A) 12/13/2019   HGBA1C 6.3 (A) 08/16/2019   HGBA1C 6.5 (A) 05/02/2019   Last seen for diabetes 4 months ago.  Management since then includes continuing the same treatment. She reports good compliance with treatment. She is not having side effects. none  Home blood sugar records: fasting range: not checking  Episodes of hypoglycemia? No none   Current insulin regiment: Trulicity Most Recent Eye Exam: 07/04/2019  --------------------------------------------------------------------        Medications: Outpatient Medications Prior to Visit  Medication Sig  . aspirin EC 81 MG tablet Take 81 mg by mouth daily.  Marland Kitchen atorvastatin (LIPITOR) 40 MG tablet TAKE 1 TABLET BY MOUTH EVERY DAY (Patient taking differently: Take 40 mg by mouth every evening. )  . benazepril (LOTENSIN) 40 MG tablet TAKE 1 TABLET BY MOUTH EVERY DAY (Patient taking differently: Take 40 mg by mouth every morning. )  .  CALCIUM-VITAMIN D PO Take 1 tablet by mouth daily.  . diphenhydrAMINE (BENADRYL) 50 MG/ML injection Inject 50 mg into the muscle daily as needed (migraine).  Marland Kitchen docusate sodium (COLACE) 100 MG capsule Take 1 capsule (100 mg total) by mouth 2 (two) times daily. To keep stools soft  . doxycycline (PERIOSTAT) 20 MG tablet Take 1 tablet (20 mg total) by mouth 2 (two) times daily.  . fexofenadine (ALLEGRA) 180 MG tablet Take 180 mg by mouth in the morning.   . fluconazole (DIFLUCAN) 150 MG tablet Take 150 mg by mouth once a week. And prn  . glipiZIDE (GLUCOTROL) 10 MG tablet TAKE 1 TABLET (10 MG TOTAL) BY MOUTH DAILY BEFORE BREAKFAST.  Marland Kitchen ketorolac (TORADOL) 10 MG tablet Take 1 tablet (10 mg total) by mouth every 8 (eight) hours as needed for moderate pain (with food). (Patient taking differently: Take 10 mg by mouth every 8 (eight) hours as needed for moderate pain (with food). )  . ketorolac (TORADOL) 30 MG/ML injection Inject 30 mg into the muscle daily as needed (migrain).  Marland Kitchen levonorgestrel (MIRENA) 20 MCG/24HR IUD 1 each by Intrauterine route once.  . metFORMIN (GLUCOPHAGE) 1000 MG tablet TAKE 1 TABLET BY MOUTH 2 TIMES DAILY WITH A MEAL. (Patient taking differently: Take 1,000 mg by mouth 2 (two) times daily with a meal. )  . metoCLOPramide (REGLAN) 5 MG/ML injection Inject 10 mg into the vein daily as needed (migraine).   . metoCLOPramide (REGLAN) 5 MG/ML injection Inject 5 mg  into the muscle daily as needed (migraine).  . Multiple Vitamin (MULTIVITAMIN) capsule Take 1 capsule by mouth daily.  . naproxen (NAPROSYN) 500 MG tablet Take 1 tablet (500 mg total) by mouth 2 (two) times daily as needed. (Patient taking differently: Take 500 mg by mouth 2 (two) times daily with a meal. )  . nitroGLYCERIN (NITROSTAT) 0.4 MG SL tablet Place 1 tablet (0.4 mg total) under the tongue every 5 (five) minutes as needed for chest pain. (Patient taking differently: Place 0.4 mg under the tongue every 5 (five) minutes as  needed for chest pain. )  . omeprazole (PRILOSEC) 20 MG capsule TAKE 1 CAPSULE BY MOUTH 2 TIMES DAILY BEFORE A MEAL. (Patient taking differently: Take 20 mg by mouth 2 (two) times daily before a meal. )  . prochlorperazine (COMPAZINE) 10 MG tablet Take 1 tablet (10 mg total) by mouth every 6 (six) hours as needed for nausea or vomiting (migraine). (Patient taking differently: Take 10 mg by mouth every 6 (six) hours as needed for nausea or vomiting (migraine). )  . promethazine (PHENERGAN) 25 MG tablet Take 1 tablet (25 mg total) by mouth every 6 (six) hours as needed for nausea or vomiting. (Patient taking differently: Take 25 mg by mouth every 6 (six) hours as needed for nausea or vomiting. )  . RESTASIS 0.05 % ophthalmic emulsion Place 1 drop into both eyes 2 (two) times daily.   . TRULICITY [id number] 0000000 SOPN INJECT 0.75 MG INTO THE SKIN ONCE A WEEK. (Patient taking differently: Inject 0.75 mg into the skin every Monday. )  . gabapentin (NEURONTIN) 800 MG tablet Take 1 tablet (800 mg total) by mouth at bedtime for 14 days. Take nightly for 3 days, then up to 14 days as needed (Patient not taking: Reported on 12/13/2019)  . oxyCODONE (OXY IR/ROXICODONE) 5 MG immediate release tablet Take 1 tablet (5 mg total) by mouth every 4 (four) hours as needed for severe pain. (Patient not taking: Reported on 12/13/2019)   No facility-administered medications prior to visit.    Review of Systems  Constitutional: Negative for appetite change, chills, fatigue and fever.  HENT: Negative.   Eyes: Negative.   Respiratory: Positive for cough and wheezing. Negative for chest tightness and shortness of breath.   Cardiovascular: Negative for chest pain and palpitations.  Gastrointestinal: Negative for abdominal pain, nausea and vomiting.  Endocrine: Negative.   Allergic/Immunologic: Negative.   Neurological: Negative for dizziness and weakness.  Hematological: Negative.   Psychiatric/Behavioral: Negative.      Last hemoglobin A1c Lab Results  Component Value Date   HGBA1C 6.5 (A) 12/13/2019      Objective    BP 131/85 (BP Location: Right Arm, Cuff Size: Large)   Pulse 92   Temp (!) 96.6 F (35.9 C) (Other (Comment))   Resp 16   Ht 5\' 9"  (1.753 m)   Wt 189 lb (85.7 kg)   SpO2 98%   BMI 27.91 kg/m  Wt Readings from Last 3 Encounters:  12/13/19 189 lb (85.7 kg)  10/23/19 191 lb 6.4 oz (86.8 kg)  08/16/19 188 lb 6.4 oz (85.5 kg)      Physical Exam Vitals reviewed.  Constitutional:      Appearance: Normal appearance. She is well-developed and normal weight.  HENT:     Head: Normocephalic.     Right Ear: External ear normal.     Left Ear: External ear normal.     Nose: Nose normal.     Mouth/Throat:  Pharynx: No oropharyngeal exudate.  Eyes:     Conjunctiva/sclera: Conjunctivae normal.     Pupils: Pupils are equal, round, and reactive to light.  Cardiovascular:     Rate and Rhythm: Normal rate and regular rhythm.     Heart sounds: Normal heart sounds.  Pulmonary:     Effort: Pulmonary effort is normal. No respiratory distress.     Breath sounds: Wheezing present.  Abdominal:     Palpations: Abdomen is soft.  Musculoskeletal:        General: No tenderness.     Cervical back: Normal range of motion and neck supple.  Lymphadenopathy:     Cervical: No cervical adenopathy.  Skin:    General: Skin is warm and dry.  Neurological:     General: No focal deficit present.     Mental Status: She is alert and oriented to person, place, and time. Mental status is at baseline.  Psychiatric:        Mood and Affect: Mood normal.        Behavior: Behavior normal.        Thought Content: Thought content normal.        Judgment: Judgment normal.       Results for orders placed or performed in visit on 12/13/19  POCT glycosylated hemoglobin (Hb A1C)  Result Value Ref Range   Hemoglobin A1C 6.5 (A) 4.0 - 5.6 %   Est. average glucose Bld gHb Est-mCnc 140     Assessment &  Plan     1. Type 2 diabetes mellitus without complication, without long-term current use of insulin (HCC) Good control medication with 6.5 A1c.  Needs microalbumin on next visit. - POCT glycosylated hemoglobin (Hb A1C) 6.5 today.  2. Chronic bronchitis, unspecified chronic bronchitis type (HCC) Try prednisone 6 day taper.  May need further evaluation.  No antibiotics indicated.  No chest x-ray at this time.  This fits with her history and is probably pollen related/allergy related. - predniSONE (DELTASONE) 10 MG tablet; Take 6 Tablets Day 1, 5 Tablets Day 2, 4 Tablets Day 3, 3 Tablets Day 4, 2 Tablets Day 5, 1 Tablet Day 6  Dispense: 21 tablet; Refill: 0   Return in about 4 months (around 04/14/2020).      I, Wilhemena Durie, MD, have reviewed all documentation for this visit. The documentation on 12/18/19 for the exam, diagnosis, procedures, and orders are all accurate and complete.    Seriyah Collison Cranford Mon, MD  [facility] [phone] (phone) [phone] (fax)  [place]

## 2019-12-13 ENCOUNTER — Encounter: Payer: Self-pay | Admitting: Family Medicine

## 2019-12-13 ENCOUNTER — Ambulatory Visit: Payer: 59 | Admitting: Family Medicine

## 2019-12-13 ENCOUNTER — Other Ambulatory Visit: Payer: Self-pay

## 2019-12-13 VITALS — BP 131/85 | HR 92 | Temp 96.6°F | Resp 16 | Ht 69.0 in | Wt 189.0 lb

## 2019-12-13 DIAGNOSIS — J42 Unspecified chronic bronchitis: Secondary | ICD-10-CM | POA: Diagnosis not present

## 2019-12-13 DIAGNOSIS — E119 Type 2 diabetes mellitus without complications: Secondary | ICD-10-CM | POA: Diagnosis not present

## 2019-12-13 LAB — POCT GLYCOSYLATED HEMOGLOBIN (HGB A1C)
Est. average glucose Bld gHb Est-mCnc: 140
Hemoglobin A1C: 6.5 % — AB (ref 4.0–5.6)

## 2019-12-13 MED ORDER — PREDNISONE 10 MG PO TABS: ORAL_TABLET | ORAL | 0 refills | Status: DC

## 2020-01-03 ENCOUNTER — Ambulatory Visit: Payer: 59 | Admitting: Physician Assistant

## 2020-01-03 NOTE — Progress Notes (Deleted)
Established patient visit   Patient: Judith Fox   DOB: [birth year]   54 y.o. Female  MRN: [id number] Visit Date: 01/03/2020  Today's healthcare provider: Trinna Post, PA-C   No chief complaint on file.  Subjective    Knee Pain  Incident onset: 2 weeks ago. The injury mechanism was a fall. The pain is present in the right knee.    {Show patient history (optional):23778::" "}   Medications: Outpatient Medications Prior to Visit  Medication Sig  . aspirin EC 81 MG tablet Take 81 mg by mouth daily.  Marland Kitchen atorvastatin (LIPITOR) 40 MG tablet TAKE 1 TABLET BY MOUTH EVERY DAY (Patient taking differently: Take 40 mg by mouth every evening. )  . benazepril (LOTENSIN) 40 MG tablet TAKE 1 TABLET BY MOUTH EVERY DAY (Patient taking differently: Take 40 mg by mouth every morning. )  . CALCIUM-VITAMIN D PO Take 1 tablet by mouth daily.  . diphenhydrAMINE (BENADRYL) 50 MG/ML injection Inject 50 mg into the muscle daily as needed (migraine).  Marland Kitchen docusate sodium (COLACE) 100 MG capsule Take 1 capsule (100 mg total) by mouth 2 (two) times daily. To keep stools soft  . doxycycline (PERIOSTAT) 20 MG tablet Take 1 tablet (20 mg total) by mouth 2 (two) times daily.  . fexofenadine (ALLEGRA) 180 MG tablet Take 180 mg by mouth in the morning.   . fluconazole (DIFLUCAN) 150 MG tablet Take 150 mg by mouth once a week. And prn  . gabapentin (NEURONTIN) 800 MG tablet Take 1 tablet (800 mg total) by mouth at bedtime for 14 days. Take nightly for 3 days, then up to 14 days as needed (Patient not taking: Reported on 12/13/2019)  . glipiZIDE (GLUCOTROL) 10 MG tablet TAKE 1 TABLET (10 MG TOTAL) BY MOUTH DAILY BEFORE BREAKFAST.  Marland Kitchen ketorolac (TORADOL) 10 MG tablet Take 1 tablet (10 mg total) by mouth every 8 (eight) hours as needed for moderate pain (with food). (Patient taking differently: Take 10 mg by mouth every 8 (eight) hours as needed for moderate pain (with food). )  . ketorolac (TORADOL) 30  MG/ML injection Inject 30 mg into the muscle daily as needed (migrain).  Marland Kitchen levonorgestrel (MIRENA) 20 MCG/24HR IUD 1 each by Intrauterine route once.  . metFORMIN (GLUCOPHAGE) 1000 MG tablet TAKE 1 TABLET BY MOUTH 2 TIMES DAILY WITH A MEAL. (Patient taking differently: Take 1,000 mg by mouth 2 (two) times daily with a meal. )  . metoCLOPramide (REGLAN) 5 MG/ML injection Inject 10 mg into the vein daily as needed (migraine).   . metoCLOPramide (REGLAN) 5 MG/ML injection Inject 5 mg into the muscle daily as needed (migraine).  . Multiple Vitamin (MULTIVITAMIN) capsule Take 1 capsule by mouth daily.  . naproxen (NAPROSYN) 500 MG tablet Take 1 tablet (500 mg total) by mouth 2 (two) times daily as needed. (Patient taking differently: Take 500 mg by mouth 2 (two) times daily with a meal. )  . nitroGLYCERIN (NITROSTAT) 0.4 MG SL tablet Place 1 tablet (0.4 mg total) under the tongue every 5 (five) minutes as needed for chest pain. (Patient taking differently: Place 0.4 mg under the tongue every 5 (five) minutes as needed for chest pain. )  . omeprazole (PRILOSEC) 20 MG capsule TAKE 1 CAPSULE BY MOUTH 2 TIMES DAILY BEFORE A MEAL. (Patient taking differently: Take 20 mg by mouth 2 (two) times daily before a meal. )  . oxyCODONE (OXY IR/ROXICODONE) 5 MG immediate release tablet Take 1 tablet (5 mg  total) by mouth every 4 (four) hours as needed for severe pain. (Patient not taking: Reported on 12/13/2019)  . predniSONE (DELTASONE) 10 MG tablet Take 6 Tablets Day 1, 5 Tablets Day 2, 4 Tablets Day 3, 3 Tablets Day 4, 2 Tablets Day 5, 1 Tablet Day 6  . prochlorperazine (COMPAZINE) 10 MG tablet Take 1 tablet (10 mg total) by mouth every 6 (six) hours as needed for nausea or vomiting (migraine). (Patient taking differently: Take 10 mg by mouth every 6 (six) hours as needed for nausea or vomiting (migraine). )  . promethazine (PHENERGAN) 25 MG tablet Take 1 tablet (25 mg total) by mouth every 6 (six) hours as needed for  nausea or vomiting. (Patient taking differently: Take 25 mg by mouth every 6 (six) hours as needed for nausea or vomiting. )  . RESTASIS 0.05 % ophthalmic emulsion Place 1 drop into both eyes 2 (two) times daily.   . TRULICITY A999333 0000000 SOPN INJECT 0.75 MG INTO THE SKIN ONCE A WEEK. (Patient taking differently: Inject 0.75 mg into the skin every Monday. )   No facility-administered medications prior to visit.    Review of Systems  {Heme  Chem  Endocrine  Serology  Results Review (optional):23779::" "}  Objective    There were no vitals taken for this visit. {Show previous vital signs (optional):23777::" "}  Physical Exam  ***  No results found for any visits on 01/03/20.  Assessment & Plan     ***  No follow-ups on file.      {provider attestation***:1}   Paulene Floor  [facility] [phone] (phone) [phone] (fax)  [place]

## 2020-01-05 ENCOUNTER — Other Ambulatory Visit: Payer: Self-pay | Admitting: Family Medicine

## 2020-01-05 DIAGNOSIS — L719 Rosacea, unspecified: Secondary | ICD-10-CM

## 2020-01-05 NOTE — Telephone Encounter (Signed)
Requested Prescriptions  Pending Prescriptions Disp Refills  . doxycycline (PERIOSTAT) 20 MG tablet [Pharmacy Med Name: DOXYCYCLINE HYCLATE 20 MG TAB] 180 tablet 1    Sig: TAKE 1 TABLET BY MOUTH TWICE A DAY     Off-Protocol Failed - 01/05/2020  9:57 AM      Failed - Medication not assigned to a protocol, review manually.      Passed - Valid encounter within last 12 months    Recent Outpatient Visits          3 weeks ago Type 2 diabetes mellitus without complication, without long-term current use of insulin [facility])   [facility] Jerrol Banana., MD   2 months ago Left lower quadrant abdominal pain   [facility] [place], Kelby Aline, FNP   3 months ago Sinusitis, unspecified chronicity, unspecified location   [facility] Jerrol Banana., MD   4 months ago Type 2 diabetes mellitus without complication, without long-term current use of insulin [facility])   [facility] Jerrol Banana., MD   7 months ago Type 2 diabetes mellitus without complication, without long-term current use of insulin [facility])   [facility] Jerrol Banana., MD      Future Appointments            In 3 months Jerrol Banana., MD [facility]

## 2020-01-11 ENCOUNTER — Other Ambulatory Visit: Payer: Self-pay | Admitting: Family Medicine

## 2020-01-11 DIAGNOSIS — E119 Type 2 diabetes mellitus without complications: Secondary | ICD-10-CM

## 2020-01-18 ENCOUNTER — Other Ambulatory Visit: Payer: Self-pay

## 2020-01-18 ENCOUNTER — Emergency Department
Admission: EM | Admit: 2020-01-18 | Discharge: 2020-01-18 | Disposition: A | Discharge status: Home / Self Care | Payer: 59 | Attending: Student | Admitting: Student

## 2020-01-18 ENCOUNTER — Emergency Department: Payer: 59

## 2020-01-18 ENCOUNTER — Ambulatory Visit: Payer: Self-pay | Admitting: *Deleted

## 2020-01-18 ENCOUNTER — Telehealth: Payer: 59 | Admitting: Physician Assistant

## 2020-01-18 DIAGNOSIS — R197 Diarrhea, unspecified: Secondary | ICD-10-CM | POA: Insufficient documentation

## 2020-01-18 DIAGNOSIS — R1030 Lower abdominal pain, unspecified: Secondary | ICD-10-CM | POA: Diagnosis not present

## 2020-01-18 DIAGNOSIS — R112 Nausea with vomiting, unspecified: Secondary | ICD-10-CM | POA: Insufficient documentation

## 2020-01-18 DIAGNOSIS — K529 Noninfective gastroenteritis and colitis, unspecified: Secondary | ICD-10-CM | POA: Diagnosis not present

## 2020-01-18 LAB — LIPASE, BLOOD: Lipase: 44 U/L (ref 11–51)

## 2020-01-18 LAB — COMPREHENSIVE METABOLIC PANEL
ALT: 32 U/L (ref 0–44)
AST: 35 U/L (ref 15–41)
Albumin: 4.3 g/dL (ref 3.5–5.0)
Alkaline Phosphatase: 60 U/L (ref 38–126)
Anion gap: 10 (ref 5–15)
BUN: 24 mg/dL — ABNORMAL HIGH (ref 6–20)
CO2: 22 mmol/L (ref 22–32)
Calcium: 9.6 mg/dL (ref 8.9–10.3)
Chloride: 102 mmol/L (ref 98–111)
Creatinine, Ser: 0.84 mg/dL (ref 0.44–1.00)
GFR calc Af Amer: >60 mL/min (ref >60)
GFR calc non Af Amer: >60 mL/min (ref >60)
Glucose, Bld: 220 mg/dL — ABNORMAL HIGH (ref 70–99)
Potassium: 4.9 mmol/L (ref 3.5–5.1)
Sodium: 134 mmol/L — ABNORMAL LOW (ref 135–145)
Total Bilirubin: 1 mg/dL (ref 0.3–1.2)
Total Protein: 6.7 g/dL (ref 6.5–8.1)

## 2020-01-18 LAB — CBC
HCT: 43.4 % (ref 36.0–46.0)
Hemoglobin: 14.3 g/dL (ref 12.0–15.0)
MCH: 27.7 pg (ref 26.0–34.0)
MCHC: 32.9 g/dL (ref 30.0–36.0)
MCV: 83.9 fL (ref 80.0–100.0)
Platelets: 225 10*3/uL (ref 150–400)
RBC: 5.17 MIL/uL — ABNORMAL HIGH (ref 3.87–5.11)
RDW: 13.5 % (ref 11.5–15.5)
WBC: 17.1 10*3/uL — ABNORMAL HIGH (ref 4.0–10.5)
nRBC: 0 % (ref 0.0–0.2)

## 2020-01-18 IMAGING — CT CT ABD-PELV W/ CM
2 of 5 series · 16 of 46 positions shown, 18 images · IV contrast (omnipaque)
Comparison: Renal sonogram from [[facility]]

CLINICAL DATA: Lower abdominal pain, GI symptoms and leukocytosis.
This suspicious for colitis or diverticulitis

EXAM:
CT ABDOMEN AND PELVIS WITH CONTRAST
TECHNIQUE: Multidetector CT imaging of the abdomen and pelvis was performed
using the standard protocol following bolus administration of
intravenous contrast.
CONTRAST:  100mL OMNIPAQUE IOHEXOL 300 MG/ML  SOLN

[Series 2: axial st · axial · 0.71mm/px · z∈[-473,-43]mm · 13 of 98 slices shown, 15 images]
[im 6/98  soft-tissue]
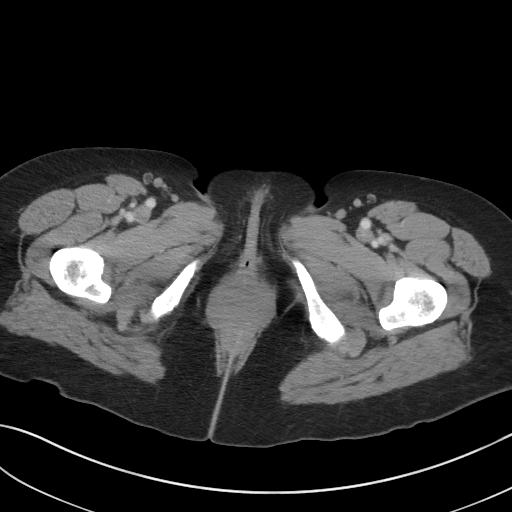
[im 6/98  bone]
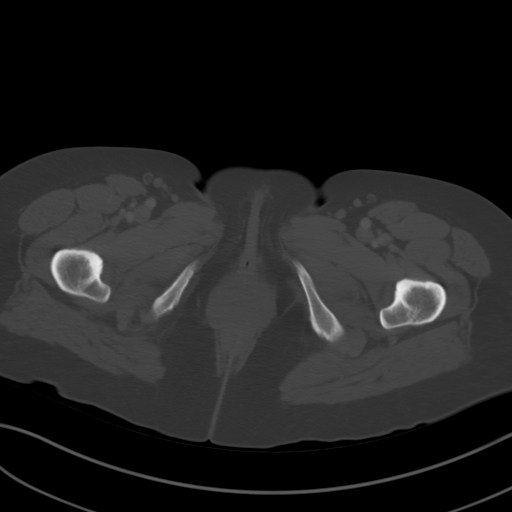
[im 16/98  soft-tissue]
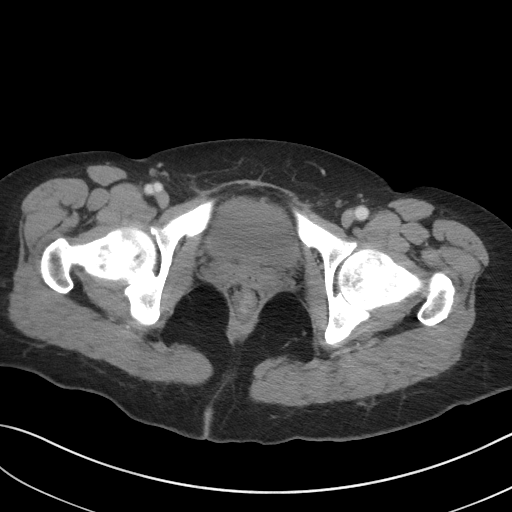
[im 21/98  soft-tissue]
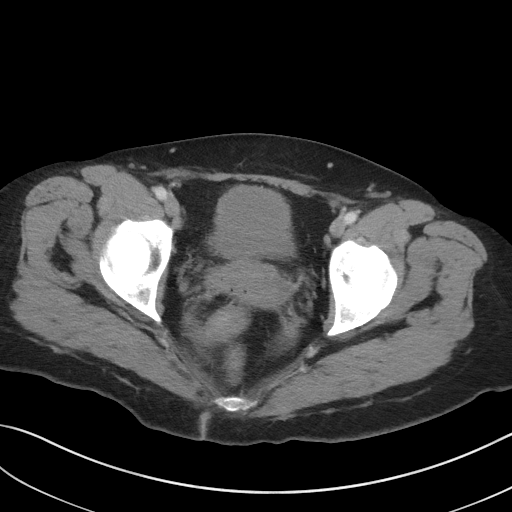
[im 26/98  soft-tissue]
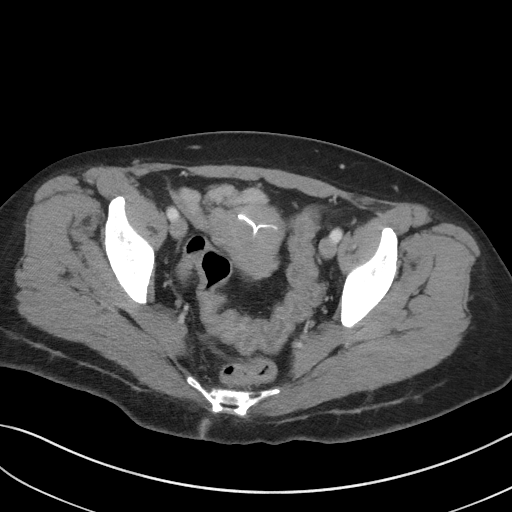
[im 36/98  soft-tissue]
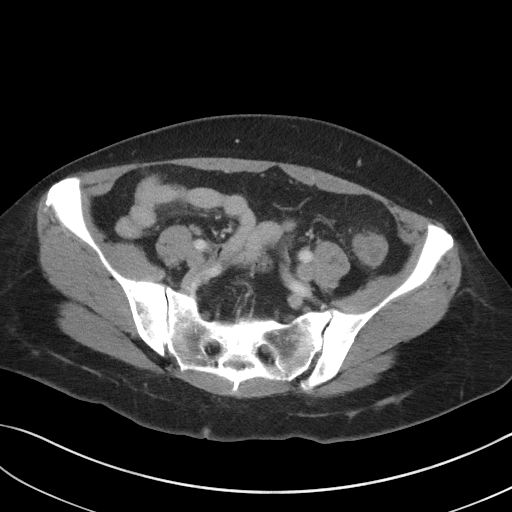
[im 41/98  soft-tissue]
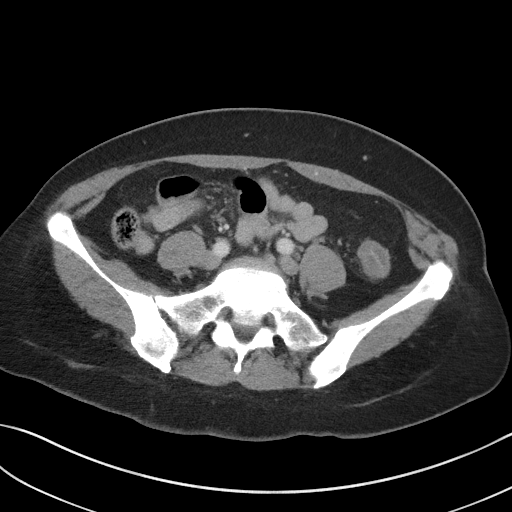
[im 52/98  soft-tissue]
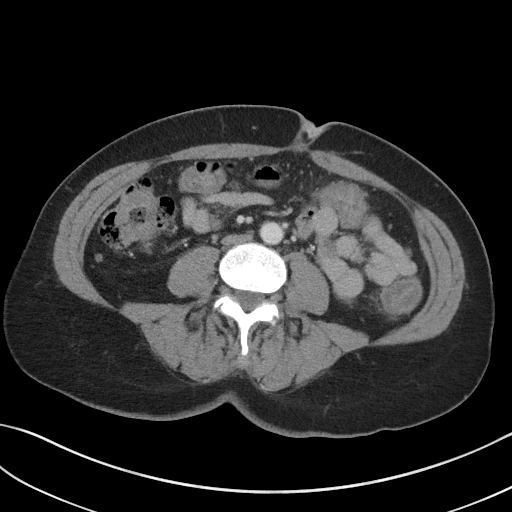
[im 57/98  soft-tissue]
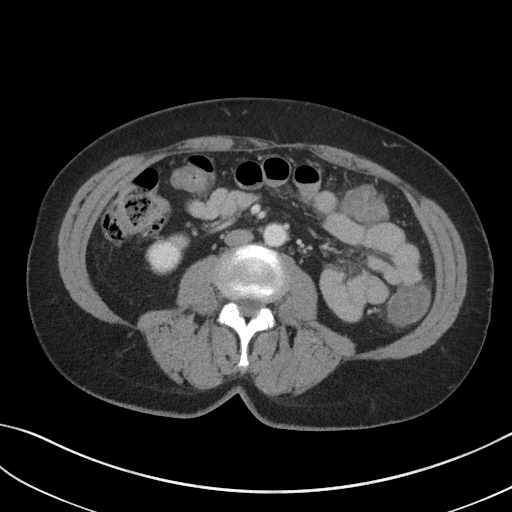
[im 62/98  soft-tissue]
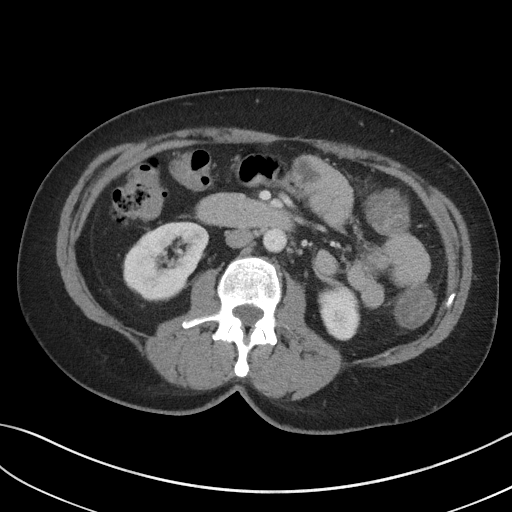
[im 62/98  bone]
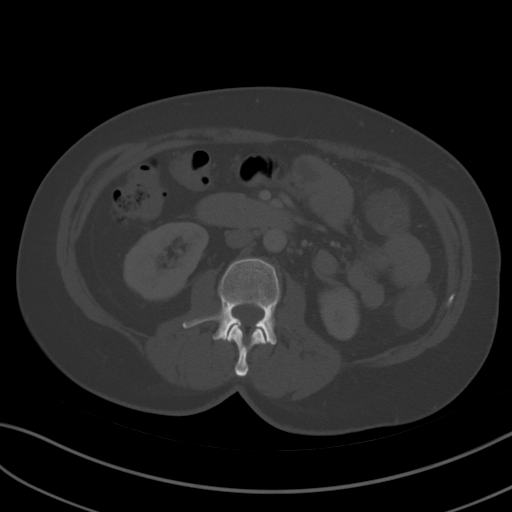
[im 72/98  soft-tissue]
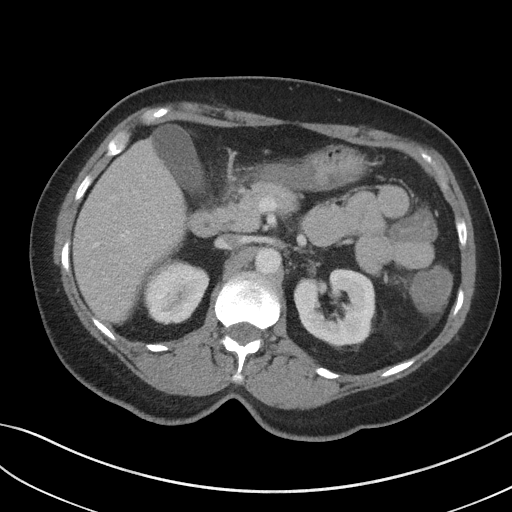
[im 77/98  soft-tissue]
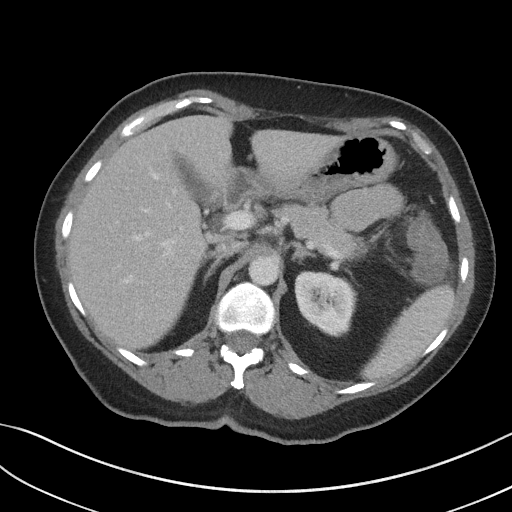
[im 82/98  soft-tissue]
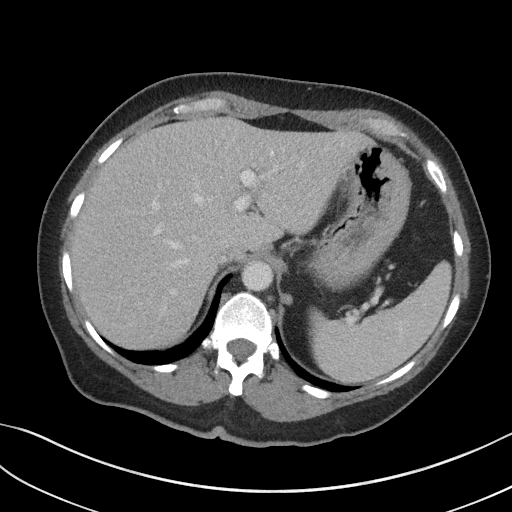
[im 92/98  soft-tissue]
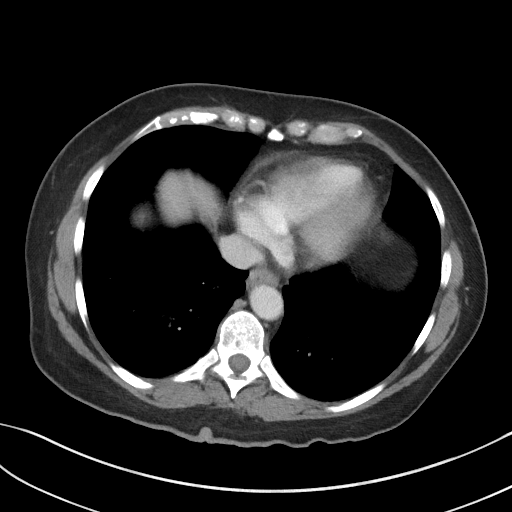

[Series 6: coronal st · coronal · 0.92mm/px · 3 of 90 slices shown]
[im 30/90  soft-tissue]
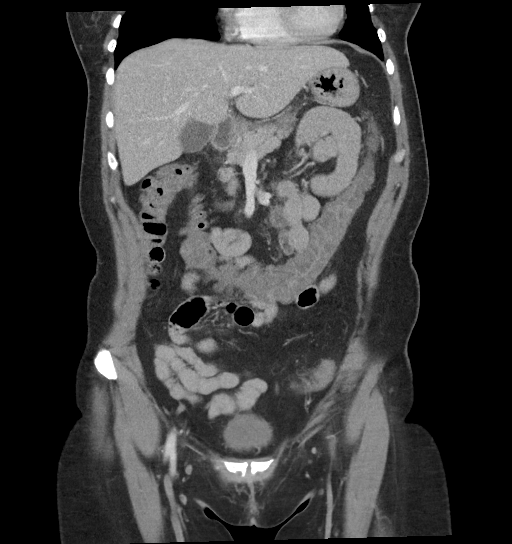
[im 40/90  soft-tissue]
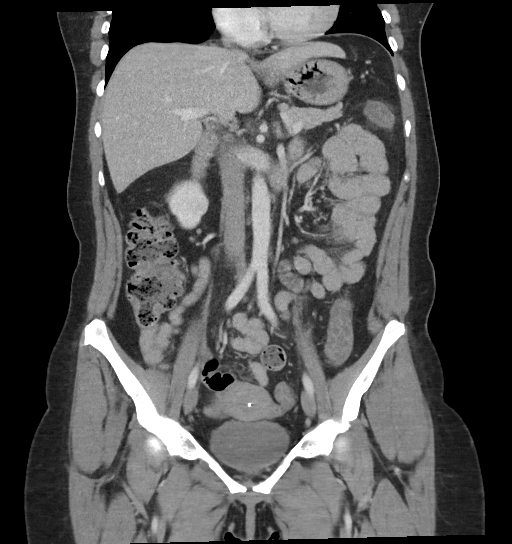
[im 50/90  soft-tissue]
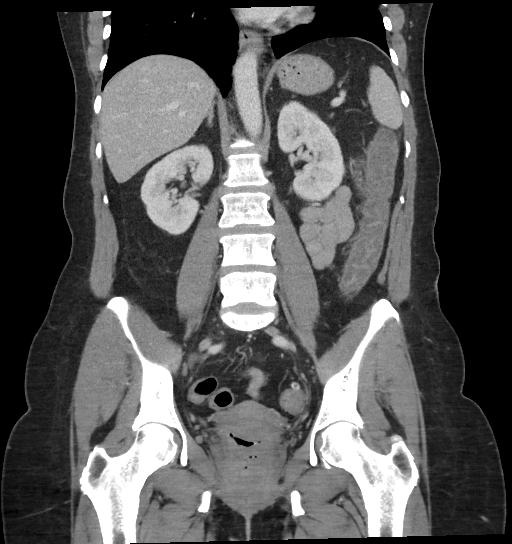

[16 of 46 positions shown; findings below may reference images not displayed]

FINDINGS: Lower chest: Mild basilar atelectasis. No consolidation. No pleural
effusion.

Hepatobiliary: Liver is normal without focal lesion. Portal vein is
patent. No pericholecystic stranding or biliary ductal dilation.

Pancreas: Pancreas is normal without ductal dilation or
inflammation.

Spleen: Spleen normal size and contour.

Adrenals/Urinary Tract: Adrenal glands are normal.

Symmetric renal enhancement. No signs of hydronephrosis. Urinary
bladder under distended limiting assessment.

Stomach/Bowel: Stomach is under distended question mild antral
thickening. Small bowel with mild distension distally perhaps mild
focal ileus adjacent to inflamed colon.

Small area of calcification intimately associated with small bowel
loops in the RIGHT lower quadrant. This area measures approximately
10 x 5 mm. The appendix is normal.

Marked colonic thickening and pericolonic stranding. No sign of free
air. Trace pelvic fluid. No colonic pneumatosis.

Vascular/Lymphatic: Normal caliber abdominal aorta. Mesenteric
vasculature is patent. No adenopathy in the upper abdomen.

No pelvic lymphadenopathy.

Reproductive: IUD within the uterus. Uterine and adnexal appearance
on CT is otherwise unremarkable.

Other: No free air. Trace ascites. Small umbilical hernia containing
fat.

Musculoskeletal: No acute bone process. No destructive bone finding.
IMPRESSION: 1. Marked colonic thickening and pericolonic stranding with trace
pelvic fluid. Findings are consistent with colitis, potentially
infectious or inflammatory based on distribution, correlate with any
recent antibiotic administration or risk for C difficile colitis.
2. Small area of calcification intimately associated with small
bowel loops in the RIGHT lower quadrant. This area measures
approximately 10 x 5 mm. This may represent a small calcified lymph
node, perhaps related to prior inflammation. Clinical significance
is doubtful but uncertain. Based on very close association with
small-bowel loops a associated lesion or calcification with Meckel's
diverticulum is also considered. Suggest CT enterography on
follow-up for further evaluation on a nonemergent basis.
3. Mild distension of distal small bowel loops perhaps mild focal
ileus adjacent to inflamed colon.
4. Trace ascites.
5. IUD within the uterus.
6. Small umbilical hernia containing fat.

## 2020-01-18 MED ORDER — FENTANYL CITRATE (PF) 100 MCG/2ML IJ SOLN
50.0000 ug | Freq: Once | INTRAMUSCULAR | Status: AC
  Administered 2020-01-18: 50 ug via INTRAVENOUS
  Filled 2020-01-18: qty 2

## 2020-01-18 MED ORDER — SODIUM CHLORIDE 0.9% FLUSH: 3.0000 mL | Freq: Once | INTRAVENOUS | Status: DC

## 2020-01-18 MED ORDER — ONDANSETRON HCL 4 MG/2ML IJ SOLN
4.0000 mg | Freq: Once | INTRAMUSCULAR | Status: AC
  Administered 2020-01-18: 4 mg via INTRAVENOUS
  Filled 2020-01-18: qty 2

## 2020-01-18 MED ORDER — ONDANSETRON 4 MG PO TBDP
8.0000 mg | ORAL_TABLET | Freq: Once | ORAL | Status: AC
  Administered 2020-01-18: 8 mg via ORAL

## 2020-01-18 MED ORDER — IOHEXOL 300 MG/ML  SOLN
100.0000 mL | Freq: Once | INTRAMUSCULAR | Status: AC | PRN
  Administered 2020-01-18: 100 mL via INTRAVENOUS

## 2020-01-18 MED ORDER — CIPROFLOXACIN HCL 500 MG PO TABS
500.0000 mg | ORAL_TABLET | Freq: Once | ORAL | Status: AC
  Administered 2020-01-18: 500 mg via ORAL
  Filled 2020-01-18: qty 1

## 2020-01-18 MED ORDER — METRONIDAZOLE 500 MG PO TABS
500.0000 mg | ORAL_TABLET | Freq: Once | ORAL | Status: AC
  Administered 2020-01-18: 500 mg via ORAL
  Filled 2020-01-18: qty 1

## 2020-01-18 MED ORDER — CIPROFLOXACIN HCL 500 MG PO TABS: 500.0000 mg | ORAL_TABLET | Freq: Two times a day (BID) | ORAL | 0 refills | Status: AC

## 2020-01-18 MED ORDER — METRONIDAZOLE 500 MG PO TABS: 500.0000 mg | ORAL_TABLET | Freq: Three times a day (TID) | ORAL | 0 refills | Status: AC

## 2020-01-18 MED ORDER — ONDANSETRON 4 MG PO TBDP: 4.0000 mg | ORAL_TABLET | Freq: Three times a day (TID) | ORAL | 0 refills | Status: DC | PRN

## 2020-01-18 MED ORDER — SODIUM CHLORIDE 0.9 % IV BOLUS
1000.0000 mL | Freq: Once | INTRAVENOUS | Status: AC
  Administered 2020-01-18: 1000 mL via INTRAVENOUS

## 2020-01-18 NOTE — Telephone Encounter (Signed)
Patient went out to dinner last night- quickly did not feel well. Ever since diarrhea and vomiting.

## 2020-01-18 NOTE — Telephone Encounter (Signed)
Patient is calling to report that she thinks she has food poisoning. Patient has experienced diarrhea, abdominal pain and vomiting since eating out last night. Her husband is having symptoms also- but not as severe. Patient is seeing blood in stool and experiencing signs of dehydration. Per protocol- advised ED- appointment in office canceled. Reason for Disposition . [1] Blood in the stool AND [2] moderate or large amount of blood  Answer Assessment - Initial Assessment Questions 1. DIARRHEA SEVERITY: "How bad is the diarrhea?" "How many extra stools have you had in the past 24 hours than normal?"    - NO DIARRHEA (SCALE 0)   - MILD (SCALE 1-3): Few loose or mushy BMs; increase of 1-3 stools over normal daily number of stools; mild increase in ostomy output.   -  MODERATE (SCALE 4-7): Increase of 4-6 stools daily over normal; moderate increase in ostomy output. * SEVERE (SCALE 8-10; OR 'WORST POSSIBLE'): Increase of 7 or more stools daily over normal; moderate increase in ostomy output; incontinence.     severe 2. ONSET: "When did the diarrhea begin?"      Last night 3. BM CONSISTENCY: "How loose or watery is the diarrhea?"      watery 4. VOMITING: "Are you also vomiting?" If so, ask: "How many times in the past 24 hours?"      Yes- 2-3 hours projectile vomiting 5. ABDOMINAL PAIN: "Are you having any abdominal pain?" If yes: "What does it feel like?" (e.g., crampy, dull, intermittent, constant)      Yes- stomach hurts bad 6. ABDOMINAL PAIN SEVERITY: If present, ask: "How bad is the pain?"  (e.g., Scale 1-10; mild, moderate, or severe)   - MILD (1-3): doesn't interfere with normal activities, abdomen soft and not tender to touch    - MODERATE (4-7): interferes with normal activities or awakens from sleep, tender to touch    - SEVERE (8-10): excruciating pain, doubled over, unable to do any normal activities       severe 7. ORAL INTAKE: If vomiting, "Have you been able to drink liquids?" "How  much fluids have you had in the past 24 hours?"     Not able to intake- goes straight through 8. HYDRATION: "Any signs of dehydration?" (e.g., dry mouth [not just dry lips], too weak to stand, dizziness, new weight loss) "When did you last urinate?"     Dry mouth/lips dry, not sure- during the night 9. EXPOSURE: "Have you traveled to a foreign country recently?" "Have you been exposed to anyone with diarrhea?" "Could you have eaten any food that was spoiled?"     Possible food poisoning 10. ANTIBIOTIC USE: "Are you taking antibiotics now or have you taken antibiotics in the past 2 months?"       no 11. OTHER SYMPTOMS: "Do you have any other symptoms?" (e.g., fever, blood in stool)       Blood in the stool now- 2 hours  Protocols used: DIARRHEA-A-AH

## 2020-01-18 NOTE — ED Triage Notes (Signed)
Pt comes via POV from home with c/o food poisoning. Pt states this started earlier this am. Pt states vomiting, indigestion,  Heartburn and nausea.  Pt states blood in stools.

## 2020-01-18 NOTE — ED Provider Notes (Signed)
[facility] Emergency Department Provider Note  ____________________________________________   First MD Initiated Contact with Patient 01/18/20 1217     (approximate)  I have reviewed the triage vital signs and the nursing notes.  History  Chief Complaint Food Poisoning    HPI Judith Fox is a 54 y.o. female past medical history as below, who presents to the emergency department for nausea, vomiting, diarrhea, lower abdominal pain.  Patient states symptoms all started after eating out at a restaurant last night around 6 PM.  Initially after she got home she developed some heartburn-like symptoms and felt like her stomach was loud/gurgling.  Her husband who ate with her had an episode of diarrhea, and then his symptoms resolved.  However, for the patient, she woke up in the middle of the night with sudden lower abdominal pain, and multiple episodes of vomiting and diarrhea.  Initially nonbloody, but now her diarrhea has progressed to include bright red blood as well.  Associated abdominal pain is lower in location, cramping/sharp, moderate in severity.  No radiation.  No alleviating/aggravating components.  Not on any blood thinning medications.   Past Medical Hx Past Medical History:  Diagnosis Date  . Allergy   . Arthritis    KNEE  . Asthma    "mild" per pt  . Complication of anesthesia    PT HAD A MI AFTER KNEE SURGERY IN 2014 FROM HIGH BLOOD PRESSURE-SURGERY WAS DONE AT AN OUTPATIENT SURGERY CENTER  . Diabetes mellitus without complication ([place])   . History of hiatal hernia   . History of kidney stones   . Hypertension   . Migraine   . Myocardial infarction ([place]) 2014   no stents  . Pneumonia    h/o    Problem List Patient Active Problem List   Diagnosis Date Noted  . IUD (intrauterine device) in place 10/25/2019  . Cyst of left ovary 10/25/2019  . Acute left-sided low back pain without sciatica 10/24/2019  . Left lower quadrant  abdominal pain 10/23/2019  . Simple chronic bronchitis ([place]) 09/26/2019  . Varicose veins of both lower extremities with pain 05/16/2018  . Bilateral lower extremity edema 05/16/2018  . Mixed hyperlipidemia 09/05/2017  . Anxiety 06/26/2014  . Syncope and collapse 07/30/2013  . Palpitations 03/11/2013  . Diabetes ([place]) 03/01/2013  . History of non-ST elevation myocardial infarction (NSTEMI) 03/01/2013  . Hypertension associated with diabetes ([place]) 03/01/2013  . 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  . Chronic pain syndrome 12/27/2011  . 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 Chronic migraine without aura 10/12/2011  . Migraine without aura 10/12/2011    Past Surgical Hx Past Surgical History:  Procedure Laterality Date  . ANAL FISSURE REPAIR    . INTRAUTERINE DEVICE (IUD) INSERTION N/A 11/02/2019   Procedure: INTRAUTERINE DEVICE (IUD) INSERTION - LILETTA;  Surgeon: Benjaman Kindler, MD;  Location: [facility] ORS;  Service: Gynecology;  Laterality: N/A;  . IUD REMOVAL N/A 11/02/2019   Procedure: INTRAUTERINE DEVICE (IUD) REMOVAL- MIRENA;  Surgeon: Benjaman Kindler, MD;  Location: [facility] ORS;  Service: Gynecology;  Laterality: N/A;  . JOINT REPLACEMENT Right    knee replacement  . KNEE ARTHROSCOPY    . LAPAROSCOPIC OVARIAN CYSTECTOMY Right 11/02/2019   Procedure: LAPAROSCOPIC PERITUBALCYSTECTOMY;  Surgeon: Benjaman Kindler, MD;  Location: [facility] ORS;  Service: Gynecology;  Laterality: Right;  . LAPAROSCOPIC SALPINGO OOPHERECTOMY Left 11/02/2019   Procedure: LAPAROSCOPIC OOPHO;  Surgeon: Benjaman Kindler, MD;  Location: [facility] ORS;  Service: Gynecology;  Laterality: Left;  .  LAPAROSCOPY N/A 11/02/2019   Procedure: LAPAROSCOPY DIAGNOSTIC WITH PELVIC WASHINGS;  Surgeon: Benjaman Kindler, MD;  Location: [facility] ORS;  Service: Gynecology;  Laterality: N/A;  . TONSILLECTOMY    . WISDOM TOOTH EXTRACTION      Medications Prior to Admission medications   Medication Sig Start Date End Date Taking?  Authorizing Provider  aspirin EC 81 MG tablet Take 81 mg by mouth daily.    [provider]  atorvastatin (LIPITOR) 40 MG tablet TAKE 1 TABLET BY MOUTH EVERY DAY Patient taking differently: Take 40 mg by mouth every evening.  07/12/19   Jerrol Banana., MD  benazepril (LOTENSIN) 40 MG tablet TAKE 1 TABLET BY MOUTH EVERY DAY Patient taking differently: Take 40 mg by mouth every morning.  07/26/19   Jerrol Banana., MD  CALCIUM-VITAMIN D PO Take 1 tablet by mouth daily.    [provider]  diphenhydrAMINE (BENADRYL) 50 MG/ML injection Inject 50 mg into the muscle daily as needed (migraine).    [provider]  docusate sodium (COLACE) 100 MG capsule Take 1 capsule (100 mg total) by mouth 2 (two) times daily. To keep stools soft 11/02/19   Benjaman Kindler, MD  doxycycline (PERIOSTAT) 20 MG tablet TAKE 1 TABLET BY MOUTH TWICE A DAY 01/05/20   Jerrol Banana., MD  fexofenadine (ALLEGRA) 180 MG tablet Take 180 mg by mouth in the morning.     [provider]  fluconazole (DIFLUCAN) 150 MG tablet Take 150 mg by mouth once a week. And prn    [provider]  gabapentin (NEURONTIN) 800 MG tablet Take 1 tablet (800 mg total) by mouth at bedtime for 14 days. Take nightly for 3 days, then up to 14 days as needed Patient not taking: Reported on 12/13/2019 11/02/19 11/16/19  Benjaman Kindler, MD  glipiZIDE (GLUCOTROL) 10 MG tablet TAKE 1 TABLET (10 MG TOTAL) BY MOUTH DAILY BEFORE BREAKFAST. 07/12/19   Jerrol Banana., MD  ketorolac (TORADOL) 10 MG tablet Take 1 tablet (10 mg total) by mouth every 8 (eight) hours as needed for moderate pain (with food). Patient taking differently: Take 10 mg by mouth every 8 (eight) hours as needed for moderate pain (with food).  10/04/17   Eula Listen, MD  ketorolac (TORADOL) 30 MG/ML injection Inject 30 mg into the muscle daily as needed (migrain).    [provider]  levonorgestrel (MIRENA) 20  MCG/24HR IUD 1 each by Intrauterine route once.    [provider]  metFORMIN (GLUCOPHAGE) 1000 MG tablet TAKE 1 TABLET BY MOUTH 2 TIMES DAILY WITH A MEAL. Patient taking differently: Take 1,000 mg by mouth 2 (two) times daily with a meal.  07/12/19   Jerrol Banana., MD  metoCLOPramide [facility]) 5 MG/ML injection Inject 10 mg into the vein daily as needed (migraine).     [provider]  metoCLOPramide (REGLAN) 5 MG/ML injection Inject 5 mg into the muscle daily as needed (migraine).    [provider]  Multiple Vitamin (MULTIVITAMIN) capsule Take 1 capsule by mouth daily.    [provider]  naproxen (NAPROSYN) 500 MG tablet Take 1 tablet (500 mg total) by mouth 2 (two) times daily as needed. Patient taking differently: Take 500 mg by mouth 2 (two) times daily with a meal.  04/15/19   Jerrol Banana., MD  nitroGLYCERIN (NITROSTAT) 0.4 MG SL tablet Place 1 tablet (0.4 mg total) under the tongue every 5 (five) minutes as  needed for chest pain. Patient taking differently: Place 0.4 mg under the tongue every 5 (five) minutes as needed for chest pain.  08/22/17   Jerrol Banana., MD  omeprazole (PRILOSEC) 20 MG capsule TAKE 1 CAPSULE BY MOUTH 2 TIMES DAILY BEFORE A MEAL. Patient taking differently: Take 20 mg by mouth 2 (two) times daily before a meal.  07/12/19   Jerrol Banana., MD  oxyCODONE (OXY IR/ROXICODONE) 5 MG immediate release tablet Take 1 tablet (5 mg total) by mouth every 4 (four) hours as needed for severe pain. Patient not taking: Reported on 12/13/2019 11/02/19   Benjaman Kindler, MD  predniSONE (DELTASONE) 10 MG tablet Take 6 Tablets Day 1, 5 Tablets Day 2, 4 Tablets Day 3, 3 Tablets Day 4, 2 Tablets Day 5, 1 Tablet Day 6 12/13/19   Jerrol Banana., MD  prochlorperazine (COMPAZINE) 10 MG tablet Take 1 tablet (10 mg total) by mouth every 6 (six) hours as needed for nausea or vomiting (migraine). Patient taking differently: Take  10 mg by mouth every 6 (six) hours as needed for nausea or vomiting (migraine).  10/04/17   Eula Listen, MD  promethazine (PHENERGAN) 25 MG tablet Take 1 tablet (25 mg total) by mouth every 6 (six) hours as needed for nausea or vomiting. Patient taking differently: Take 25 mg by mouth every 6 (six) hours as needed for nausea or vomiting.  09/12/17   Jerrol Banana., MD  RESTASIS 0.05 % ophthalmic emulsion Place 1 drop into both eyes 2 (two) times daily.  07/12/19   [provider]  TRULICITY 6.83 MH/9.6QI SOPN INJECT 0.75 MG INTO THE SKIN ONCE A WEEK. 01/11/20   Jerrol Banana., MD    Allergies Latex, Butorphanol, and Lactose intolerance (gi)  Family Hx Family History  Problem Relation Age of Onset  . Migraines Mother   . Cancer Maternal Aunt        lung  . Cancer Maternal Grandmother        breast  . Diabetes Maternal Grandmother   . Breast cancer Maternal Grandmother 2058-11-06  . Cancer Father        died age 14 with metastatic cancer, uncertain origin  . Hypertension Brother   . Breast cancer Cousin 69       maternal  . Breast cancer Cousin 60       maternal    Social Hx Social History   Tobacco Use  . Smoking status: Never Smoker  . Smokeless tobacco: Never Used  Vaping Use  . Vaping Use: Never used  Substance Use Topics  . Alcohol use: Yes    Alcohol/week: 2.0 standard drinks    Types: 1 Glasses of wine, 1 Shots of liquor per week    Comment: average varies, possibly once monthly  . Drug use: No     Review of Systems  Constitutional: Negative for fever. Negative for chills. Eyes: Negative for visual changes. ENT: Negative for sore throat. Cardiovascular: Negative for chest pain. Respiratory: Negative for shortness of breath. Gastrointestinal: + vomiting, diarrhea, abdominal pain Genitourinary: Negative for dysuria. Musculoskeletal: Negative for leg swelling. Skin: Negative for rash. Neurological: Negative for headaches.   Physical  Exam  Vital Signs: ED Triage Vitals  Enc Vitals Group     BP 01/18/20 0855 140/87     Pulse Rate 01/18/20 0855 (!) 106     Resp 01/18/20 0855 16     Temp 01/18/20 0855 97.6 F (36.4 C)  Temp Source 01/18/20 0855 Oral     SpO2 01/18/20 0855 96 %     Weight --      Height --      Head Circumference --      Peak Flow --      Pain Score 01/18/20 0854 6     Pain Loc --      Pain Edu? --      Excl. in [place]? --     Constitutional: Alert and oriented.  Appears fatigued, but otherwise in NAD.  Head: Normocephalic. Atraumatic. Eyes: Conjunctivae clear. Sclera anicteric. Pupils equal and symmetric. Nose: No masses or lesions. No congestion or rhinorrhea. Mouth/Throat: Wearing mask.  Neck: No stridor. Trachea midline.  Cardiovascular: Normal rate, regular rhythm. Extremities well perfused. Respiratory: Normal respiratory effort.  Lungs CTAB. Gastrointestinal: Soft. Non-distended.  Mild tenderness to palpation across the lower abdomen.  No rebound/guarding/rigidity. Genitourinary: Deferred. Musculoskeletal: No lower extremity edema. No deformities. Neurologic:  Normal speech and language. No gross focal or lateralizing neurologic deficits are appreciated.  Skin: Skin is warm, dry and intact. No rash noted. Psychiatric: Mood and affect are appropriate for situation.    Radiology  Personally reviewed available imaging myself.   CT A/P - IMPRESSION:  1. Marked colonic thickening and pericolonic stranding with trace  pelvic fluid. Findings are consistent with colitis, potentially  infectious or inflammatory based on distribution, correlate with any  recent antibiotic administration or risk for C difficile colitis.  2. Small area of calcification intimately associated with small  bowel loops in the RIGHT lower quadrant. This area measures  approximately 10 x 5 mm. This may represent a small calcified lymph  node, perhaps related to prior inflammation. Clinical significance  is  doubtful but uncertain. Based on very close association with  small-bowel loops a associated lesion or calcification with Meckel's  diverticulum is also considered. Suggest CT enterography on  follow-up for further evaluation on a nonemergent basis.  3. Mild distension of distal small bowel loops perhaps mild focal  ileus adjacent to inflamed colon.  4. Trace ascites.  5. IUD within the uterus.  6. Small umbilical hernia containing fat.    Procedures  Procedure(s) performed (including critical care):  Procedures   Initial Impression / Assessment and Plan / MDM / ED Course  54 y.o. female who presents to the ED for nausea, vomiting, diarrhea, lower abdominal pain after eating out last night.  Ddx: food poisoning, enteritis, colitis, diverticulitis  Will plan for labs, imaging, IV fluids and symptom control and reassess  Labs reveal leukocytosis to 17.  CT scan consistent with colitis.  In the setting of the severity of her symptoms, progression to bloody stool, and leukocytosis, will opt to treat with antibiotics.  Counseled patient on the risk/benefits of this, she is agreeable with treatment.  Will give first dose of Flagyl and Cipro here and by mouth challenge.  Additionally updated patient on other incidental findings on CT as above, including the need for follow-up with her PCP about this.  She voices understanding and is comfortable with the plan and discharge.  Advised PCP follow-up and given return precautions.   _______________________________   As part of my medical decision making I have reviewed available labs, radiology tests, reviewed old records/performed chart review.    Final Clinical Impression(s) / ED Diagnosis  Final diagnoses:  Nausea, vomiting, and diarrhea  Lower abdominal pain  Colitis       Note:  This document was prepared using Dragon  voice recognition software and may include unintentional dictation errors.   Lilia Pro., MD 01/18/20  [phone]

## 2020-01-18 NOTE — ED Notes (Signed)
E-signature not working at this time. Pt verbalized understanding of D/C instructions, prescriptions and follow up care with no further questions at this time. Pt in NAD and ambulatory at time of D/C.  

## 2020-01-18 NOTE — Discharge Instructions (Addendum)
Thank you for letting us take care of you in the emergency department today.   Please continue to take any regular, prescribed medications.   New medications we have prescribed:  Cipro - antibiotic Flagyl - antibiotic  Please follow up with: Your primary care doctor to review your ER visit and follow up on your symptoms.   Please return to the ER for any new or worsening symptoms.   The CT results we discussed, follow up with your regular doctor about these: "Small area of calcification intimately associated with small bowel loops in the RIGHT lower quadrant. This area measures approximately 10 x 5 mm. This may represent a small calcified lymph node, perhaps related to prior inflammation. Clinical significance is doubtful but uncertain. Based on very close association with small-bowel loops a associated lesion or calcification with Meckel's diverticulum is also considered. Suggest CT enterography on follow-up for further evaluation on a nonemergent basis."

## 2020-01-18 NOTE — ED Notes (Signed)
Pt with c/o of NVD "all night". Pt also with c/o of abdominal pain that started last night.

## 2020-01-22 ENCOUNTER — Inpatient Hospital Stay
Admission: AD | Admit: 2020-01-22 | Discharge: 2020-01-24 | DRG: 392 | Disposition: A | Discharge status: Home / Self Care | Payer: 59 | Attending: Internal Medicine | Admitting: Internal Medicine

## 2020-01-22 ENCOUNTER — Other Ambulatory Visit: Payer: Self-pay

## 2020-01-22 DIAGNOSIS — Z91040 Latex allergy status: Secondary | ICD-10-CM

## 2020-01-22 DIAGNOSIS — Z7984 Long term (current) use of oral hypoglycemic drugs: Secondary | ICD-10-CM

## 2020-01-22 DIAGNOSIS — Z833 Family history of diabetes mellitus: Secondary | ICD-10-CM

## 2020-01-22 DIAGNOSIS — I10 Essential (primary) hypertension: Secondary | ICD-10-CM

## 2020-01-22 DIAGNOSIS — R531 Weakness: Secondary | ICD-10-CM

## 2020-01-22 DIAGNOSIS — Z79899 Other long term (current) drug therapy: Secondary | ICD-10-CM

## 2020-01-22 DIAGNOSIS — Z8249 Family history of ischemic heart disease and other diseases of the circulatory system: Secondary | ICD-10-CM

## 2020-01-22 DIAGNOSIS — E119 Type 2 diabetes mellitus without complications: Secondary | ICD-10-CM

## 2020-01-22 DIAGNOSIS — I252 Old myocardial infarction: Secondary | ICD-10-CM

## 2020-01-22 DIAGNOSIS — Z7982 Long term (current) use of aspirin: Secondary | ICD-10-CM

## 2020-01-22 DIAGNOSIS — Z7952 Long term (current) use of systemic steroids: Secondary | ICD-10-CM

## 2020-01-22 DIAGNOSIS — Z20822 Contact with and (suspected) exposure to covid-19: Secondary | ICD-10-CM | POA: Diagnosis present

## 2020-01-22 DIAGNOSIS — G894 Chronic pain syndrome: Secondary | ICD-10-CM | POA: Diagnosis present

## 2020-01-22 DIAGNOSIS — A059 Bacterial foodborne intoxication, unspecified: Secondary | ICD-10-CM | POA: Diagnosis not present

## 2020-01-22 DIAGNOSIS — A049 Bacterial intestinal infection, unspecified: Secondary | ICD-10-CM

## 2020-01-22 DIAGNOSIS — G43909 Migraine, unspecified, not intractable, without status migrainosus: Secondary | ICD-10-CM | POA: Diagnosis present

## 2020-01-22 DIAGNOSIS — Z888 Allergy status to other drugs, medicaments and biological substances status: Secondary | ICD-10-CM

## 2020-01-22 LAB — BASIC METABOLIC PANEL
Anion gap: 10 (ref 5–15)
BUN: 14 mg/dL (ref 6–20)
CO2: 24 mmol/L (ref 22–32)
Calcium: 9.4 mg/dL (ref 8.9–10.3)
Chloride: 104 mmol/L (ref 98–111)
Creatinine, Ser: 0.86 mg/dL (ref 0.44–1.00)
GFR calc Af Amer: >60 mL/min (ref >60)
GFR calc non Af Amer: >60 mL/min (ref >60)
Glucose, Bld: 213 mg/dL — ABNORMAL HIGH (ref 70–99)
Potassium: 4.1 mmol/L (ref 3.5–5.1)
Sodium: 138 mmol/L (ref 135–145)

## 2020-01-22 LAB — CBC
HCT: 38.6 % (ref 36.0–46.0)
Hemoglobin: 13 g/dL (ref 12.0–15.0)
MCH: 28.1 pg (ref 26.0–34.0)
MCHC: 33.7 g/dL (ref 30.0–36.0)
MCV: 83.5 fL (ref 80.0–100.0)
Platelets: 240 10*3/uL (ref 150–400)
RBC: 4.62 MIL/uL (ref 3.87–5.11)
RDW: 13.2 % (ref 11.5–15.5)
WBC: 4.9 10*3/uL (ref 4.0–10.5)
nRBC: 0 % (ref 0.0–0.2)

## 2020-01-22 LAB — MAGNESIUM: Magnesium: 1.4 mg/dL — ABNORMAL LOW (ref 1.7–2.4)

## 2020-01-22 MED ORDER — SODIUM CHLORIDE 0.9 % IV BOLUS
1000.0000 mL | Freq: Once | INTRAVENOUS | Status: AC
  Administered 2020-01-22: 1000 mL via INTRAVENOUS

## 2020-01-22 MED ORDER — SODIUM CHLORIDE 0.9% FLUSH
3.0000 mL | Freq: Once | INTRAVENOUS | Status: AC
  Administered 2020-01-23: 3 mL via INTRAVENOUS

## 2020-01-22 NOTE — ED Triage Notes (Signed)
Pt arrives from [place] [facility] for c/o symptoms of dehydration. Pt seen here Friday for bloody diarrhea and vomiting. Pt discharged with flagyl and cipro and reports she felt better over the weekend. Pt here today for possible electrolyte imbalance due to frequent diarrhea, weakness and decreased appetite. Pt reports abdominal cramping as well. Skin warm and dry.

## 2020-01-22 NOTE — ED Provider Notes (Signed)
[facility] Emergency Department Provider Note  ____________________________________________   First MD Initiated Contact with Patient 01/22/20 2309     (approximate)  I have reviewed the triage vital signs and the nursing notes.   HISTORY  Chief Complaint Weakness    HPI Judith Fox is a 54 y.o. female with below list of previous medical conditions including recent emergency department visit secondary to lower abdominal discomfort nausea vomiting and bloody diarrhea on 01/18/2020 returns to the emergency department secondary to continued diarrhea that has remained bloody in nature as well as feelings of dehydration.  Patient admits to profound fatigue inability to tolerate p.o.  With marked decrease in appetite.  Patient states that symptoms began on 01/18/2020 after eating out at a restaurant.  Patient states that her husband had similar symptoms which have resolved however her status continued to be persistent.  Patient states that current pain score 6 out of 10.        Past Medical History:  Diagnosis Date  . Allergy   . Arthritis    KNEE  . Asthma    "mild" per pt  . Complication of anesthesia    PT HAD A MI AFTER KNEE SURGERY IN 2014 FROM HIGH BLOOD PRESSURE-SURGERY WAS DONE AT AN OUTPATIENT SURGERY CENTER  . Diabetes mellitus without complication ([place])   . History of hiatal hernia   . History of kidney stones   . Hypertension   . Migraine   . Myocardial infarction ([place]) 2014   no stents  . Pneumonia    h/o    Patient Active Problem List   Diagnosis Date Noted  . Generalized weakness 01/23/2020  . Hypomagnesemia 01/23/2020  . Intestinal infection due to bacteria causing bloody diarrhea 01/23/2020  . IUD (intrauterine device) in place 10/25/2019  . Cyst of left ovary 10/25/2019  . Acute left-sided low back pain without sciatica 10/24/2019  . Left lower quadrant abdominal pain 10/23/2019  . Simple chronic bronchitis ([place])  09/26/2019  . Varicose veins of both lower extremities with pain 05/16/2018  . Bilateral lower extremity edema 05/16/2018  . Mixed hyperlipidemia 09/05/2017  . Anxiety 06/26/2014  . Syncope and collapse 07/30/2013  . Palpitations 03/11/2013  . Type 2 diabetes mellitus without complication ([place]) 88/28/0034  . History of non-ST elevation myocardial infarction (NSTEMI) 03/01/2013  . HTN (hypertension) 03/01/2013  . 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  . Chronic pain syndrome 12/27/2011  . 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 Chronic migraine without aura 10/12/2011  . Migraine without aura 10/12/2011    Past Surgical History:  Procedure Laterality Date  . ANAL FISSURE REPAIR    . INTRAUTERINE DEVICE (IUD) INSERTION N/A 11/02/2019   Procedure: INTRAUTERINE DEVICE (IUD) INSERTION - LILETTA;  Surgeon: Benjaman Kindler, MD;  Location: [facility] ORS;  Service: Gynecology;  Laterality: N/A;  . IUD REMOVAL N/A 11/02/2019   Procedure: INTRAUTERINE DEVICE (IUD) REMOVAL- MIRENA;  Surgeon: Benjaman Kindler, MD;  Location: [facility] ORS;  Service: Gynecology;  Laterality: N/A;  . JOINT REPLACEMENT Right    knee replacement  . KNEE ARTHROSCOPY    . LAPAROSCOPIC OVARIAN CYSTECTOMY Right 11/02/2019   Procedure: LAPAROSCOPIC PERITUBALCYSTECTOMY;  Surgeon: Benjaman Kindler, MD;  Location: [facility] ORS;  Service: Gynecology;  Laterality: Right;  . LAPAROSCOPIC SALPINGO OOPHERECTOMY Left 11/02/2019   Procedure: LAPAROSCOPIC OOPHO;  Surgeon: Benjaman Kindler, MD;  Location: [facility] ORS;  Service: Gynecology;  Laterality: Left;  . LAPAROSCOPY N/A 11/02/2019   Procedure: LAPAROSCOPY DIAGNOSTIC WITH PELVIC WASHINGS;  Surgeon:  Benjaman Kindler, MD;  Location: [facility] ORS;  Service: Gynecology;  Laterality: N/A;  . TONSILLECTOMY    . WISDOM TOOTH EXTRACTION      Prior to Admission medications   Medication Sig Start Date End Date Taking? Authorizing Provider  aspirin EC 81 MG tablet Take 81 mg by mouth daily.    [provider]  atorvastatin (LIPITOR) 40 MG tablet TAKE 1 TABLET BY MOUTH EVERY DAY Patient taking differently: Take 40 mg by mouth every evening.  07/12/19   Jerrol Banana., MD  benazepril (LOTENSIN) 40 MG tablet TAKE 1 TABLET BY MOUTH EVERY DAY Patient taking differently: Take 40 mg by mouth every morning.  07/26/19   Jerrol Banana., MD  CALCIUM-VITAMIN D PO Take 1 tablet by mouth daily.    [provider]  ciprofloxacin (CIPRO) 500 MG tablet Take 1 tablet (500 mg total) by mouth 2 (two) times daily for 10 days. 01/18/20 01/28/20  Lilia Pro., MD  diphenhydrAMINE (BENADRYL) 50 MG/ML injection Inject 50 mg into the muscle daily as needed (migraine).    [provider]  docusate sodium (COLACE) 100 MG capsule Take 1 capsule (100 mg total) by mouth 2 (two) times daily. To keep stools soft 11/02/19   Benjaman Kindler, MD  doxycycline (PERIOSTAT) 20 MG tablet TAKE 1 TABLET BY MOUTH TWICE A DAY 01/05/20   Jerrol Banana., MD  fexofenadine (ALLEGRA) 180 MG tablet Take 180 mg by mouth in the morning.     [provider]  fluconazole (DIFLUCAN) 150 MG tablet Take 150 mg by mouth once a week. And prn    [provider]  gabapentin (NEURONTIN) 800 MG tablet Take 1 tablet (800 mg total) by mouth at bedtime for 14 days. Take nightly for 3 days, then up to 14 days as needed Patient not taking: Reported on 12/13/2019 11/02/19 11/16/19  Benjaman Kindler, MD  glipiZIDE (GLUCOTROL) 10 MG tablet TAKE 1 TABLET (10 MG TOTAL) BY MOUTH DAILY BEFORE BREAKFAST. 07/12/19   Jerrol Banana., MD  ketorolac (TORADOL) 10 MG tablet Take 1 tablet (10 mg total) by mouth every 8 (eight) hours as needed for moderate pain (with food). Patient taking differently: Take 10 mg by mouth every 8 (eight) hours as needed for moderate pain (with food).  10/04/17   Eula Listen, MD  ketorolac (TORADOL) 30 MG/ML injection Inject 30 mg into the muscle daily as needed (migrain).     [provider]  levonorgestrel (MIRENA) 20 MCG/24HR IUD 1 each by Intrauterine route once.    [provider]  metFORMIN (GLUCOPHAGE) 1000 MG tablet TAKE 1 TABLET BY MOUTH 2 TIMES DAILY WITH A MEAL. Patient taking differently: Take 1,000 mg by mouth 2 (two) times daily with a meal.  07/12/19   Jerrol Banana., MD  metoCLOPramide [facility]) 5 MG/ML injection Inject 10 mg into the vein daily as needed (migraine).     [provider]  metoCLOPramide (REGLAN) 5 MG/ML injection Inject 5 mg into the muscle daily as needed (migraine).    [provider]  metroNIDAZOLE (FLAGYL) 500 MG tablet Take 1 tablet (500 mg total) by mouth 3 (three) times daily for 10 days. 01/18/20 01/28/20  Lilia Pro., MD  Multiple Vitamin (MULTIVITAMIN) capsule Take 1 capsule by mouth daily.    [provider]  naproxen (NAPROSYN) 500 MG tablet Take 1 tablet (500 mg total) by mouth 2 (two) times daily as needed. Patient taking differently:  Take 500 mg by mouth 2 (two) times daily with a meal.  04/15/19   Jerrol Banana., MD  nitroGLYCERIN (NITROSTAT) 0.4 MG SL tablet Place 1 tablet (0.4 mg total) under the tongue every 5 (five) minutes as needed for chest pain. Patient taking differently: Place 0.4 mg under the tongue every 5 (five) minutes as needed for chest pain.  08/22/17   Jerrol Banana., MD  omeprazole (PRILOSEC) 20 MG capsule TAKE 1 CAPSULE BY MOUTH 2 TIMES DAILY BEFORE A MEAL. Patient taking differently: Take 20 mg by mouth 2 (two) times daily before a meal.  07/12/19   Jerrol Banana., MD  ondansetron (ZOFRAN ODT) 4 MG disintegrating tablet Take 1 tablet (4 mg total) by mouth every 8 (eight) hours as needed for nausea or vomiting. 01/18/20   Carrie Mew, MD  oxyCODONE (OXY IR/ROXICODONE) 5 MG immediate release tablet Take 1 tablet (5 mg total) by mouth every 4 (four) hours as needed for severe pain. Patient not taking: Reported on 12/13/2019 11/02/19    Benjaman Kindler, MD  predniSONE (DELTASONE) 10 MG tablet Take 6 Tablets Day 1, 5 Tablets Day 2, 4 Tablets Day 3, 3 Tablets Day 4, 2 Tablets Day 5, 1 Tablet Day 6 12/13/19   Jerrol Banana., MD  prochlorperazine (COMPAZINE) 10 MG tablet Take 1 tablet (10 mg total) by mouth every 6 (six) hours as needed for nausea or vomiting (migraine). Patient taking differently: Take 10 mg by mouth every 6 (six) hours as needed for nausea or vomiting (migraine).  10/04/17   Eula Listen, MD  promethazine (PHENERGAN) 25 MG tablet Take 1 tablet (25 mg total) by mouth every 6 (six) hours as needed for nausea or vomiting. Patient taking differently: Take 25 mg by mouth every 6 (six) hours as needed for nausea or vomiting.  09/12/17   Jerrol Banana., MD  RESTASIS 0.05 % ophthalmic emulsion Place 1 drop into both eyes 2 (two) times daily.  07/12/19   [provider]  TRULICITY 1.96 QI/2.9NL SOPN INJECT 0.75 MG INTO THE SKIN ONCE A WEEK. 01/11/20   Jerrol Banana., MD    Allergies Latex, Butorphanol, and Lactose intolerance (gi)  Family History  Problem Relation Age of Onset  . Migraines Mother   . Cancer Maternal Aunt        lung  . Cancer Maternal Grandmother        breast  . Diabetes Maternal Grandmother   . Breast cancer Maternal Grandmother 11/23/2058  . Cancer Father        died age 71 with metastatic cancer, uncertain origin  . Hypertension Brother   . Breast cancer Cousin 9       maternal  . Breast cancer Cousin 44       maternal    Social History Social History   Tobacco Use  . Smoking status: Never Smoker  . Smokeless tobacco: Never Used  Vaping Use  . Vaping Use: Never used  Substance Use Topics  . Alcohol use: Yes    Alcohol/week: 2.0 standard drinks    Types: 1 Glasses of wine, 1 Shots of liquor per week    Comment: average varies, possibly once monthly  . Drug use: No    Review of Systems Constitutional: No fever/chills Eyes: No visual  changes. ENT: No sore throat. Cardiovascular: Denies chest pain. Respiratory: Denies shortness of breath. Gastrointestinal: As of abdominal pain nausea vomiting and diarrhea Genitourinary: Negative for dysuria.  Musculoskeletal: Negative for neck pain.  Negative for back pain. Integumentary: Negative for rash. Neurological: Negative for headaches, focal weakness or numbness.  ____________________________________________   PHYSICAL EXAM:  VITAL SIGNS: ED Triage Vitals  Enc Vitals Group     BP 01/22/20 1738 (!) 148/93     Pulse Rate 01/22/20 1738 (!) 105     Resp 01/22/20 1738 18     Temp 01/22/20 1738 98.9 F (37.2 C)     Temp Source 01/22/20 1738 Oral     SpO2 01/22/20 1738 99 %     Weight 01/22/20 1740 81.6 kg (180 lb)     Height 01/22/20 1740 1.753 m (5\' 9" )     Head Circumference --      Peak Flow --      Pain Score 01/22/20 1739 6     Pain Loc --      Pain Edu? --      Excl. in [place]? --     Constitutional: Alert and oriented.  Eyes: Conjunctivae are normal.  Mouth/Throat: Patient is wearing a mask. Neck: No stridor.  No meningeal signs.   Cardiovascular: Normal rate, regular rhythm. Good peripheral circulation. Grossly normal heart sounds. Respiratory: Normal respiratory effort.  No retractions. Gastrointestinal: Left lower quadrant tenderness to palpation.. No distention.  Musculoskeletal: No lower extremity tenderness nor edema. No gross deformities of extremities. Neurologic:  Normal speech and language. No gross focal neurologic deficits are appreciated.  Skin:  Skin is warm, dry and intact. Psychiatric: Mood and affect are normal. Speech and behavior are normal.  ____________________________________________   LABS (all labs ordered are listed, but only abnormal results are displayed)  Labs Reviewed  BASIC METABOLIC PANEL - Abnormal; Notable for the following components:      Result Value   Glucose, Bld 213 (*)    All other components within normal limits   URINALYSIS, COMPLETE (UACMP) WITH MICROSCOPIC - Abnormal; Notable for the following components:   Color, Urine YELLOW (*)    APPearance CLEAR (*)    Leukocytes,Ua SMALL (*)    All other components within normal limits  MAGNESIUM - Abnormal; Notable for the following components:   Magnesium 1.4 (*)    All other components within normal limits  GASTROINTESTINAL PANEL BY PCR, STOOL (REPLACES STOOL CULTURE)  C DIFFICILE QUICK SCREEN W PCR REFLEX  CBC  HIV ANTIBODY (ROUTINE TESTING W REFLEX)  CBC  CREATININE, SERUM  CBG MONITORING, ED  POC URINE PREG, ED   ____________________________________________  EKG  ED ECG REPORT I, Edmondson N Dhruv Christina, the attending physician, personally viewed and interpreted this ECG.   Date: 01/22/2020  EKG Time: 5:49 PM  Rate: 98  Rhythm: Normal sinus rhythm  Axis: Normal  Intervals: Normal  ST&T Change: None       Procedures   ____________________________________________   INITIAL IMPRESSION / MDM / ASSESSMENT AND PLAN / ED COURSE  As part of my medical decision making, I reviewed the following data within the electronic MEDICAL RECORD NUMBER   54 year old female presented with above-stated history and physical exam a differential diagnosis including but not limited to infectious colitis based on CT scan that was performed on 01/18/2020 concern for possible C. difficile versus other infectious etiology. As such stool cultures including C. difficile ordered however patient had no further diarrhea while in the emergency department. Patient given 2 L IV normal saline magnesium level noted to be low and a such patient was given 1 g of IV magnesium. On reevaluation patient  admits to feeling improved however states that she is unable to tolerate p.o. and continue to generalized weakness. As such patient discussed with hospitalist for admission for further evaluation and management. ____________________________________________  FINAL CLINICAL  IMPRESSION(S) / ED DIAGNOSES  Final diagnoses:  Intestinal infection due to bacteria causing bloody diarrhea     MEDICATIONS GIVEN DURING THIS VISIT:  Medications  enoxaparin (LOVENOX) injection 40 mg (has no administration in time range)  0.9 %  sodium chloride infusion (has no administration in time range)  acetaminophen (TYLENOL) tablet 650 mg (has no administration in time range)    Or  acetaminophen (TYLENOL) suppository 650 mg (has no administration in time range)  ondansetron (ZOFRAN) tablet 4 mg (has no administration in time range)    Or  ondansetron (ZOFRAN) injection 4 mg (has no administration in time range)  insulin aspart (novoLOG) injection 0-15 Units (has no administration in time range)  insulin aspart (novoLOG) injection 0-5 Units (has no administration in time range)  ciprofloxacin (CIPRO) IVPB 400 mg (has no administration in time range)  metroNIDAZOLE (FLAGYL) IVPB 500 mg (has no administration in time range)  sodium chloride flush (NS) 0.9 % injection 3 mL (3 mLs Intravenous Given 01/23/20 0007)  sodium chloride 0.9 % bolus 1,000 mL (0 mLs Intravenous Stopped 01/23/20 0254)  sodium chloride 0.9 % bolus 1,000 mL (0 mLs Intravenous Stopped 01/23/20 0254)  magnesium sulfate IVPB 1 g 100 mL (0 g Intravenous Stopped 01/23/20 0254)     ED Discharge Orders    None      *Please note:  Judith Fox was evaluated in Emergency Department on 01/23/2020 for the symptoms described in the history of present illness. She was evaluated in the context of the global COVID-19 pandemic, which necessitated consideration that the patient might be at risk for infection with the SARS-CoV-2 virus that causes COVID-19. Institutional protocols and algorithms that pertain to the evaluation of patients at risk for COVID-19 are in a state of rapid change based on information released by regulatory bodies including the CDC and federal and state organizations. These policies and  algorithms were followed during the patient's care in the ED.  Some ED evaluations and interventions may be delayed as a result of limited staffing during the pandemic.*  Note:  This document was prepared using Dragon voice recognition software and may include unintentional dictation errors.   Gregor Hams, MD 01/23/20 [phone]

## 2020-01-22 NOTE — ED Triage Notes (Signed)
Pt in via [facility] with c/o abd pan and decrease appetite. [place] reports concerns for electrolytes, states pt was seen here for same recently

## 2020-01-23 ENCOUNTER — Other Ambulatory Visit: Payer: Self-pay

## 2020-01-23 DIAGNOSIS — Z8249 Family history of ischemic heart disease and other diseases of the circulatory system: Secondary | ICD-10-CM | POA: Diagnosis not present

## 2020-01-23 DIAGNOSIS — Z7984 Long term (current) use of oral hypoglycemic drugs: Secondary | ICD-10-CM | POA: Diagnosis not present

## 2020-01-23 DIAGNOSIS — Z7982 Long term (current) use of aspirin: Secondary | ICD-10-CM | POA: Diagnosis not present

## 2020-01-23 DIAGNOSIS — I10 Essential (primary) hypertension: Secondary | ICD-10-CM | POA: Diagnosis present

## 2020-01-23 DIAGNOSIS — A049 Bacterial intestinal infection, unspecified: Secondary | ICD-10-CM

## 2020-01-23 DIAGNOSIS — I252 Old myocardial infarction: Secondary | ICD-10-CM | POA: Diagnosis not present

## 2020-01-23 DIAGNOSIS — Z888 Allergy status to other drugs, medicaments and biological substances status: Secondary | ICD-10-CM | POA: Diagnosis not present

## 2020-01-23 DIAGNOSIS — A059 Bacterial foodborne intoxication, unspecified: Secondary | ICD-10-CM | POA: Diagnosis present

## 2020-01-23 DIAGNOSIS — Z91040 Latex allergy status: Secondary | ICD-10-CM | POA: Diagnosis not present

## 2020-01-23 DIAGNOSIS — Z20822 Contact with and (suspected) exposure to covid-19: Secondary | ICD-10-CM | POA: Diagnosis present

## 2020-01-23 DIAGNOSIS — Z833 Family history of diabetes mellitus: Secondary | ICD-10-CM | POA: Diagnosis not present

## 2020-01-23 DIAGNOSIS — E119 Type 2 diabetes mellitus without complications: Secondary | ICD-10-CM | POA: Diagnosis present

## 2020-01-23 DIAGNOSIS — R531 Weakness: Secondary | ICD-10-CM

## 2020-01-23 DIAGNOSIS — Z79899 Other long term (current) drug therapy: Secondary | ICD-10-CM | POA: Diagnosis not present

## 2020-01-23 DIAGNOSIS — G894 Chronic pain syndrome: Secondary | ICD-10-CM | POA: Diagnosis present

## 2020-01-23 DIAGNOSIS — G43909 Migraine, unspecified, not intractable, without status migrainosus: Secondary | ICD-10-CM | POA: Diagnosis present

## 2020-01-23 DIAGNOSIS — Z7952 Long term (current) use of systemic steroids: Secondary | ICD-10-CM | POA: Diagnosis not present

## 2020-01-23 LAB — URINALYSIS, COMPLETE (UACMP) WITH MICROSCOPIC
Bacteria, UA: NONE SEEN
Bilirubin Urine: NEGATIVE
Glucose, UA: NEGATIVE mg/dL
Hgb urine dipstick: NEGATIVE
Ketones, ur: NEGATIVE mg/dL
Nitrite: NEGATIVE
Protein, ur: NEGATIVE mg/dL
Specific Gravity, Urine: 1.01 (ref 1.005–1.030)
pH: 5 (ref 5.0–8.0)

## 2020-01-23 LAB — GLUCOSE, CAPILLARY
Glucose-Capillary: 206 mg/dL — ABNORMAL HIGH (ref 70–99)
Glucose-Capillary: 180 mg/dL — ABNORMAL HIGH (ref 70–99)
Glucose-Capillary: 234 mg/dL — ABNORMAL HIGH (ref 70–99)
Glucose-Capillary: 141 mg/dL — ABNORMAL HIGH (ref 70–99)

## 2020-01-23 LAB — CREATININE, SERUM
Creatinine, Ser: 0.84 mg/dL (ref 0.44–1.00)
GFR calc Af Amer: >60 mL/min (ref >60)
GFR calc non Af Amer: >60 mL/min (ref >60)

## 2020-01-23 LAB — CBC
HCT: 34.2 % — ABNORMAL LOW (ref 36.0–46.0)
Hemoglobin: 11.5 g/dL — ABNORMAL LOW (ref 12.0–15.0)
MCH: 28.2 pg (ref 26.0–34.0)
MCHC: 33.6 g/dL (ref 30.0–36.0)
MCV: 83.8 fL (ref 80.0–100.0)
Platelets: 201 10*3/uL (ref 150–400)
RBC: 4.08 MIL/uL (ref 3.87–5.11)
RDW: 13.3 % (ref 11.5–15.5)
WBC: 4.9 10*3/uL (ref 4.0–10.5)
nRBC: 0 % (ref 0.0–0.2)

## 2020-01-23 LAB — SARS CORONAVIRUS 2 BY RT PCR (HOSPITAL ORDER, PERFORMED IN ~~LOC~~ HOSPITAL LAB): SARS Coronavirus 2: NEGATIVE

## 2020-01-23 LAB — HIV ANTIBODY (ROUTINE TESTING W REFLEX): HIV Screen 4th Generation wRfx: NONREACTIVE

## 2020-01-23 MED ORDER — CYCLOSPORINE 0.05 % OP EMUL
1.0000 [drp] | Freq: Two times a day (BID) | OPHTHALMIC | Status: DC
  Administered 2020-01-23 – 2020-01-24 (×2): 1 [drp] via OPHTHALMIC
  Filled 2020-01-23 (×3): qty 1

## 2020-01-23 MED ORDER — CALCIUM CARBONATE-VITAMIN D 500-200 MG-UNIT PO TABS
1.0000 | ORAL_TABLET | Freq: Every day | ORAL | Status: DC
  Administered 2020-01-23 – 2020-01-24 (×2): 1 via ORAL
  Filled 2020-01-23 (×2): qty 1

## 2020-01-23 MED ORDER — SODIUM CHLORIDE 0.9 % IV SOLN: INTRAVENOUS | Status: DC

## 2020-01-23 MED ORDER — ONDANSETRON HCL 4 MG PO TABS: 4.0000 mg | ORAL_TABLET | Freq: Four times a day (QID) | ORAL | Status: DC | PRN

## 2020-01-23 MED ORDER — CIPROFLOXACIN IN D5W 400 MG/200ML IV SOLN
400.0000 mg | Freq: Two times a day (BID) | INTRAVENOUS | Status: DC
  Administered 2020-01-23 – 2020-01-24 (×3): 400 mg via INTRAVENOUS
  Filled 2020-01-23 (×4): qty 200

## 2020-01-23 MED ORDER — MAGNESIUM SULFATE 4 GM/100ML IV SOLN
4.0000 g | Freq: Once | INTRAVENOUS | Status: AC
  Administered 2020-01-23: 4 g via INTRAVENOUS
  Filled 2020-01-23 (×2): qty 100

## 2020-01-23 MED ORDER — OXYCODONE HCL 5 MG PO TABS
5.0000 mg | ORAL_TABLET | ORAL | Status: DC | PRN
  Administered 2020-01-24: 5 mg via ORAL
  Filled 2020-01-23: qty 1

## 2020-01-23 MED ORDER — INSULIN ASPART 100 UNIT/ML ~~LOC~~ SOLN
0.0000 [IU] | Freq: Three times a day (TID) | SUBCUTANEOUS | Status: DC
  Administered 2020-01-23: 3 [IU] via SUBCUTANEOUS
  Administered 2020-01-23 – 2020-01-24 (×4): 5 [IU] via SUBCUTANEOUS
  Filled 2020-01-23 (×5): qty 1

## 2020-01-23 MED ORDER — ACETAMINOPHEN 325 MG PO TABS
650.0000 mg | ORAL_TABLET | Freq: Four times a day (QID) | ORAL | Status: DC | PRN
  Administered 2020-01-23 (×2): 650 mg via ORAL
  Filled 2020-01-23 (×2): qty 2

## 2020-01-23 MED ORDER — ENOXAPARIN SODIUM 40 MG/0.4ML ~~LOC~~ SOLN
40.0000 mg | SUBCUTANEOUS | Status: DC
  Administered 2020-01-23 – 2020-01-24 (×2): 40 mg via SUBCUTANEOUS
  Filled 2020-01-23 (×2): qty 0.4

## 2020-01-23 MED ORDER — ADULT MULTIVITAMIN W/MINERALS CH
1.0000 | ORAL_TABLET | Freq: Every day | ORAL | Status: DC
  Administered 2020-01-23 – 2020-01-24 (×2): 1 via ORAL
  Filled 2020-01-23 (×2): qty 1

## 2020-01-23 MED ORDER — INSULIN ASPART 100 UNIT/ML ~~LOC~~ SOLN: 0.0000 [IU] | Freq: Every day | SUBCUTANEOUS | Status: DC

## 2020-01-23 MED ORDER — ONDANSETRON HCL 4 MG/2ML IJ SOLN
4.0000 mg | Freq: Four times a day (QID) | INTRAMUSCULAR | Status: DC | PRN
  Administered 2020-01-24 (×2): 4 mg via INTRAVENOUS
  Filled 2020-01-23 (×2): qty 2

## 2020-01-23 MED ORDER — METRONIDAZOLE IN NACL 5-0.79 MG/ML-% IV SOLN
500.0000 mg | Freq: Three times a day (TID) | INTRAVENOUS | Status: DC
  Administered 2020-01-23 (×2): 500 mg via INTRAVENOUS
  Filled 2020-01-23 (×3): qty 100

## 2020-01-23 MED ORDER — ATORVASTATIN CALCIUM 20 MG PO TABS
40.0000 mg | ORAL_TABLET | Freq: Every evening | ORAL | Status: DC
  Administered 2020-01-23: 40 mg via ORAL
  Filled 2020-01-23: qty 2

## 2020-01-23 MED ORDER — BENAZEPRIL HCL 20 MG PO TABS
40.0000 mg | ORAL_TABLET | ORAL | Status: DC
  Administered 2020-01-23 – 2020-01-24 (×2): 40 mg via ORAL
  Filled 2020-01-23 (×3): qty 2

## 2020-01-23 MED ORDER — ACETAMINOPHEN 650 MG RE SUPP: 650.0000 mg | Freq: Four times a day (QID) | RECTAL | Status: DC | PRN

## 2020-01-23 MED ORDER — MAGNESIUM SULFATE IN D5W 1-5 GM/100ML-% IV SOLN
1.0000 g | Freq: Once | INTRAVENOUS | Status: AC
  Administered 2020-01-23: 1 g via INTRAVENOUS
  Filled 2020-01-23: qty 100

## 2020-01-23 NOTE — ED Notes (Signed)
This RN called pharmacy asked for flagyl to be sent in tube station or restocked.

## 2020-01-23 NOTE — H&P (Signed)
History and Physical    Judith Fox VPX:[id number] DOB: 12/18/1965 DOA: 01/22/2020  PCP: Jerrol Banana., MD   Patient coming from: Home  I have personally briefly reviewed patient's old medical records in [place]  Chief Complaint: Diarrhea, bloody  HPI: Judith Fox is a 55 y.o. female with medical history significant for DM 2, HTN, chronic pain, history of MI, who presents to the emergency room for the second time in 4 days with a complaint of diarrhea with blood mixed in.  She was first seen on 01/18/2020 with a complaint of nausea vomiting diarrhea and lower abdominal pain that started a few hours after eating out at a restaurant.  Her husband had similar symptoms similar onset but not as severe.  Her white cell count on her first visit was 17,000.  She had a CT abdomen that showed findings consistent with colitis potentially infectious or inflammatory.  She was treated and discharged on Cipro and Flagyl.  She returns tonight with persistent diarrhea with blood, associated with marked generalized weakness.  Though nausea and vomiting has improved,  She has decreased oral intake and continues to have some residual left lower quadrant crampy pain, nonradiating, of mild to moderate intensity with no aggravating or alleviating factors ED Course: On arrival she was mildly tachycardic at 105, BP 148/93, with otherwise normal vitals.  WBC was 4.9 down from 17,000 during her visit 4 days prior.  Blood work was essentially otherwise unremarkable except for low magnesium of 1.4.  She was given a dose of IV magnesium.  Hospitalist consulted for admission.  Review of Systems: As per HPI otherwise 10 point review of systems negative.    Past Medical History:  Diagnosis Date  . Allergy   . Arthritis    KNEE  . Asthma    "mild" per pt  . Complication of anesthesia    PT HAD A MI AFTER KNEE SURGERY IN 2014 FROM HIGH BLOOD PRESSURE-SURGERY WAS DONE AT AN OUTPATIENT  SURGERY CENTER  . Diabetes mellitus without complication ([place])   . History of hiatal hernia   . History of kidney stones   . Hypertension   . Migraine   . Myocardial infarction ([place]) 2014   no stents  . Pneumonia    h/o    Past Surgical History:  Procedure Laterality Date  . ANAL FISSURE REPAIR    . INTRAUTERINE DEVICE (IUD) INSERTION N/A 11/02/2019   Procedure: INTRAUTERINE DEVICE (IUD) INSERTION - LILETTA;  Surgeon: Benjaman Kindler, MD;  Location: [facility] ORS;  Service: Gynecology;  Laterality: N/A;  . IUD REMOVAL N/A 11/02/2019   Procedure: INTRAUTERINE DEVICE (IUD) REMOVAL- MIRENA;  Surgeon: Benjaman Kindler, MD;  Location: [facility] OR[facility];  Service: Gynecology;  Laterality: N/A;  . JOINT REPLACEMENT Right    knee replacement  . KNEE ARTHROSCOPY    . LAPAROSCOPIC OVARIAN CYSTECTOMY Right 11/02/2019   Procedure: LAPAROSCOPIC PERITUBALCYSTECTOMY;  Surgeon: Benjaman Kindler, MD;  Location: [facility] ORS;  Service: Gynecology;  Laterality: Right;  . LAPAROSCOPIC SALPINGO OOPHERECTOMY Left 11/02/2019   Procedure: LAPAROSCOPIC OOPHO;  Surgeon: Benjaman Kindler, MD;  Location: [facility] OR[facility];  Service: Gynecology;  Laterality: Left;  . LAPAROSCOPY N/A 11/02/2019   Procedure: LAPAROSCOPY DIAGNOSTIC WITH PELVIC WASHINGS;  Surgeon: Benjaman Kindler, MD;  Location: [facility] OR[facility];  Service: Gynecology;  Laterality: N/A;  . TONSILLECTOMY    . WISDOM TOOTH EXTRACTION       reports that she has never smoked. She has never used smokeless tobacco. She reports  current alcohol use of about 2.0 standard drinks of alcohol per week. She reports that she does not use drugs.  Allergies  Allergen Reactions  . Latex Rash and Swelling    Eye swelling   . Butorphanol Other (See Comments)    drowsiness Somnolence Pt "knocked out for 12 hrs" when taking this   . Lactose Intolerance (Gi) Other (See Comments)    Reflux and Indigestion    Family History  Problem Relation Age of Onset  . Migraines Mother   . Cancer Maternal  Aunt        lung  . Cancer Maternal Grandmother        breast  . Diabetes Maternal Grandmother   . Breast cancer Maternal Grandmother 11/14/58  . Cancer Father        died age 24 with metastatic cancer, uncertain origin  . Hypertension Brother   . Breast cancer Cousin 35       maternal  . Breast cancer Cousin 50       maternal     Prior to Admission medications   Medication Sig Start Date End Date Taking? Authorizing Provider  aspirin EC 81 MG tablet Take 81 mg by mouth daily.    [provider]  atorvastatin (LIPITOR) 40 MG tablet TAKE 1 TABLET BY MOUTH EVERY DAY Patient taking differently: Take 40 mg by mouth every evening.  07/12/19   Jerrol Banana., MD  benazepril (LOTENSIN) 40 MG tablet TAKE 1 TABLET BY MOUTH EVERY DAY Patient taking differently: Take 40 mg by mouth every morning.  07/26/19   Jerrol Banana., MD  CALCIUM-VITAMIN D PO Take 1 tablet by mouth daily.    [provider]  ciprofloxacin (CIPRO) 500 MG tablet Take 1 tablet (500 mg total) by mouth 2 (two) times daily for 10 days. 01/18/20 01/28/20  Lilia Pro., MD  diphenhydrAMINE (BENADRYL) 50 MG/ML injection Inject 50 mg into the muscle daily as needed (migraine).    [provider]  docusate sodium (COLACE) 100 MG capsule Take 1 capsule (100 mg total) by mouth 2 (two) times daily. To keep stools soft 11/02/19   Benjaman Kindler, MD  doxycycline (PERIOSTAT) 20 MG tablet TAKE 1 TABLET BY MOUTH TWICE A DAY 01/05/20   Jerrol Banana., MD  fexofenadine (ALLEGRA) 180 MG tablet Take 180 mg by mouth in the morning.     [provider]  fluconazole (DIFLUCAN) 150 MG tablet Take 150 mg by mouth once a week. And prn    [provider]  gabapentin (NEURONTIN) 800 MG tablet Take 1 tablet (800 mg total) by mouth at bedtime for 14 days. Take nightly for 3 days, then up to 14 days as needed Patient not taking: Reported on 12/13/2019 11/02/19 11/16/19  Benjaman Kindler, MD    glipiZIDE (GLUCOTROL) 10 MG tablet TAKE 1 TABLET (10 MG TOTAL) BY MOUTH DAILY BEFORE BREAKFAST. 07/12/19   Jerrol Banana., MD  ketorolac (TORADOL) 10 MG tablet Take 1 tablet (10 mg total) by mouth every 8 (eight) hours as needed for moderate pain (with food). Patient taking differently: Take 10 mg by mouth every 8 (eight) hours as needed for moderate pain (with food).  10/04/17   Eula Listen, MD  ketorolac (TORADOL) 30 MG/ML injection Inject 30 mg into the muscle daily as needed (migrain).    [provider]  levonorgestrel (MIRENA) 20 MCG/24HR IUD 1 each by Intrauterine route once.    [provider]  metFORMIN (GLUCOPHAGE) 1000 MG tablet TAKE 1 TABLET BY MOUTH 2 TIMES DAILY WITH A MEAL. Patient taking differently: Take 1,000 mg by mouth 2 (two) times daily with a meal.  07/12/19   Jerrol Banana., MD  metoCLOPramide [facility]) 5 MG/ML injection Inject 10 mg into the vein daily as needed (migraine).     [provider]  metoCLOPramide (REGLAN) 5 MG/ML injection Inject 5 mg into the muscle daily as needed (migraine).    [provider]  metroNIDAZOLE (FLAGYL) 500 MG tablet Take 1 tablet (500 mg total) by mouth 3 (three) times daily for 10 days. 01/18/20 01/28/20  Lilia Pro., MD  Multiple Vitamin (MULTIVITAMIN) capsule Take 1 capsule by mouth daily.    [provider]  naproxen (NAPROSYN) 500 MG tablet Take 1 tablet (500 mg total) by mouth 2 (two) times daily as needed. Patient taking differently: Take 500 mg by mouth 2 (two) times daily with a meal.  04/15/19   Jerrol Banana., MD  nitroGLYCERIN (NITROSTAT) 0.4 MG SL tablet Place 1 tablet (0.4 mg total) under the tongue every 5 (five) minutes as needed for chest pain. Patient taking differently: Place 0.4 mg under the tongue every 5 (five) minutes as needed for chest pain.  08/22/17   Jerrol Banana., MD  omeprazole (PRILOSEC) 20 MG capsule TAKE 1 CAPSULE BY MOUTH 2 TIMES  DAILY BEFORE A MEAL. Patient taking differently: Take 20 mg by mouth 2 (two) times daily before a meal.  07/12/19   Jerrol Banana., MD  ondansetron (ZOFRAN ODT) 4 MG disintegrating tablet Take 1 tablet (4 mg total) by mouth every 8 (eight) hours as needed for nausea or vomiting. 01/18/20   Carrie Mew, MD  oxyCODONE (OXY IR/ROXICODONE) 5 MG immediate release tablet Take 1 tablet (5 mg total) by mouth every 4 (four) hours as needed for severe pain. Patient not taking: Reported on 12/13/2019 11/02/19   Benjaman Kindler, MD  predniSONE (DELTASONE) 10 MG tablet Take 6 Tablets Day 1, 5 Tablets Day 2, 4 Tablets Day 3, 3 Tablets Day 4, 2 Tablets Day 5, 1 Tablet Day 6 12/13/19   Jerrol Banana., MD  prochlorperazine (COMPAZINE) 10 MG tablet Take 1 tablet (10 mg total) by mouth every 6 (six) hours as needed for nausea or vomiting (migraine). Patient taking differently: Take 10 mg by mouth every 6 (six) hours as needed for nausea or vomiting (migraine).  10/04/17   Eula Listen, MD  promethazine (PHENERGAN) 25 MG tablet Take 1 tablet (25 mg total) by mouth every 6 (six) hours as needed for nausea or vomiting. Patient taking differently: Take 25 mg by mouth every 6 (six) hours as needed for nausea or vomiting.  09/12/17   Jerrol Banana., MD  RESTASIS 0.05 % ophthalmic emulsion Place 1 drop into both eyes 2 (two) times daily.  07/12/19   [provider]  TRULICITY 9.38 HW/2.9HB SOPN INJECT 0.75 MG INTO THE SKIN ONCE A WEEK. 01/11/20   Jerrol Banana., MD    Physical Exam: Vitals:   01/23/20 0130 01/23/20 0200 01/23/20 0230 01/23/20 0346  BP: (!) 149/88 140/83 140/87 (!) 152/98  Pulse: 81 79 79 80  Resp: 19 17 15 10   Temp:      TempSrc:      SpO2: 98%   98%  Weight:      Height:         Vitals:   01/23/20 0130 01/23/20  0200 01/23/20 0230 01/23/20 0346  BP: (!) 149/88 140/83 140/87 (!) 152/98  Pulse: 81 79 79 80  Resp: 19 17 15 10   Temp:      TempSrc:        SpO2: 98%   98%  Weight:      Height:          Constitutional: Alert and oriented x 3 . Not in any apparent distress HEENT:      Head: Normocephalic and atraumatic.         Eyes: PERLA, EOMI, Conjunctivae are normal. Sclera is non-icteric.       Mouth/Throat: Mucous membranes are moist.       Neck: Supple with no signs of meningismus. Cardiovascular: Regular rate and rhythm. No murmurs, gallops, or rubs. 2+ symmetrical distal pulses are present . No JVD. No LE edema Respiratory: Respiratory effort normal .Lungs sounds clear bilaterally. No wheezes, crackles, or rhonchi.  Gastrointestinal: Soft, non tender, and non distended with positive bowel sounds. No rebound or guarding. Genitourinary: No CVA tenderness. Musculoskeletal: Nontender with normal range of motion in all extremities. No edema, cyanosis, or erythema of extremities. Neurologic: Normal speech and language. Face is symmetric. Moving all extremities. No gross focal neurologic deficits . Skin: Skin is warm, dry.  No rash or ulcers Psychiatric: Mood and affect are normal Speech and behavior are normal   Labs on Admission: I have personally reviewed following labs and imaging studies  CBC: Recent Labs  Lab 01/18/20 0856 01/22/20 1758  WBC 17.1* 4.9  HGB 14.3 13.0  HCT 43.4 38.6  MCV 83.9 83.5  PLT 225 403   Basic Metabolic Panel: Recent Labs  Lab 01/18/20 0856 01/22/20 1758  NA 134* 138  K 4.9 4.1  CL 102 104  CO2 22 24  GLUCOSE 220* 213*  BUN 24* 14  CREATININE 0.84 0.86  CALCIUM 9.6 9.4  MG  --  1.4*   GFR: Estimated Creatinine Clearance: 86.5 mL/min (by C-G formula based on SCr of 0.86 mg/dL). Liver Function Tests: Recent Labs  Lab 01/18/20 0856  AST 35  ALT 32  ALKPHOS 60  BILITOT 1.0  PROT 6.7  ALBUMIN 4.3   Recent Labs  Lab 01/18/20 0856  LIPASE 44   No results for input(s): AMMONIA in the last 168 hours. Coagulation Profile: No results for input(s): INR, PROTIME in the last 168  hours. Cardiac Enzymes: No results for input(s): CKTOTAL, CKMB, CKMBINDEX, TROPONINI in the last 168 hours. BNP (last 3 results) No results for input(s): PROBNP in the last 8760 hours. HbA1C: No results for input(s): HGBA1C in the last 72 hours. CBG: No results for input(s): GLUCAP in the last 168 hours. Lipid Profile: No results for input(s): CHOL, HDL, LDLCALC, TRIG, CHOLHDL, LDLDIRECT in the last 72 hours. Thyroid Function Tests: No results for input(s): TSH, T4TOTAL, FREET4, T3FREE, THYROIDAB in the last 72 hours. Anemia Panel: No results for input(s): VITAMINB12, FOLATE, FERRITIN, TIBC, IRON, RETICCTPCT in the last 72 hours. Urine analysis:    Component Value Date/Time   COLORURINE YELLOW (A) 01/23/2020 0045   APPEARANCEUR CLEAR (A) 01/23/2020 0045   LABSPEC 1.010 01/23/2020 0045   PHURINE 5.0 01/23/2020 0045   GLUCOSEU NEGATIVE 01/23/2020 0045   HGBUR NEGATIVE 01/23/2020 0045   BILIRUBINUR NEGATIVE 01/23/2020 0045   BILIRUBINUR negative 10/23/2019 1526   KETONESUR NEGATIVE 01/23/2020 0045   PROTEINUR NEGATIVE 01/23/2020 0045   UROBILINOGEN 0.2 10/23/2019 1526   NITRITE NEGATIVE 01/23/2020 0045   LEUKOCYTESUR SMALL (A)  01/23/2020 0045    Radiological Exams on Admission: No results found.  EKG: Independently reviewed.   Assessment/Plan Principal Problem:   Intestinal infection presumed due to bacteria causing bloody diarrhea -Patient presents with persistent bloody diarrhea starting 4 days prior following a meal at a restaurant -Overall improved with Cipro and Flagyl prescribed on first visit to ER on 01/18/2020 but very weak -WBC has trended down now 4500 from 17,000 a few days prior -Hemoglobin remains at baseline 13-14 -CT abdomen and pelvis from 01/18/2020 showed colitis either infectious or inflammatory -Continue Cipro and Flagyl -Follow GI panel and stool for C. difficile -Clear liquid diet, IV hydration, supportive care    Generalized weakness -Secondary to  above along with electrolyte abnormality -Replete fluids electrolytes and monitor    Hypomagnesemia -Patient received IV magnesium in the ER -Monitor magnesium    Chronic pain syndrome -Continue gabapentin, Roxicodone pending med rec    Type 2 diabetes mellitus without complication (HCC) -Regular insulin coverage however patient voices preference to get her home Trulicity and not insulin in the hospital     History of non-ST elevation myocardial infarction (NSTEMI) -Continue aspirin, atorvastatin, nitroglycerin sublingual as needed pending med rec    HTN (hypertension) -Continue home benazepril    DVT prophylaxis: Lovenox  Code Status: full code  Family Communication:  none  Disposition Plan: Back to previous home environment Consults called: none  Status:obs    Athena Masse MD [facility]     01/23/2020, 3:56 AM

## 2020-01-23 NOTE — Progress Notes (Signed)
[place] [facility] [place] NAME: Judith Fox    MR#:  [id number]  DATE OF BIRTH:  18-Apr-1966  SUBJECTIVE:  patient came in with generalized weakness it clinically to take PO along with cramping abdominal pain. She was recently seen in the emergency room for bloody diarrhea after eating at a restaurant started on Cipro and Flagyl.  No blood he diarrhea since coming to the ED. Just feel somewhat better. Tolerating clear liquid.  REVIEW OF SYSTEMS:   Review of Systems  Constitutional: Positive for malaise/fatigue. Negative for chills, fever and weight loss.  HENT: Negative for ear discharge, ear pain and nosebleeds.   Eyes: Negative for blurred vision, pain and discharge.  Respiratory: Negative for sputum production, shortness of breath, wheezing and stridor.   Cardiovascular: Negative for chest pain, palpitations, orthopnea and PND.  Gastrointestinal: Positive for abdominal pain. Negative for diarrhea, nausea and vomiting.  Genitourinary: Negative for frequency and urgency.  Musculoskeletal: Negative for back pain and joint pain.  Neurological: Positive for weakness. Negative for sensory change and speech change.  Psychiatric/Behavioral: Negative for depression and hallucinations. The patient is not nervous/anxious.    Tolerating Diet: Tolerating PT:   DRUG ALLERGIES:   Allergies  Allergen Reactions  . Latex Rash and Swelling    Eye swelling   . Butorphanol Other (See Comments)    drowsiness Somnolence Pt "knocked out for 12 hrs" when taking this   . Lactose Intolerance (Gi) Other (See Comments)    Reflux and Indigestion    VITALS:  Blood pressure 140/88, pulse 70, temperature 98.9 F (37.2 C), temperature source Oral, resp. rate 18, height 5\' 9"  (1.753 m), weight 81.6 kg, SpO2 99 %.  PHYSICAL EXAMINATION:   Physical Exam  GENERAL:  54 y.o.-year-old patient lying in the bed with no acute distress.  EYES: Pupils equal,  round, reactive to light and accommodation. No scleral icterus.   HEENT: Head atraumatic, normocephalic. Oropharynx and nasopharynx clear.  NECK:  Supple, no jugular venous distention. No thyroid enlargement, no tenderness.  LUNGS: Normal breath sounds bilaterally, no wheezing, rales, rhonchi. No use of accessory muscles of respiration.  CARDIOVASCULAR: S1, S2 normal. No murmurs, rubs, or gallops.  ABDOMEN: Soft, nontender, nondistended. Bowel sounds present. No organomegaly or mass.  EXTREMITIES: No cyanosis, clubbing or edema b/l.    NEUROLOGIC: Cranial nerves II through XII are intact. No focal Motor or sensory deficits b/l.   PSYCHIATRIC:  patient is alert and oriented x 3.  SKIN: No obvious rash, lesion, or ulcer.   LABORATORY PANEL:  CBC Recent Labs  Lab 01/23/20 0649  WBC 4.9  HGB 11.5*  HCT 34.2*  PLT 201    Chemistries  Recent Labs  Lab 01/18/20 0856 01/18/20 0856 01/22/20 1758 01/22/20 1758 01/23/20 0649  NA 134*   < > 138  --   --   K 4.9   < > 4.1  --   --   CL 102   < > 104  --   --   CO2 22   < > 24  --   --   GLUCOSE 220*   < > 213*  --   --   BUN 24*   < > 14  --   --   CREATININE 0.84   < > 0.86   < > 0.84  CALCIUM 9.6   < > 9.4  --   --   MG  --   --  1.4*  --   --   AST 35  --   --   --   --   ALT 32  --   --   --   --   ALKPHOS 60  --   --   --   --   BILITOT 1.0  --   --   --   --    < > = values in this interval not displayed.   Cardiac Enzymes No results for input(s): TROPONINI in the last 168 hours. RADIOLOGY:  No results found. ASSESSMENT AND PLAN:   Judith Fox is a 54 y.o. female with medical history significant for DM 2, HTN, chronic pain, history of MI, who presents to the emergency room for the second time in 4 days with a complaint of diarrhea with blood mixed in.  She was first seen on 01/18/2020 with a complaint of nausea vomiting diarrhea and lower abdominal pain that started a few hours after eating out at a  restaurant   Intestinal infection presumed due to bacteria causing bloody diarrhea -Patient presents with persistent bloody diarrhea starting 4 days prior following a meal at a restaurant -Overall improved with Cipro and Flagyl prescribed on first visit to ER on 01/18/2020 but very weak -WBC has trended down now 4500 from 17,000 a few days prior -Hemoglobin remains at baseline 13-14 -CT abdomen and pelvis from 01/18/2020 showed colitis either infectious or inflammatory -Continue Cipro and Flagyl -Follow GI panel and stool for C. Difficile--pt has not had BM yet! -Clear liquid diet, IV hydration, supportive care    Generalized weakness -Secondary to above along with electrolyte abnormality -Replete fluids electrolytes and monitor    Hypomagnesemia -Patient received IV magnesium in the ER -Monitor magnesium    Chronic pain syndrome -Continue gabapentin, Roxicodone pending med rec    Type 2 diabetes mellitus without complication (HCC) -Regular insulin coverage however patient voices preference to get her home Trulicity and not insulin in the hospital  -cont ssi for now    History of non-ST elevation myocardial infarction (NSTEMI) -Continue aspirin, atorvastatin, nitroglycerin sublingual as needed pending med rec    HTN (hypertension) -Continue home benazepril    DVT prophylaxis: Lovenox  Code Status: full code  Family Communication:  none  Disposition Plan: Back to previous home environment Consults called: none  Procedures:none   Status is: Inpatient  Remains inpatient appropriate because:IV treatments appropriate due to intensity of illness or inability to take PO   Dispo: The patient is from: Home              Anticipated d/c is to: Home              Anticipated d/c date is: 1 day              Patient currently is not medically stable to d/c.        TOTAL TIME TAKING CARE OF THIS PATIENT: 30 minutes.  >50% time spent on counselling and coordination of  care  Note: This dictation was prepared with Dragon dictation along with smaller phrase technology. Any transcriptional errors that result from this process are unintentional.  Fritzi Mandes M.D    [facility]   CC: Primary care physician; Jerrol Banana., MDPatient ID: Judith Fox, female   DOB: 1965-11-29, 54 y.o.   MRN: [id number]

## 2020-01-23 NOTE — Progress Notes (Signed)
PHARMACY CONSULT NOTE - FOLLOW UP  Pharmacy Consult for Electrolyte Monitoring and Replacement   Recent Labs: Potassium (mmol/L)  Date Value  01/22/2020 4.1   Magnesium (mg/dL)  Date Value  01/22/2020 1.4 (L)   Calcium (mg/dL)  Date Value  01/22/2020 9.4   Albumin (g/dL)  Date Value  01/18/2020 4.3  10/23/2019 4.4   Sodium (mmol/L)  Date Value  01/22/2020 138  10/23/2019 141     Assessment: 6/16:  Mag = 1.4   Goal of Therapy:  All electrolytes WNL   Plan:  Will order Mag Sulfate 4 gm IV X 1 and recheck electroltyes on 6/17 with AM labs.   Orene Desanctis ,PharmD Clinical Pharmacist 01/23/2020 5:07 PM

## 2020-01-23 NOTE — ED Notes (Signed)
Admitting MD Damita Dunnings at bedside

## 2020-01-23 NOTE — ED Notes (Signed)
POC negative  

## 2020-01-24 LAB — COMPREHENSIVE METABOLIC PANEL
ALT: 20 U/L (ref 0–44)
AST: 24 U/L (ref 15–41)
Albumin: 3.5 g/dL (ref 3.5–5.0)
Alkaline Phosphatase: 51 U/L (ref 38–126)
Anion gap: 9 (ref 5–15)
BUN: <5 mg/dL — ABNORMAL LOW (ref 6–20)
CO2: 24 mmol/L (ref 22–32)
Calcium: 8.9 mg/dL (ref 8.9–10.3)
Chloride: 106 mmol/L (ref 98–111)
Creatinine, Ser: 0.72 mg/dL (ref 0.44–1.00)
GFR calc Af Amer: >60 mL/min (ref >60)
GFR calc non Af Amer: >60 mL/min (ref >60)
Glucose, Bld: 196 mg/dL — ABNORMAL HIGH (ref 70–99)
Potassium: 3.7 mmol/L (ref 3.5–5.1)
Sodium: 139 mmol/L (ref 135–145)
Total Bilirubin: 0.8 mg/dL (ref 0.3–1.2)
Total Protein: 6.1 g/dL — ABNORMAL LOW (ref 6.5–8.1)

## 2020-01-24 LAB — GLUCOSE, CAPILLARY
Glucose-Capillary: 210 mg/dL — ABNORMAL HIGH (ref 70–99)
Glucose-Capillary: 229 mg/dL — ABNORMAL HIGH (ref 70–99)

## 2020-01-24 LAB — MAGNESIUM: Magnesium: 1.8 mg/dL (ref 1.7–2.4)

## 2020-01-24 LAB — PHOSPHORUS: Phosphorus: 3.2 mg/dL (ref 2.5–4.6)

## 2020-01-24 MED ORDER — ASPIRIN EC 81 MG PO TBEC
81.0000 mg | DELAYED_RELEASE_TABLET | Freq: Every day | ORAL | Status: DC
  Administered 2020-01-24: 81 mg via ORAL
  Filled 2020-01-24: qty 1

## 2020-01-24 MED ORDER — LORATADINE 10 MG PO TABS
10.0000 mg | ORAL_TABLET | Freq: Every day | ORAL | Status: DC | PRN
  Administered 2020-01-24: 10 mg via ORAL
  Filled 2020-01-24: qty 1

## 2020-01-24 MED ORDER — METFORMIN HCL 500 MG PO TABS
1000.0000 mg | ORAL_TABLET | Freq: Two times a day (BID) | ORAL | Status: DC
  Administered 2020-01-24: 1000 mg via ORAL
  Filled 2020-01-24: qty 2

## 2020-01-24 MED ORDER — METRONIDAZOLE 500 MG PO TABS
500.0000 mg | ORAL_TABLET | Freq: Three times a day (TID) | ORAL | Status: DC
  Administered 2020-01-24: 500 mg via ORAL
  Filled 2020-01-24 (×3): qty 1

## 2020-01-24 MED ORDER — DOXYCYCLINE HYCLATE 20 MG PO TABS: 20.0000 mg | ORAL_TABLET | Freq: Two times a day (BID) | ORAL | Status: DC

## 2020-01-24 MED ORDER — GLIPIZIDE 10 MG PO TABS
10.0000 mg | ORAL_TABLET | Freq: Every day | ORAL | Status: DC
  Filled 2020-01-24: qty 1

## 2020-01-24 MED ORDER — ASPIRIN-ACETAMINOPHEN-CAFFEINE 250-250-65 MG PO TABS: 1.0000 | ORAL_TABLET | Freq: Four times a day (QID) | ORAL | Status: DC | PRN

## 2020-01-24 MED ORDER — TRAMADOL HCL 50 MG PO TABS: 50.0000 mg | ORAL_TABLET | Freq: Four times a day (QID) | ORAL | Status: DC | PRN

## 2020-01-24 MED ORDER — CIPROFLOXACIN HCL 500 MG PO TABS
500.0000 mg | ORAL_TABLET | Freq: Two times a day (BID) | ORAL | Status: DC
  Administered 2020-01-24: 500 mg via ORAL
  Filled 2020-01-24: qty 1

## 2020-01-24 MED ORDER — BUTALBITAL-APAP-CAFFEINE 50-325-40 MG PO TABS
1.0000 | ORAL_TABLET | Freq: Four times a day (QID) | ORAL | Status: DC | PRN
  Administered 2020-01-24: 1 via ORAL
  Filled 2020-01-24: qty 1

## 2020-01-24 MED ORDER — METRONIDAZOLE IN NACL 5-0.79 MG/ML-% IV SOLN
500.0000 mg | Freq: Three times a day (TID) | INTRAVENOUS | Status: DC
  Filled 2020-01-24: qty 100

## 2020-01-24 MED ORDER — KETOROLAC TROMETHAMINE 30 MG/ML IJ SOLN
30.0000 mg | Freq: Once | INTRAMUSCULAR | Status: AC
  Administered 2020-01-24: 30 mg via INTRAVENOUS
  Filled 2020-01-24: qty 1

## 2020-01-24 MED ORDER — METRONIDAZOLE IN NACL 5-0.79 MG/ML-% IV SOLN
500.0000 mg | Freq: Three times a day (TID) | INTRAVENOUS | Status: DC
  Administered 2020-01-24: 500 mg via INTRAVENOUS
  Filled 2020-01-24 (×4): qty 100

## 2020-01-24 MED ORDER — DIPHENHYDRAMINE HCL 25 MG PO CAPS
25.0000 mg | ORAL_CAPSULE | Freq: Once | ORAL | Status: AC
  Administered 2020-01-24: 25 mg via ORAL
  Filled 2020-01-24: qty 1

## 2020-01-24 MED ORDER — PANTOPRAZOLE SODIUM 40 MG PO TBEC
40.0000 mg | DELAYED_RELEASE_TABLET | Freq: Every day | ORAL | Status: DC
  Administered 2020-01-24: 40 mg via ORAL
  Filled 2020-01-24: qty 1

## 2020-01-24 NOTE — Discharge Summary (Signed)
[place] [facility] [place] NAME: Judith Fox    MR#:  [id number]  DATE OF BIRTH:  1965-11-17  DATE OF ADMISSION:  01/22/2020 ADMITTING PHYSICIAN: Athena Masse, MD  DATE OF DISCHARGE: 01/24/2020  PRIMARY CARE PHYSICIAN: Jerrol Banana., MD    ADMISSION DIAGNOSIS:  Intestinal infection due to bacteria causing bloody diarrhea [A04.9]  DISCHARGE DIAGNOSIS:  Vomiting with bloody diarrhea suspected food poisoning--resolving  SECONDARY DIAGNOSIS:   Past Medical History:  Diagnosis Date   Allergy    Arthritis    KNEE   Asthma    "mild" per pt   Complication of anesthesia    PT HAD A MI AFTER KNEE SURGERY IN 2014 FROM HIGH BLOOD PRESSURE-SURGERY WAS DONE AT AN [place]   Diabetes mellitus without complication ([place])    History of hiatal hernia    History of kidney stones    Hypertension    Migraine    Myocardial infarction [facility]) 2014   no stents   Pneumonia    h/o    HOSPITAL COURSE:  Judith Fox a 54 y.o.femalewith medical history significant forDM 2, HTN, chronic pain, history of MI, who presents to the emergency room for the second time in 4 days with a complaint of diarrhea with blood mixed in. She was first seen on 01/18/2020 with a complaint of nausea vomiting diarrhea and lower abdominal pain that started a few hours after eating out at a restaurant  Intestinal infection presumeddue to food poisoning causing bloody diarrhea -Patient presents with persistent bloody diarrhea starting 4 days prior following a meal at a restaurant--now resolved. No BM since coming to ER -Overall improved with Cipro and Flagyl prescribed on first visit to ER on 01/18/2020 but very weak -WBC has trended down now 4500 from 17,000 a few days prior -Hemoglobin remains at baseline 13-14 -CT abdomen and pelvis from 01/18/2020 showed colitis either infectious or inflammatory -Continue Cipro and Flagyl  changed to po. -Follow GI panel and stool for C. Difficile--pt has not had BM yet! -recieved IV hydration, supportive care  Generalized weakness -Secondary to above along with electrolyte abnormality -electrolyte stable.  Hypomagnesemia -Patient received IV magnesium in the ER -Monitor magnesium  Chronic pain syndrome -Continue gabapentin, Roxicodone   Type 2 diabetes mellitus without complication ([place]) -resume home meds now that patient is able tolerated PO diet  History of non-ST elevation myocardial infarction (NSTEMI) -Continue aspirin, atorvastatin, nitroglycerin sublingual as needed pending med rec  HTN (hypertension) -Continue home benazepril   DVT prophylaxis:Lovenox  Code Status:full code Family Communication:none Disposition Plan:Back to previous home environment Consults called:none Procedures:none   Status is: Inpatient   Dispo: The patient is from: Home  Anticipated d/c is to: Home  Anticipated d/c date is: today  Patient currently is medically stable to d/c.    CONSULTS OBTAINED:    DRUG ALLERGIES:   Allergies  Allergen Reactions   Latex Rash and Swelling    Eye swelling    Butorphanol Other (See Comments)    drowsiness Somnolence Pt "knocked out for 12 hrs" when taking this    Lactose Intolerance (Gi) Other (See Comments)    Reflux and Indigestion    DISCHARGE MEDICATIONS:   Allergies as of 01/24/2020      Reactions   Latex Rash, Swelling   Eye swelling   Butorphanol Other (See Comments)   drowsiness Somnolence Pt "knocked out for 12 hrs" when taking this   Lactose Intolerance (  gi) Other (See Comments)   Reflux and Indigestion      Medication List    STOP taking these medications   diphenhydrAMINE 50 MG/ML injection Commonly known as: BENADRYL     TAKE these medications   aspirin EC 81 MG tablet Take 81 mg by mouth daily.   atorvastatin 40 MG  tablet Commonly known as: LIPITOR TAKE 1 TABLET BY MOUTH EVERY DAY What changed: when to take this   benazepril 40 MG tablet Commonly known as: LOTENSIN TAKE 1 TABLET BY MOUTH EVERY DAY What changed: when to take this   CALCIUM-VITAMIN D PO Take 2 tablets by mouth daily.   ciprofloxacin 500 MG tablet Commonly known as: CIPRO Take 1 tablet (500 mg total) by mouth 2 (two) times daily for 10 days.   doxycycline 20 MG tablet Commonly known as: PERIOSTAT TAKE 1 TABLET BY MOUTH TWICE A DAY   fexofenadine 180 MG tablet Commonly known as: ALLEGRA Take 180 mg by mouth in the morning.   fluconazole 150 MG tablet Commonly known as: DIFLUCAN Take 150 mg by mouth once a week. And prn   glipiZIDE 10 MG tablet Commonly known as: GLUCOTROL TAKE 1 TABLET (10 MG TOTAL) BY MOUTH DAILY BEFORE BREAKFAST.   ketorolac 10 MG tablet Commonly known as: TORADOL Take 1 tablet (10 mg total) by mouth every 8 (eight) hours as needed for moderate pain (with food). What changed: Another medication with the same name was removed. Continue taking this medication, and follow the directions you see here.   levonorgestrel 20 MCG/24HR IUD Commonly known as: MIRENA 1 each by Intrauterine route once.   metFORMIN 1000 MG tablet Commonly known as: GLUCOPHAGE TAKE 1 TABLET BY MOUTH 2 TIMES DAILY WITH A MEAL. What changed: See the new instructions.   metoCLOPramide 5 MG/ML injection Commonly known as: REGLAN Inject 10 mg into the vein daily as needed (migraine). What changed: Another medication with the same name was removed. Continue taking this medication, and follow the directions you see here.   metroNIDAZOLE 500 MG tablet Commonly known as: FLAGYL Take 1 tablet (500 mg total) by mouth 3 (three) times daily for 10 days.   multivitamin capsule Take 1 capsule by mouth daily.   naproxen 500 MG tablet Commonly known as: NAPROSYN Take 1 tablet (500 mg total) by mouth 2 (two) times daily as  needed. What changed: when to take this   nitroGLYCERIN 0.4 MG SL tablet Commonly known as: NITROSTAT Place 1 tablet (0.4 mg total) under the tongue every 5 (five) minutes as needed for chest pain.   omeprazole 20 MG capsule Commonly known as: PRILOSEC TAKE 1 CAPSULE BY MOUTH 2 TIMES DAILY BEFORE A MEAL. What changed: See the new instructions.   ondansetron 4 MG disintegrating tablet Commonly known as: Zofran ODT Take 1 tablet (4 mg total) by mouth every 8 (eight) hours as needed for nausea or vomiting.   oxyCODONE 5 MG immediate release tablet Commonly known as: Oxy IR/ROXICODONE Take 1 tablet (5 mg total) by mouth every 4 (four) hours as needed for severe pain.   prochlorperazine 10 MG tablet Commonly known as: COMPAZINE Take 1 tablet (10 mg total) by mouth every 6 (six) hours as needed for nausea or vomiting (migraine).   promethazine 25 MG tablet Commonly known as: PHENERGAN Take 1 tablet (25 mg total) by mouth every 6 (six) hours as needed for nausea or vomiting.   Restasis 0.05 % ophthalmic emulsion Generic drug: cycloSPORINE Place 1 drop into  both eyes 2 (two) times daily.   traMADol 50 MG tablet Commonly known as: ULTRAM Take 50 mg by mouth every 6 (six) hours as needed.   Trulicity 7.12 WP/8.0DX Sopn Generic drug: Dulaglutide INJECT 0.75 MG INTO THE SKIN ONCE A WEEK.       If you experience worsening of your admission symptoms, develop shortness of breath, life threatening emergency, suicidal or homicidal thoughts you must seek medical attention immediately by calling 911 or calling your MD immediately  if symptoms less severe.  You Must read complete instructions/literature along with all the possible adverse reactions/side effects for all the Medicines you take and that have been prescribed to you. Take any new Medicines after you have completely understood and accept all the possible adverse reactions/side effects.   Please note  You were cared for by a  hospitalist during your hospital stay. If you have any questions about your discharge medications or the care you received while you were in the hospital after you are discharged, you can call the unit and asked to speak with the hospitalist on call if the hospitalist that took care of you is not available. Once you are discharged, your primary care physician will handle any further medical issues. Please note that NO REFILLS for any discharge medications will be authorized once you are discharged, as it is imperative that you return to your primary care physician (or establish a relationship with a primary care physician if you do not have one) for your aftercare needs so that they can reassess your need for medications and monitor your lab values. Today   SUBJECTIVE   Having a migraine headache. wants Toradol, Zofran, Benadryl combination that works better for her.  VITAL SIGNS:  Blood pressure (!) 144/91, pulse 66, temperature 98.1 F (36.7 C), temperature source Oral, resp. rate 14, height 5\' 9"  (1.753 m), weight 81.6 kg, SpO2 100 %.  I/O:    Intake/Output Summary (Last 24 hours) at 01/24/2020 1420 Last data filed at 01/24/2020 0438 Gross per 24 hour  Intake 2624.71 ml  Output --  Net 2624.71 ml    PHYSICAL EXAMINATION:  GENERAL:  54 y.o.-year-old patient lying in the bed with no acute distress.  EYES: Pupils equal, round, reactive to light and accommodation. No scleral icterus.  HEENT: Head atraumatic, normocephalic. Oropharynx and nasopharynx clear.  NECK:  Supple, no jugular venous distention. No thyroid enlargement, no tenderness.  LUNGS: Normal breath sounds bilaterally, no wheezing, rales,rhonchi or crepitation. No use of accessory muscles of respiration.  CARDIOVASCULAR: S1, S2 normal. No murmurs, rubs, or gallops.  ABDOMEN: Soft, non-tender, non-distended. Bowel sounds present. No organomegaly or mass.  EXTREMITIES: No pedal edema, cyanosis, or clubbing.  NEUROLOGIC: Cranial  nerves II through XII are intact. Muscle strength 5/5 in all extremities. Sensation intact. Gait not checked.  PSYCHIATRIC: The patient is alert and oriented x 3.  SKIN: No obvious rash, lesion, or ulcer.   DATA REVIEW:   CBC  Recent Labs  Lab 01/23/20 0649  WBC 4.9  HGB 11.5*  HCT 34.2*  PLT 201    Chemistries  Recent Labs  Lab 01/24/20 0535  NA 139  K 3.7  CL 106  CO2 24  GLUCOSE 196*  BUN <5*  CREATININE 0.72  CALCIUM 8.9  MG 1.8  AST 24  ALT 20  ALKPHOS 51  BILITOT 0.8    Microbiology Results   Recent Results (from the past 240 hour(s))  SARS Coronavirus 2 by RT PCR (hospital order,  performed in [facility] hospital lab) Nasopharyngeal Nasopharyngeal Swab     Status: None   Collection Time: 01/23/20  7:50 AM   Specimen: Nasopharyngeal Swab  Result Value Ref Range Status   SARS Coronavirus 2 NEGATIVE NEGATIVE Final    Comment: (NOTE) SARS-CoV-2 target nucleic acids are NOT DETECTED.  The SARS-CoV-2 RNA is generally detectable in upper and lower respiratory specimens during the acute phase of infection. The lowest concentration of SARS-CoV-2 viral copies this assay can detect is 250 copies / mL. A negative result does not preclude SARS-CoV-2 infection and should not be used as the sole basis for treatment or other patient management decisions.  A negative result may occur with improper specimen collection / handling, submission of specimen other than nasopharyngeal swab, presence of viral mutation(s) within the areas targeted by this assay, and inadequate number of viral copies (<250 copies / mL). A negative result must be combined with clinical observations, patient history, and epidemiological information.  Fact Sheet for Patients:   StrictlyIdeas.no  Fact Sheet for Healthcare Providers: BankingDealers.co.za  This test is not yet approved or  cleared by the [place] FDA and has been authorized for  detection and/or diagnosis of SARS-CoV-2 by FDA under an Emergency Use Authorization (EUA).  This EUA will remain in effect (meaning this test can be used) for the duration of the COVID-19 declaration under Section 564(b)(1) of the Act, 21 U.S.C. section 360bbb-3(b)(1), unless the authorization is terminated or revoked sooner.  Performed at [facility], [address] [place], [place] [address]     RADIOLOGY:  No results found.   CODE STATUS:     Code Status Orders  (From admission, onward)         Start     Ordered   01/23/20 0350  Full code  Continuous        01/23/20 0353        Code Status History    Date Active Date Inactive Code Status Order ID Comments User Context   11/02/2019 0856 11/02/2019 1858 Full Code [id number]  Benjaman Kindler, MD Inpatient   Advance Care Planning Activity    Advance Directive Documentation     Most Recent Value  Type of Advance Directive Healthcare Power of [place], Living will  Pre-existing out of facility DNR order (yellow form or pink MOST form) --  "MOST" Form in Place? --       TOTAL TIME TAKING CARE OF THIS PATIENT: *40* minutes.    Fritzi Mandes M.D  [facility]    CC: Primary care physician; Jerrol Banana., MD

## 2020-01-24 NOTE — Progress Notes (Signed)
PHARMACY CONSULT NOTE - FOLLOW UP  Pharmacy Consult for Electrolyte Monitoring and Replacement   Recent Labs: Potassium (mmol/L)  Date Value  01/24/2020 3.7   Magnesium (mg/dL)  Date Value  01/24/2020 1.8   Calcium (mg/dL)  Date Value  01/24/2020 8.9   Albumin (g/dL)  Date Value  01/24/2020 3.5  10/23/2019 4.4   Phosphorus (mg/dL)  Date Value  01/24/2020 3.2   Sodium (mmol/L)  Date Value  01/24/2020 139  10/23/2019 141     Assessment: Pharmacy has been consulted to monitor and replace electrolytes in 53yo patient with generalized weakness.  Goal of Therapy:  All electrolytes WNL   Plan:  No supplementation needed currently. Will follow up with AM labs and replete as needed.   Pearla Dubonnet ,PharmD Clinical Pharmacist 01/24/2020 9:36 AM

## 2020-01-24 NOTE — Plan of Care (Signed)
Discharge order received. Patient mental status is at baseline. Vital signs stable . No signs of acute distress. Discharge instructions given. Patient verbalized understanding. No other issues noted at this time.   

## 2020-01-25 NOTE — Progress Notes (Deleted)
Established patient visit   Patient: Judith Fox   DOB: Aug 22, 1965   54 y.o. Female  MRN: [id number] Visit Date: 01/28/2020  Today's healthcare provider: Wilhemena Durie, MD   No chief complaint on file.  Subjective    HPI Follow up Hospitalization  Patient was admitted to [facility] on 01/22/2020 and discharged on 01/24/2020. She was treated for; Intestinal infection due to bacteria causing bloody diarrhea/Vomiting with bloody diarrhea suspected food poisoning--resolving Treatment for this included; see notes in chart. Telephone follow up was done on none She reports {excellent/good/fair:19992} compliance with treatment. She reports this condition is {resolved/improved/worsened:23923}.  ----------------------------------------------------------------------------------------- -    {Show patient history (optional):23778::" "}   Medications: Outpatient Medications Prior to Visit  Medication Sig  . aspirin EC 81 MG tablet Take 81 mg by mouth daily.  Marland Kitchen atorvastatin (LIPITOR) 40 MG tablet TAKE 1 TABLET BY MOUTH EVERY DAY (Patient taking differently: Take 40 mg by mouth every evening. )  . benazepril (LOTENSIN) 40 MG tablet TAKE 1 TABLET BY MOUTH EVERY DAY (Patient taking differently: Take 40 mg by mouth every morning. )  . CALCIUM-VITAMIN D PO Take 2 tablets by mouth daily.   . ciprofloxacin (CIPRO) 500 MG tablet Take 1 tablet (500 mg total) by mouth 2 (two) times daily for 10 days.  Marland Kitchen doxycycline (PERIOSTAT) 20 MG tablet TAKE 1 TABLET BY MOUTH TWICE A DAY  . fexofenadine (ALLEGRA) 180 MG tablet Take 180 mg by mouth in the morning.   . fluconazole (DIFLUCAN) 150 MG tablet Take 150 mg by mouth once a week. And prn  . glipiZIDE (GLUCOTROL) 10 MG tablet TAKE 1 TABLET (10 MG TOTAL) BY MOUTH DAILY BEFORE BREAKFAST.  Marland Kitchen ketorolac (TORADOL) 10 MG tablet Take 1 tablet (10 mg total) by mouth every 8 (eight) hours as needed for moderate pain (with food). (Patient taking  differently: Take 10 mg by mouth every 8 (eight) hours as needed for moderate pain (with food). )  . levonorgestrel (MIRENA) 20 MCG/24HR IUD 1 each by Intrauterine route once.  . metFORMIN (GLUCOPHAGE) 1000 MG tablet TAKE 1 TABLET BY MOUTH 2 TIMES DAILY WITH A MEAL. (Patient taking differently: Take 1,000 mg by mouth 2 (two) times daily with a meal. )  . metoCLOPramide (REGLAN) 5 MG/ML injection Inject 10 mg into the vein daily as needed (migraine).   . metroNIDAZOLE (FLAGYL) 500 MG tablet Take 1 tablet (500 mg total) by mouth 3 (three) times daily for 10 days.  . Multiple Vitamin (MULTIVITAMIN) capsule Take 1 capsule by mouth daily.  . naproxen (NAPROSYN) 500 MG tablet Take 1 tablet (500 mg total) by mouth 2 (two) times daily as needed. (Patient taking differently: Take 500 mg by mouth 2 (two) times daily with a meal. )  . nitroGLYCERIN (NITROSTAT) 0.4 MG SL tablet Place 1 tablet (0.4 mg total) under the tongue every 5 (five) minutes as needed for chest pain. (Patient taking differently: Place 0.4 mg under the tongue every 5 (five) minutes as needed for chest pain. )  . omeprazole (PRILOSEC) 20 MG capsule TAKE 1 CAPSULE BY MOUTH 2 TIMES DAILY BEFORE A MEAL. (Patient taking differently: Take 20 mg by mouth 2 (two) times daily before a meal. )  . ondansetron (ZOFRAN ODT) 4 MG disintegrating tablet Take 1 tablet (4 mg total) by mouth every 8 (eight) hours as needed for nausea or vomiting.  Marland Kitchen oxyCODONE (OXY IR/ROXICODONE) 5 MG immediate release tablet Take 1 tablet (5 mg total) by mouth  every 4 (four) hours as needed for severe pain.  Marland Kitchen prochlorperazine (COMPAZINE) 10 MG tablet Take 1 tablet (10 mg total) by mouth every 6 (six) hours as needed for nausea or vomiting (migraine). (Patient taking differently: Take 10 mg by mouth every 6 (six) hours as needed for nausea or vomiting (migraine). )  . promethazine (PHENERGAN) 25 MG tablet Take 1 tablet (25 mg total) by mouth every 6 (six) hours as needed for  nausea or vomiting. (Patient taking differently: Take 25 mg by mouth every 6 (six) hours as needed for nausea or vomiting. )  . RESTASIS 0.05 % ophthalmic emulsion Place 1 drop into both eyes 2 (two) times daily.   . traMADol (ULTRAM) 50 MG tablet Take 50 mg by mouth every 6 (six) hours as needed.  . TRULICITY 3.84 YK/5.9DJ SOPN INJECT 0.75 MG INTO THE SKIN ONCE A WEEK.   No facility-administered medications prior to visit.    Review of Systems  Constitutional: Negative for appetite change, chills, fatigue and fever.  Respiratory: Negative for chest tightness and shortness of breath.   Cardiovascular: Negative for chest pain and palpitations.  Gastrointestinal: Negative for abdominal pain, nausea and vomiting.  Neurological: Negative for dizziness and weakness.    {Heme  Chem  Endocrine  Serology  Results Review (optional):23779::" "}  Objective    There were no vitals taken for this visit. {Show previous vital signs (optional):23777::" "}  Physical Exam  ***  No results found for any visits on 01/28/20.  Assessment & Plan     ***  No follow-ups on file.      {provider attestation***:1}   Wilhemena Durie, MD  [facility] [phone] (phone) [phone] (fax)  [place]

## 2020-01-28 ENCOUNTER — Inpatient Hospital Stay: Payer: 59 | Admitting: Family Medicine

## 2020-02-05 ENCOUNTER — Other Ambulatory Visit: Payer: Self-pay | Admitting: Family Medicine

## 2020-02-05 NOTE — Telephone Encounter (Signed)
Requested medication (s) are due for refill today:  Yes  Requested medication (s) are on the active medication list:  Yes  Future visit scheduled:  yes  Last Refill: 04/15/19; # 180; RF x 1  Notes to clinic:  Recently in [place]. ; pt. Had abn. Hbg 11.5.  Pt. no showed her hosp. f/u appt. on 01/28/20; please advise.   Requested Prescriptions  Pending Prescriptions Disp Refills   naproxen (NAPROSYN) 500 MG tablet [Pharmacy Med Name: NAPROXEN 500 MG TABLET] 180 tablet 1    Sig: TAKE 1 TABLET BY MOUTH TWICE A DAY AS NEEDED      Analgesics:  NSAIDS Failed - 02/05/2020  1:22 PM      Failed - HGB in normal range and within 360 days    Hemoglobin  Date Value Ref Range Status  01/23/2020 11.5 (L) 12.0 - 15.0 g/dL Final  10/23/2019 13.3 11.1 - 15.9 g/dL Final          Passed - Cr in normal range and within 360 days    Creatinine, Ser  Date Value Ref Range Status  01/24/2020 0.72 0.44 - 1.00 mg/dL Final          Passed - Patient is not pregnant      Passed - Valid encounter within last 12 months    Recent Outpatient Visits           1 month ago Type 2 diabetes mellitus without complication, without long-term current use of insulin ([place])   [facility] Jerrol Banana., MD   3 months ago Left lower quadrant abdominal pain   [facility] [place], Kelby Aline, FNP   4 months ago Sinusitis, unspecified chronicity, unspecified location   [facility] Jerrol Banana., MD   5 months ago Type 2 diabetes mellitus without complication, without long-term current use of insulin [facility])   [facility] Jerrol Banana., MD   8 months ago Type 2 diabetes mellitus without complication, without long-term current use of insulin [facility])   [facility] Jerrol Banana., MD       Future Appointments             In 2 months Jerrol Banana., MD [place], [facility]

## 2020-03-31 ENCOUNTER — Telehealth: Payer: Self-pay

## 2020-03-31 NOTE — Telephone Encounter (Signed)
Patient called back to check if she could be seen sooner. Checked with Helene Kelp. There was no available appts. Per Helene Kelp, if the patient needs to be seen sooner than Thursday patient may need to go to UC. Patient was upset and stated that she can never get in to see her PCP. Please advise CB- [phone]

## 2020-03-31 NOTE — Telephone Encounter (Signed)
Copied from [place] [id number]. Topic: Appointment Scheduling - Scheduling Inquiry for Clinic >> Mar 31, 2020 10:11 AM Judith Fox wrote: Patient requesting an appointment for poison oak- she is allergic. Nothing available per epic.

## 2020-04-01 NOTE — Telephone Encounter (Signed)
Judith Fox said that she also offered the patient an appointment for tomorrow and the patient declined. Patient went to UC but left because she was around a lot of coughing and hacking and was concerned she might have been exposed to covid.

## 2020-04-08 ENCOUNTER — Encounter: Payer: Self-pay | Admitting: Podiatry

## 2020-04-08 ENCOUNTER — Other Ambulatory Visit: Payer: Self-pay

## 2020-04-08 ENCOUNTER — Ambulatory Visit: Payer: 59 | Admitting: Podiatry

## 2020-04-08 DIAGNOSIS — L600 Ingrowing nail: Secondary | ICD-10-CM

## 2020-04-08 DIAGNOSIS — L539 Erythematous condition, unspecified: Secondary | ICD-10-CM

## 2020-04-08 DIAGNOSIS — L03031 Cellulitis of right toe: Secondary | ICD-10-CM | POA: Diagnosis not present

## 2020-04-08 MED ORDER — DOXYCYCLINE HYCLATE 100 MG PO TABS: 100.0000 mg | ORAL_TABLET | Freq: Two times a day (BID) | ORAL | 0 refills | Status: DC

## 2020-04-08 NOTE — Progress Notes (Signed)
Subjective:  Patient ID: Judith Fox, female    DOB: 1966/07/27,  MRN: [id number]  Chief Complaint  Patient presents with  . Ingrown Toenail    Patient presents today for ingrown toenail top corner of right hallux x 4 days    53 y.o. female presents with the above complaint.  Patient presents with paronychia and a small superficial abscess on the distal tip of the toe.  Patient is progressing on worse.  She would like to know if there is something that could be done without taking the ingrown off.  She denies any other acute complaints she has not tried any treatment options.  She has not since been seen for this by me.   Review of Systems: Negative except as noted in the HPI. Denies N/V/F/Ch.  Past Medical History:  Diagnosis Date  . Allergy   . Arthritis    KNEE  . Asthma    "mild" per pt  . Complication of anesthesia    PT HAD A MI AFTER KNEE SURGERY IN 2014 FROM HIGH BLOOD PRESSURE-SURGERY WAS DONE AT AN OUTPATIENT SURGERY CENTER  . Diabetes mellitus without complication ([place])   . History of hiatal hernia   . History of kidney stones   . Hypertension   . Migraine   . Myocardial infarction ([place]) 2014   no stents  . Pneumonia    h/o    Current Outpatient Medications:  .  triamcinolone cream (KENALOG) 0.1 %, Apply topically., Disp: , Rfl:  .  aspirin EC 81 MG tablet, Take 81 mg by mouth daily., Disp: , Rfl:  .  atorvastatin (LIPITOR) 40 MG tablet, TAKE 1 TABLET BY MOUTH EVERY DAY (Patient taking differently: Take 40 mg by mouth every evening. ), Disp: 90 tablet, Rfl: 3 .  benazepril (LOTENSIN) 40 MG tablet, TAKE 1 TABLET BY MOUTH EVERY DAY (Patient taking differently: Take 40 mg by mouth every morning. ), Disp: 90 tablet, Rfl: 3 .  CALCIUM-VITAMIN D PO, Take 2 tablets by mouth daily. , Disp: , Rfl:  .  doxycycline (PERIOSTAT) 20 MG tablet, TAKE 1 TABLET BY MOUTH TWICE A DAY, Disp: 180 tablet, Rfl: 1 .  doxycycline (VIBRA-TABS) 100 MG tablet, Take 1 tablet (100 mg  total) by mouth 2 (two) times daily., Disp: 20 tablet, Rfl: 0 .  fexofenadine (ALLEGRA) 180 MG tablet, Take 180 mg by mouth in the morning. , Disp: , Rfl:  .  fluconazole (DIFLUCAN) 150 MG tablet, Take 150 mg by mouth once a week. And prn, Disp: , Rfl:  .  glipiZIDE (GLUCOTROL) 10 MG tablet, TAKE 1 TABLET (10 MG TOTAL) BY MOUTH DAILY BEFORE BREAKFAST., Disp: 90 tablet, Rfl: 3 .  ketorolac (TORADOL) 10 MG tablet, Take 1 tablet (10 mg total) by mouth every 8 (eight) hours as needed for moderate pain (with food). (Patient taking differently: Take 10 mg by mouth every 8 (eight) hours as needed for moderate pain (with food). ), Disp: 15 tablet, Rfl: 0 .  levonorgestrel (MIRENA) 20 MCG/24HR IUD, 1 each by Intrauterine route once., Disp: , Rfl:  .  metFORMIN (GLUCOPHAGE) 1000 MG tablet, TAKE 1 TABLET BY MOUTH 2 TIMES DAILY WITH A MEAL. (Patient taking differently: Take 1,000 mg by mouth 2 (two) times daily with a meal. ), Disp: 180 tablet, Rfl: 3 .  metoCLOPramide (REGLAN) 5 MG/ML injection, Inject 10 mg into the vein daily as needed (migraine). , Disp: , Rfl:  .  Multiple Vitamin (MULTIVITAMIN) capsule, Take 1 capsule by mouth  daily., Disp: , Rfl:  .  naproxen (NAPROSYN) 500 MG tablet, TAKE 1 TABLET BY MOUTH TWICE A DAY AS NEEDED, Disp: 180 tablet, Rfl: 1 .  nitroGLYCERIN (NITROSTAT) 0.4 MG SL tablet, Place 1 tablet (0.4 mg total) under the tongue every 5 (five) minutes as needed for chest pain. (Patient taking differently: Place 0.4 mg under the tongue every 5 (five) minutes as needed for chest pain. ), Disp: 50 tablet, Rfl: 3 .  omeprazole (PRILOSEC) 20 MG capsule, TAKE 1 CAPSULE BY MOUTH 2 TIMES DAILY BEFORE A MEAL. (Patient taking differently: Take 20 mg by mouth 2 (two) times daily before a meal. ), Disp: 180 capsule, Rfl: 3 .  ondansetron (ZOFRAN ODT) 4 MG disintegrating tablet, Take 1 tablet (4 mg total) by mouth every 8 (eight) hours as needed for nausea or vomiting., Disp: 20 tablet, Rfl: 0 .   oxyCODONE (OXY IR/ROXICODONE) 5 MG immediate release tablet, Take 1 tablet (5 mg total) by mouth every 4 (four) hours as needed for severe pain., Disp: 20 tablet, Rfl: 0 .  prochlorperazine (COMPAZINE) 10 MG tablet, Take 1 tablet (10 mg total) by mouth every 6 (six) hours as needed for nausea or vomiting (migraine). (Patient taking differently: Take 10 mg by mouth every 6 (six) hours as needed for nausea or vomiting (migraine). ), Disp: 15 tablet, Rfl: 0 .  promethazine (PHENERGAN) 25 MG tablet, Take 1 tablet (25 mg total) by mouth every 6 (six) hours as needed for nausea or vomiting. (Patient taking differently: Take 25 mg by mouth every 6 (six) hours as needed for nausea or vomiting. ), Disp: 35 tablet, Rfl: 0 .  RESTASIS 0.05 % ophthalmic emulsion, Place 1 drop into both eyes 2 (two) times daily. , Disp: , Rfl:  .  traMADol (ULTRAM) 50 MG tablet, Take 50 mg by mouth every 6 (six) hours as needed., Disp: , Rfl:  .  TRULICITY 1.54 MG/8.6PY SOPN, INJECT 0.75 MG INTO THE SKIN ONCE A WEEK., Disp: 4 pen, Rfl: 4  Social History   Tobacco Use  Smoking Status Never Smoker  Smokeless Tobacco Never Used    Allergies  Allergen Reactions  . Latex Rash and Swelling    Eye swelling   . Butorphanol Other (See Comments)    drowsiness Somnolence Pt "knocked out for 12 hrs" when taking this   . Lactose Intolerance (Gi) Other (See Comments)    Reflux and Indigestion   Objective:  There were no vitals filed for this visit. There is no height or weight on file to calculate BMI. Constitutional Well developed. Well nourished.  Vascular Dorsalis pedis pulses palpable bilaterally. Posterior tibial pulses palpable bilaterally. Capillary refill normal to all digits.  No cyanosis or clubbing noted. Pedal hair growth normal.  Neurologic Normal speech. Oriented to person, place, and time. Epicritic sensation to light touch grossly present bilaterally.  Dermatologic Painful ingrowing nail at medial nail  borders of the hallux nail right with a small superficial abscess formation.  Mild erythema noted.  No other clinical signs of infection noted. No other open wounds. No skin lesions.  Orthopedic: Normal joint ROM without pain or crepitus bilaterally. No visible deformities. No bony tenderness.   Radiographs: None Assessment:   1. Paronychia of toe of right foot due to ingrown toenail   2. Erythema    Plan:  Patient was evaluated and treated and all questions answered.  Ingrown Nail, right with underlying superficial abscess formation -I discussed with the patient the etiology of paronychia  and various treatment options were discussed.  Given that the abscess formation as it is at the distal tip of the toe as opposed to the entire ingrown portion of the nail I will hold off on taking the ingrown nail out however I believe patient will benefit from incision and drainage of the abscess and proper decompression. Procedure note for I&D of the right hallux abscess: The toe was anesthetized utilizing one-to-one mixture 1% lidocaine plain and half percent Marcaine plain 3 cc.  Once adequate anesthesia was obtained the right distal digit was visualized.  Betadine was applied for sterile prep of the skin.  Using a chisel blade the abscess was decompressed superficial purulent drainage was drained.  Did not appear to be penetrating past the dermal layer.  Once area appeared clear of infection the wound was dressed with Silvadene gauze and Coban. -Doxycycline was dispensed for skin and soft tissue prophylaxis. -In the future if the ingrown starts bothering her is causing her a lot of pain we will plan on removal of it.  No follow-ups on file.

## 2020-04-09 ENCOUNTER — Encounter: Payer: Self-pay | Admitting: Podiatry

## 2020-04-16 NOTE — Progress Notes (Signed)
I,April Miller,acting as a scribe for Wilhemena Durie, MD.,have documented all relevant documentation on the behalf of Wilhemena Durie, MD,as directed by  Wilhemena Durie, MD while in the presence of Wilhemena Durie, MD.   Established patient visit   Patient: Judith Fox   DOB: Apr 01, 1966   54 y.o. Female  MRN: [id number] Visit Date: 04/17/2020  Today's healthcare provider: Wilhemena Durie, MD   Chief Complaint  Patient presents with  . Diabetes  . Follow-up  . Hyperlipidemia  . Hypertension   Subjective    HPI  Patient doing well.  Patient does complain of chronic knee pain.  She has seen Reche Dixon in the past. Diabetes Mellitus Type II, follow-up  Lab Results  Component Value Date   HGBA1C 7.5 (A) 04/17/2020   HGBA1C 6.5 (A) 12/13/2019   HGBA1C 6.3 (A) 08/16/2019   Last seen for diabetes 4 months ago.  Management since then includes; Good control medication with 6.5 A1c.  Needs microalbumin on next visit. She reports good compliance with treatment. She is not having side effects. none  Home blood sugar records: fasting range: not checking lately  Episodes of hypoglycemia? No none   Current insulin regiment: n/a Most Recent Eye Exam: 07/04/2019  --------------------------------------------------------------------  Hypertension, follow-up  BP Readings from Last 3 Encounters:  04/17/20 123/88  01/24/20 (!) 144/91  01/18/20 135/70   Wt Readings from Last 3 Encounters:  04/17/20 188 lb (85.3 kg)  01/22/20 180 lb (81.6 kg)  12/13/19 189 lb (85.7 kg)     She was last seen for hypertension 4 months ago.  BP at that visit was 131/85. Management since that visit includes; on benazepril. She reports good compliance with treatment. She is not having side effects. none She is exercising. She is adherent to low salt diet.   Outside blood pressures are is checking-running higher.  She does not smoke.  Use of agents associated with  hypertension: none.   --------------------------------------------------------------------  Lipid/Cholesterol, follow-up  Last Lipid Panel: Lab Results  Component Value Date   CHOL 94 (L) 04/28/2018   LDLCALC 32 04/28/2018   HDL 47 04/28/2018   TRIG 75 04/28/2018    She was last seen for this 04/28/2018.  Management since that visit includes; on atorvastatin. She reports good compliance with treatment. She is not having side effects. none She is following a Regular diet. Current exercise: walking  Last metabolic panel Lab Results  Component Value Date   GLUCOSE 196 (H) 01/24/2020   NA 139 01/24/2020   K 3.7 01/24/2020   BUN <5 (L) 01/24/2020   CREATININE 0.72 01/24/2020   GFRNONAA >60 01/24/2020   GFRAA >60 01/24/2020   CALCIUM 8.9 01/24/2020   AST 24 01/24/2020   ALT 20 01/24/2020   The ASCVD Risk score (Goff DC Jr., et al., 2013) failed to calculate for the following reasons:   The patient has a prior MI or stroke diagnosis  --------------------------------------------------------------------      Medications: Outpatient Medications Prior to Visit  Medication Sig  . aspirin EC 81 MG tablet Take 81 mg by mouth daily.  Marland Kitchen atorvastatin (LIPITOR) 40 MG tablet TAKE 1 TABLET BY MOUTH EVERY DAY (Patient taking differently: Take 40 mg by mouth every evening. )  . benazepril (LOTENSIN) 40 MG tablet TAKE 1 TABLET BY MOUTH EVERY DAY (Patient taking differently: Take 40 mg by mouth every morning. )  . CALCIUM-VITAMIN D PO Take 2 tablets by mouth daily.   Marland Kitchen  doxycycline (PERIOSTAT) 20 MG tablet TAKE 1 TABLET BY MOUTH TWICE A DAY  . doxycycline (VIBRA-TABS) 100 MG tablet Take 1 tablet (100 mg total) by mouth 2 (two) times daily.  . fexofenadine (ALLEGRA) 180 MG tablet Take 180 mg by mouth in the morning.   . fluconazole (DIFLUCAN) 150 MG tablet Take 150 mg by mouth once a week. And prn  . glipiZIDE (GLUCOTROL) 10 MG tablet TAKE 1 TABLET (10 MG TOTAL) BY MOUTH DAILY BEFORE  BREAKFAST.  Marland Kitchen ketorolac (TORADOL) 10 MG tablet Take 1 tablet (10 mg total) by mouth every 8 (eight) hours as needed for moderate pain (with food). (Patient taking differently: Take 10 mg by mouth every 8 (eight) hours as needed for moderate pain (with food). )  . metFORMIN (GLUCOPHAGE) 1000 MG tablet TAKE 1 TABLET BY MOUTH 2 TIMES DAILY WITH A MEAL. (Patient taking differently: Take 1,000 mg by mouth 2 (two) times daily with a meal. )  . metoCLOPramide (REGLAN) 5 MG/ML injection Inject 10 mg into the vein daily as needed (migraine).   . Multiple Vitamin (MULTIVITAMIN) capsule Take 1 capsule by mouth daily.  . naproxen (NAPROSYN) 500 MG tablet TAKE 1 TABLET BY MOUTH TWICE A DAY AS NEEDED  . nitroGLYCERIN (NITROSTAT) 0.4 MG SL tablet Place 1 tablet (0.4 mg total) under the tongue every 5 (five) minutes as needed for chest pain. (Patient taking differently: Place 0.4 mg under the tongue every 5 (five) minutes as needed for chest pain. )  . omeprazole (PRILOSEC) 20 MG capsule TAKE 1 CAPSULE BY MOUTH 2 TIMES DAILY BEFORE A MEAL. (Patient taking differently: Take 20 mg by mouth 2 (two) times daily before a meal. )  . ondansetron (ZOFRAN ODT) 4 MG disintegrating tablet Take 1 tablet (4 mg total) by mouth every 8 (eight) hours as needed for nausea or vomiting.  Marland Kitchen oxyCODONE (OXY IR/ROXICODONE) 5 MG immediate release tablet Take 1 tablet (5 mg total) by mouth every 4 (four) hours as needed for severe pain.  Marland Kitchen prochlorperazine (COMPAZINE) 10 MG tablet Take 1 tablet (10 mg total) by mouth every 6 (six) hours as needed for nausea or vomiting (migraine). (Patient taking differently: Take 10 mg by mouth every 6 (six) hours as needed for nausea or vomiting (migraine). )  . promethazine (PHENERGAN) 25 MG tablet Take 1 tablet (25 mg total) by mouth every 6 (six) hours as needed for nausea or vomiting. (Patient taking differently: Take 25 mg by mouth every 6 (six) hours as needed for nausea or vomiting. )  . RESTASIS 0.05  % ophthalmic emulsion Place 1 drop into both eyes 2 (two) times daily.   . traMADol (ULTRAM) 50 MG tablet Take 50 mg by mouth every 6 (six) hours as needed.  . triamcinolone cream (KENALOG) 0.1 % Apply topically.  . TRULICITY 4.40 NU/2.7OZ SOPN INJECT 0.75 MG INTO THE SKIN ONCE A WEEK.  Marland Kitchen levonorgestrel (MIRENA) 20 MCG/24HR IUD 1 each by Intrauterine route once. (Patient not taking: Reported on 04/17/2020)   No facility-administered medications prior to visit.    Review of Systems  Constitutional: Negative for appetite change, chills, fatigue and fever.  Respiratory: Negative for chest tightness and shortness of breath.   Cardiovascular: Negative for chest pain and palpitations.  Gastrointestinal: Negative for abdominal pain, nausea and vomiting.  Neurological: Negative for dizziness and weakness.       Objective    BP 123/88 (BP Location: Right Arm, Patient Position: Sitting, Cuff Size: Large)   Pulse 93  Temp (!) 97 F (36.1 C) (Oral)   Resp 16   Ht 5\' 9"  (1.753 m)   Wt 188 lb (85.3 kg)   SpO2 97%   BMI 27.76 kg/m  Wt Readings from Last 3 Encounters:  04/17/20 188 lb (85.3 kg)  01/22/20 180 lb (81.6 kg)  12/13/19 189 lb (85.7 kg)      Physical Exam Vitals reviewed.  Constitutional:      Appearance: Normal appearance. She is well-developed and normal weight.  HENT:     Head: Normocephalic.     Right Ear: External ear normal.     Left Ear: External ear normal.     Nose: Nose normal.     Mouth/Throat:     Pharynx: No oropharyngeal exudate.  Eyes:     Conjunctiva/sclera: Conjunctivae normal.     Pupils: Pupils are equal, round, and reactive to light.  Cardiovascular:     Rate and Rhythm: Normal rate and regular rhythm.     Heart sounds: Normal heart sounds.  Pulmonary:     Effort: Pulmonary effort is normal. No respiratory distress.     Breath sounds: Wheezing present.  Abdominal:     Palpations: Abdomen is soft.  Musculoskeletal:        General: No  tenderness.     Cervical back: Normal range of motion and neck supple.  Lymphadenopathy:     Cervical: No cervical adenopathy.  Skin:    General: Skin is warm and dry.  Neurological:     General: No focal deficit present.     Mental Status: She is alert and oriented to person, place, and time. Mental status is at baseline.  Psychiatric:        Mood and Affect: Mood normal.        Behavior: Behavior normal.        Thought Content: Thought content normal.        Judgment: Judgment normal.     BP 123/88 (BP Location: Right Arm, Patient Position: Sitting, Cuff Size: Large)   Pulse 93   Temp (!) 97 F (36.1 C) (Oral)   Resp 16   Ht 5\' 9"  (1.753 m)   Wt 188 lb (85.3 kg)   SpO2 97%   BMI 27.76 kg/m                                                                  Results for orders placed or performed in visit on 04/17/20  POCT glycosylated hemoglobin (Hb A1C)  Result Value Ref Range   Hemoglobin A1C 7.5 (A) 4.0 - 5.6 %   Est. average glucose Bld gHb Est-mCnc 169     Assessment & Plan     1. Type 2 diabetes mellitus without complication, without long-term current use of insulin ([place]) Continue to work on lifestyle - POCT glycosylated hemoglobin (Hb A1C) 7.5 today. - Lipid panel - TSH - POCT UA - Microalbumin  2. Essential hypertension  - Lipid panel - TSH  3. Mixed hyperlipidemia  - Lipid panel - TSH 4.Knee Pain Refer to ortho.  5. Intestinal infection due to bacteria causing bloody diarrhea resolved  6. History of non-ST elevation myocardial infarction (NSTEMI) All risk factors treated.  Return in 17 weeks (on 08/14/2020) for CPE.  I, Wilhemena Durie, MD, have reviewed all documentation for this visit. The documentation on 04/26/20 for the exam, diagnosis, procedures, and orders are all accurate and complete.    Moriyah Byington Cranford Mon, MD  [facility] [phone] (phone) [phone] (fax)  [place]

## 2020-04-17 ENCOUNTER — Encounter: Payer: Self-pay | Admitting: Family Medicine

## 2020-04-17 ENCOUNTER — Ambulatory Visit (INDEPENDENT_AMBULATORY_CARE_PROVIDER_SITE_OTHER): Payer: 59 | Admitting: Family Medicine

## 2020-04-17 ENCOUNTER — Other Ambulatory Visit: Payer: Self-pay

## 2020-04-17 VITALS — BP 123/88 | HR 93 | Temp 97.0°F | Resp 16 | Ht 69.0 in | Wt 188.0 lb

## 2020-04-17 DIAGNOSIS — E119 Type 2 diabetes mellitus without complications: Secondary | ICD-10-CM

## 2020-04-17 DIAGNOSIS — A049 Bacterial intestinal infection, unspecified: Secondary | ICD-10-CM

## 2020-04-17 DIAGNOSIS — M25569 Pain in unspecified knee: Secondary | ICD-10-CM

## 2020-04-17 DIAGNOSIS — I252 Old myocardial infarction: Secondary | ICD-10-CM

## 2020-04-17 DIAGNOSIS — G8929 Other chronic pain: Secondary | ICD-10-CM

## 2020-04-17 DIAGNOSIS — I10 Essential (primary) hypertension: Secondary | ICD-10-CM

## 2020-04-17 DIAGNOSIS — E782 Mixed hyperlipidemia: Secondary | ICD-10-CM | POA: Diagnosis not present

## 2020-04-17 LAB — POCT UA - MICROALBUMIN: Microalbumin Ur, POC: 20 mg/L

## 2020-04-17 LAB — POCT GLYCOSYLATED HEMOGLOBIN (HGB A1C)
Est. average glucose Bld gHb Est-mCnc: 169
Hemoglobin A1C: 7.5 % — AB (ref 4.0–5.6)

## 2020-05-14 ENCOUNTER — Telehealth: Payer: Self-pay

## 2020-05-14 NOTE — Telephone Encounter (Signed)
Copied from [place] [phone]. Topic: General - Other >> May 14, 2020  2:20 PM Alanda Slim E wrote: Reason for CRM: pt needs to know if her Tdap has expired/ pt is expecting a grand child/ please advise

## 2020-05-15 NOTE — Telephone Encounter (Signed)
Patient advised she can get TDAP vac today.

## 2020-05-15 NOTE — Telephone Encounter (Signed)
Returned call to patient.

## 2020-05-15 NOTE — Telephone Encounter (Signed)
LMOVM for pt to cb.

## 2020-06-02 ENCOUNTER — Other Ambulatory Visit: Payer: Self-pay | Admitting: Obstetrics and Gynecology

## 2020-06-02 DIAGNOSIS — Z1231 Encounter for screening mammogram for malignant neoplasm of breast: Secondary | ICD-10-CM

## 2020-06-02 LAB — RESULTS CONSOLE HPV: CHL HPV: NEGATIVE

## 2020-06-02 LAB — HM PAP SMEAR: HM Pap smear: NEGATIVE

## 2020-06-26 ENCOUNTER — Other Ambulatory Visit: Payer: Self-pay | Admitting: Family Medicine

## 2020-06-26 DIAGNOSIS — E119 Type 2 diabetes mellitus without complications: Secondary | ICD-10-CM

## 2020-07-01 LAB — HM DIABETES EYE EXAM

## 2020-07-02 ENCOUNTER — Other Ambulatory Visit: Payer: Self-pay | Admitting: Family Medicine

## 2020-07-02 DIAGNOSIS — L719 Rosacea, unspecified: Secondary | ICD-10-CM

## 2020-07-02 NOTE — Telephone Encounter (Signed)
Requested medication (s) are due for refill today: Atorvastatin  Due 07/12/2019  Requested medication (s) are on the active medication list:yes  Last refill: 07/12/2019  #90  1 refill  Future visit scheduled yes 08/12/20  Notes to clinic:  Failed due to labs; last labs drawn 04/28/18       Periostat off  protocol.  Please review  Requested Prescriptions  Pending Prescriptions Disp Refills   atorvastatin (LIPITOR) 40 MG tablet [Pharmacy Med Name: ATORVASTATIN 40 MG TABLET] 90 tablet 3    Sig: TAKE 1 TABLET BY MOUTH EVERY DAY      Cardiovascular:  Antilipid - Statins Failed - 07/02/2020  1:41 AM      Failed - Total Cholesterol in normal range and within 360 days    Cholesterol, Total  Date Value Ref Range Status  04/28/2018 94 (L) 100 - 199 mg/dL Final          Failed - LDL in normal range and within 360 days    LDL Calculated  Date Value Ref Range Status  04/28/2018 32 0 - 99 mg/dL Final          Failed - HDL in normal range and within 360 days    HDL  Date Value Ref Range Status  04/28/2018 47 >39 mg/dL Final          Failed - Triglycerides in normal range and within 360 days    Triglycerides  Date Value Ref Range Status  04/28/2018 75 0 - 149 mg/dL Final          Passed - Patient is not pregnant      Passed - Valid encounter within last 12 months    Recent Outpatient Visits           2 months ago Type 2 diabetes mellitus without complication, without long-term current use of insulin ([place])   [facility] Jerrol Banana., MD   6 months ago Type 2 diabetes mellitus without complication, without long-term current use of insulin [facility])   [facility] Jerrol Banana., MD   8 months ago Left lower quadrant abdominal pain   [facility], Kelby Aline, FNP   9 months ago Sinusitis, unspecified chronicity, unspecified location   [facility] Jerrol Banana., MD   10 months ago Type 2  diabetes mellitus without complication, without long-term current use of insulin [facility])   [facility] Jerrol Banana., MD       Future Appointments             In 1 month Jerrol Banana., MD [facility]              doxycycline (PERIOSTAT) 20 MG tablet [Pharmacy Med Name: DOXYCYCLINE HYCLATE 20 MG TAB] 180 tablet 1    Sig: TAKE 1 TABLET BY MOUTH TWICE A DAY      Off-Protocol Failed - 07/02/2020  1:41 AM      Failed - Medication not assigned to a protocol, review manually.      Passed - Valid encounter within last 12 months    Recent Outpatient Visits           2 months ago Type 2 diabetes mellitus without complication, without long-term current use of insulin [facility])   [facility] Jerrol Banana., MD   6 months ago Type 2 diabetes mellitus without complication, without long-term current use of insulin ([place])  [facility] Jerrol Banana., MD   8 months ago Left lower quadrant abdominal pain   [facility], Kelby Aline, FNP   9 months ago Sinusitis, unspecified chronicity, unspecified location   [facility] Jerrol Banana., MD   10 months ago Type 2 diabetes mellitus without complication, without long-term current use of insulin [facility])   [facility] Jerrol Banana., MD       Future Appointments             In 1 month Jerrol Banana., MD [facility] [place]

## 2020-07-09 LAB — LIPID PANEL
Chol/HDL Ratio: 1.9 ratio (ref 0.0–4.4)
Cholesterol, Total: 96 mg/dL — ABNORMAL LOW (ref 100–199)
HDL: 51 mg/dL (ref >39)
LDL Chol Calc (NIH): 32 mg/dL (ref 0–99)
Triglycerides: 56 mg/dL (ref 0–149)
VLDL Cholesterol Cal: 13 mg/dL (ref 5–40)

## 2020-07-09 LAB — TSH: TSH: 1.5 u[IU]/mL (ref 0.450–4.500)

## 2020-07-16 ENCOUNTER — Other Ambulatory Visit: Payer: Self-pay | Admitting: Family Medicine

## 2020-07-16 DIAGNOSIS — K219 Gastro-esophageal reflux disease without esophagitis: Secondary | ICD-10-CM

## 2020-07-23 ENCOUNTER — Other Ambulatory Visit: Payer: Self-pay | Admitting: Obstetrics and Gynecology

## 2020-07-23 DIAGNOSIS — N6310 Unspecified lump in the right breast, unspecified quadrant: Secondary | ICD-10-CM

## 2020-07-29 ENCOUNTER — Other Ambulatory Visit: Payer: Self-pay | Admitting: Family Medicine

## 2020-08-12 ENCOUNTER — Encounter: Payer: Self-pay | Admitting: Family Medicine

## 2020-08-12 ENCOUNTER — Other Ambulatory Visit: Payer: Self-pay

## 2020-08-12 ENCOUNTER — Ambulatory Visit (INDEPENDENT_AMBULATORY_CARE_PROVIDER_SITE_OTHER): Payer: 59 | Admitting: Family Medicine

## 2020-08-12 ENCOUNTER — Ambulatory Visit
Admission: RE | Admit: 2020-08-12 | Discharge: 2020-08-12 | Disposition: A | Discharge status: Home / Self Care | Payer: 59 | Source: Ambulatory Visit | Attending: Obstetrics and Gynecology | Admitting: Obstetrics and Gynecology

## 2020-08-12 VITALS — BP 122/82 | HR 82 | Temp 98.9°F | Resp 16 | Ht 69.0 in | Wt 188.0 lb

## 2020-08-12 DIAGNOSIS — Z0000 Encounter for general adult medical examination without abnormal findings: Secondary | ICD-10-CM | POA: Diagnosis not present

## 2020-08-12 DIAGNOSIS — I10 Essential (primary) hypertension: Secondary | ICD-10-CM

## 2020-08-12 DIAGNOSIS — Z96659 Presence of unspecified artificial knee joint: Secondary | ICD-10-CM

## 2020-08-12 DIAGNOSIS — R42 Dizziness and giddiness: Secondary | ICD-10-CM | POA: Diagnosis not present

## 2020-08-12 DIAGNOSIS — I252 Old myocardial infarction: Secondary | ICD-10-CM

## 2020-08-12 DIAGNOSIS — N6310 Unspecified lump in the right breast, unspecified quadrant: Secondary | ICD-10-CM | POA: Diagnosis not present

## 2020-08-12 DIAGNOSIS — E782 Mixed hyperlipidemia: Secondary | ICD-10-CM | POA: Diagnosis not present

## 2020-08-12 DIAGNOSIS — E119 Type 2 diabetes mellitus without complications: Secondary | ICD-10-CM

## 2020-08-12 LAB — POCT GLYCOSYLATED HEMOGLOBIN (HGB A1C)
Est. average glucose Bld gHb Est-mCnc: 180
Hemoglobin A1C: 7.9 % — AB (ref 4.0–5.6)

## 2020-08-12 IMAGING — US US BREAST*R* LIMITED INC AXILLA
1 series · 5 of 5 positions shown · non-contrast
Comparison: Previous exam(s).

CLINICAL DATA: Palpable lump reported in the upper outer right
breast, with a similar area in noted in the left breast.

EXAM:
DIGITAL DIAGNOSTIC BILATERAL MAMMOGRAM WITH CAD AND TOMO
ULTRASOUND RIGHT BREAST

[Series 1: us breast*right* limited inc axilla · 0.07mm/px · 5 of 5 slices shown]
[im 1/5]
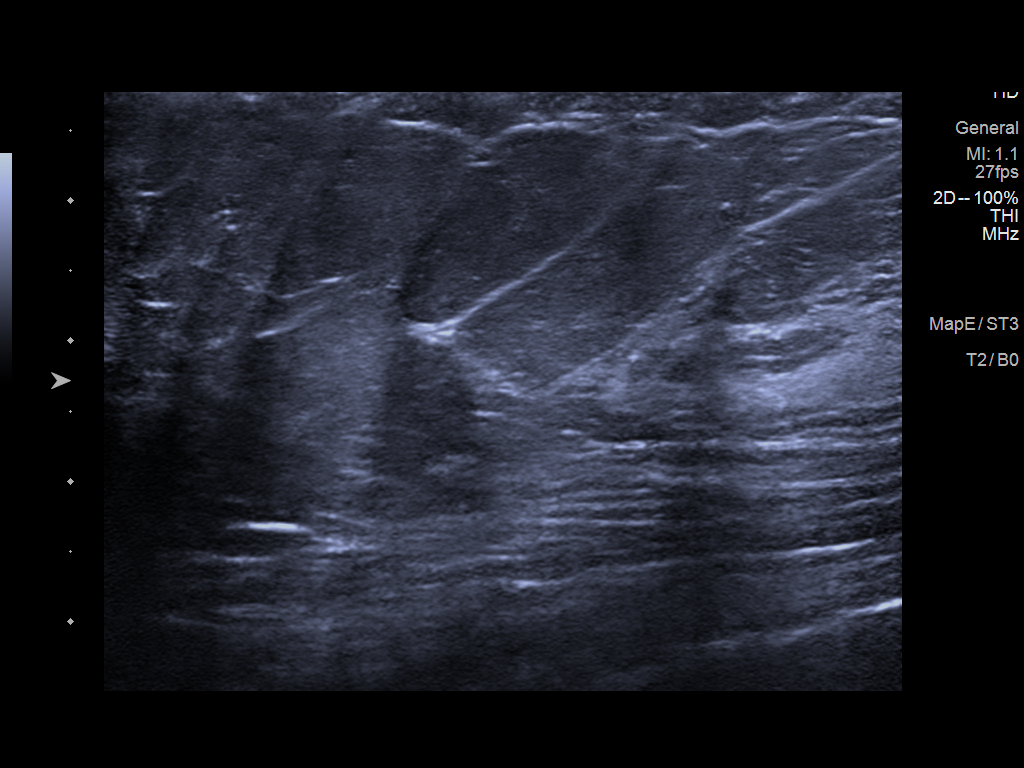
[im 2/5]
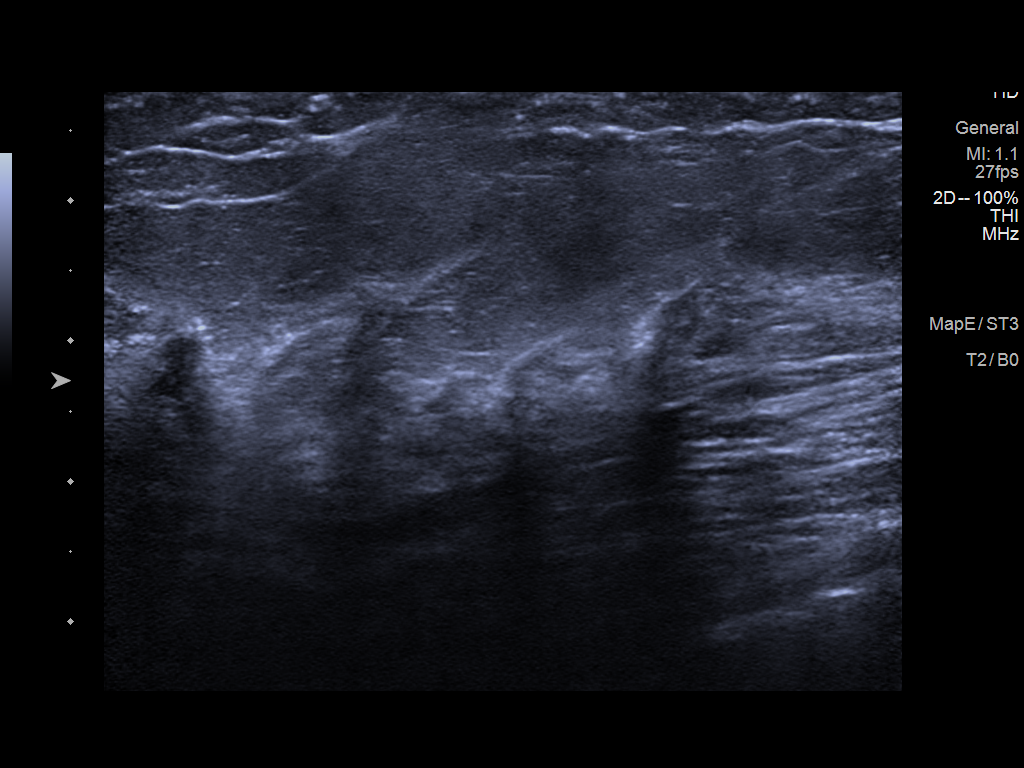
[im 3/5]
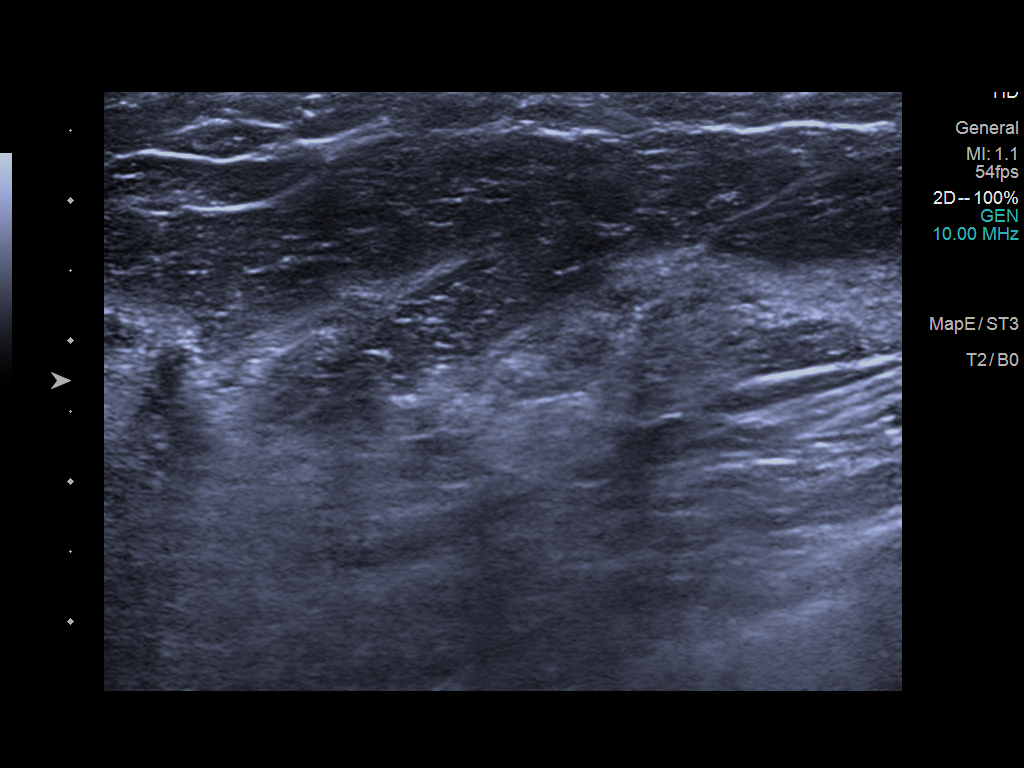
[im 4/5]
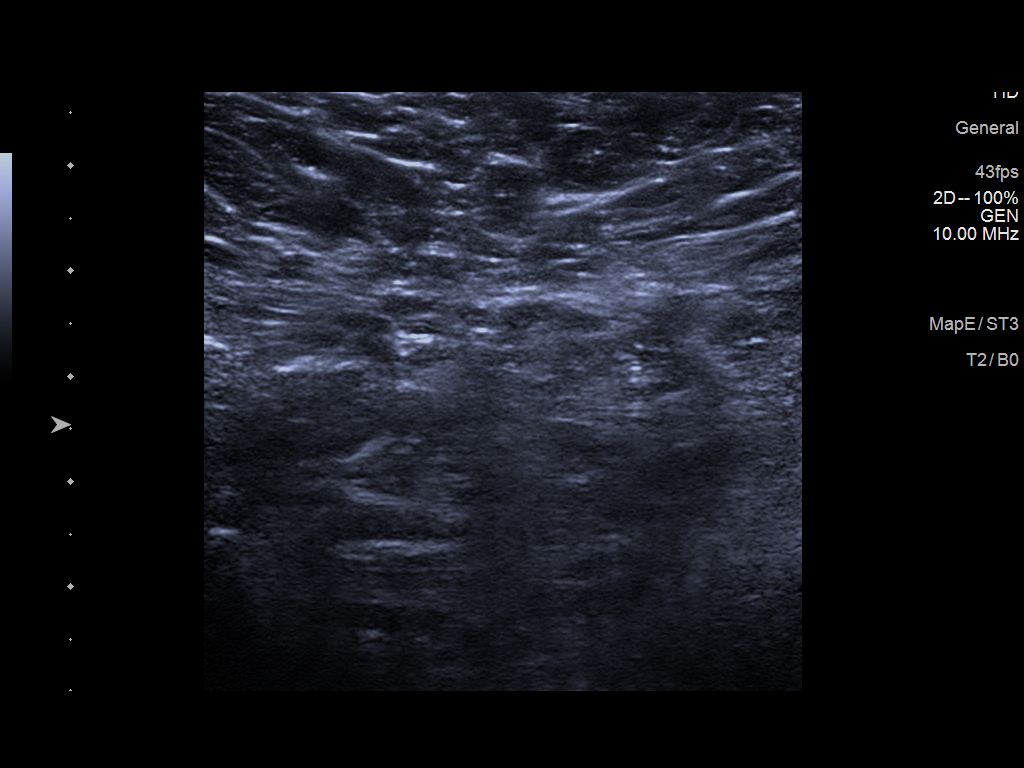
[im 5/5]
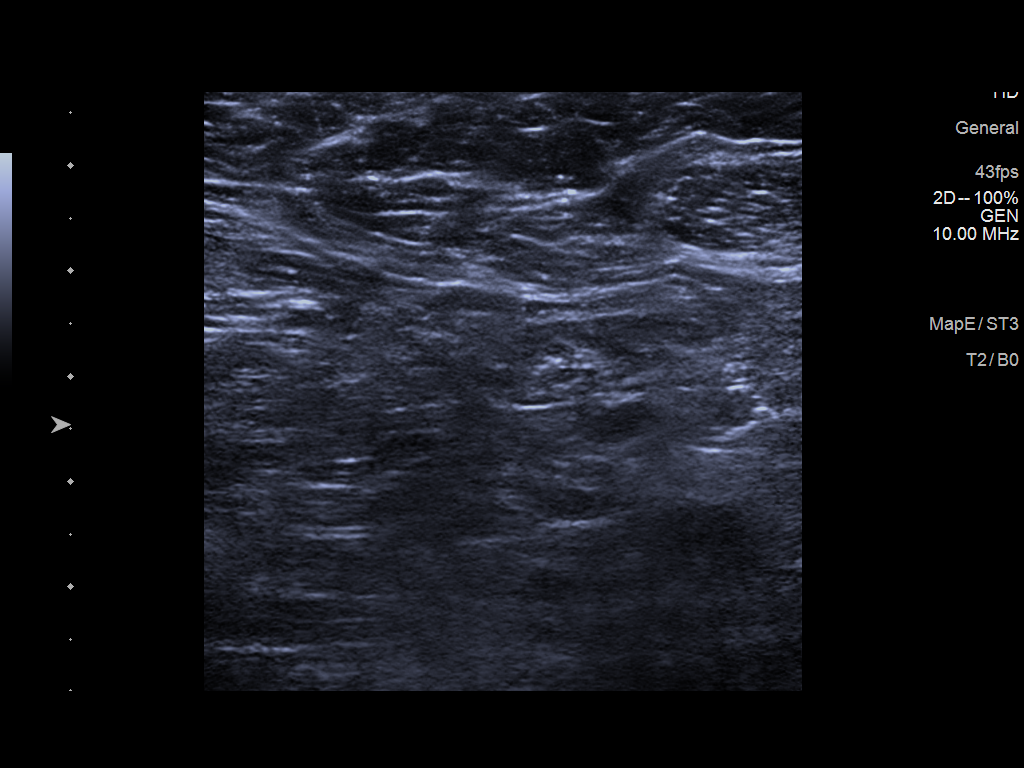

[5 of 5 positions shown; findings below may reference images not displayed]

ACR Breast Density Category c: The breast tissue is heterogeneously
dense, which may obscure small masses.
FINDINGS: There are no masses, areas of architectural distortion, areas of
significant asymmetry or suspicious calcifications. No mammographic
change.

Mammographic images were processed with CAD.

On physical exam, there is prominent fibroglandular tissue in the
upper outer aspects of both breasts, but no discrete masses.

Targeted right breast ultrasound is performed, showing normal
fibroglandular tissue throughout the upper outer quadrant. No mass,
cyst or suspicious lesion.
IMPRESSION: Normal exam.  No evidence of breast malignancy.

RECOMMENDATION:
Screening mammogram in one year.(Code:[EB])

I have discussed the findings and recommendations with the patient.
If applicable, a reminder letter will be sent to the patient
regarding the next appointment.

BI-RADS CATEGORY  1: Negative.

## 2020-08-12 IMAGING — MG DIGITAL DIAGNOSTIC BILAT W/ TOMO W/ CAD
8 series · 9 of 24 positions shown · non-contrast
Comparison: Previous exam(s).

CLINICAL DATA: Palpable lump reported in the upper outer right
breast, with a similar area in noted in the left breast.

EXAM:
DIGITAL DIAGNOSTIC BILATERAL MAMMOGRAM WITH CAD AND TOMO
ULTRASOUND RIGHT BREAST

[R CC synth-2D]
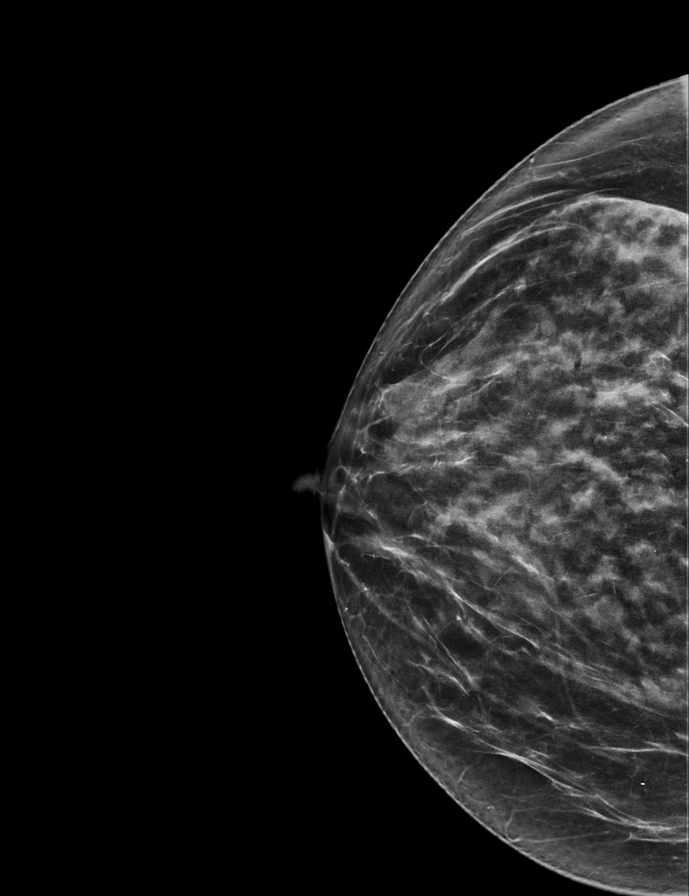

[R MLO synth-2D]
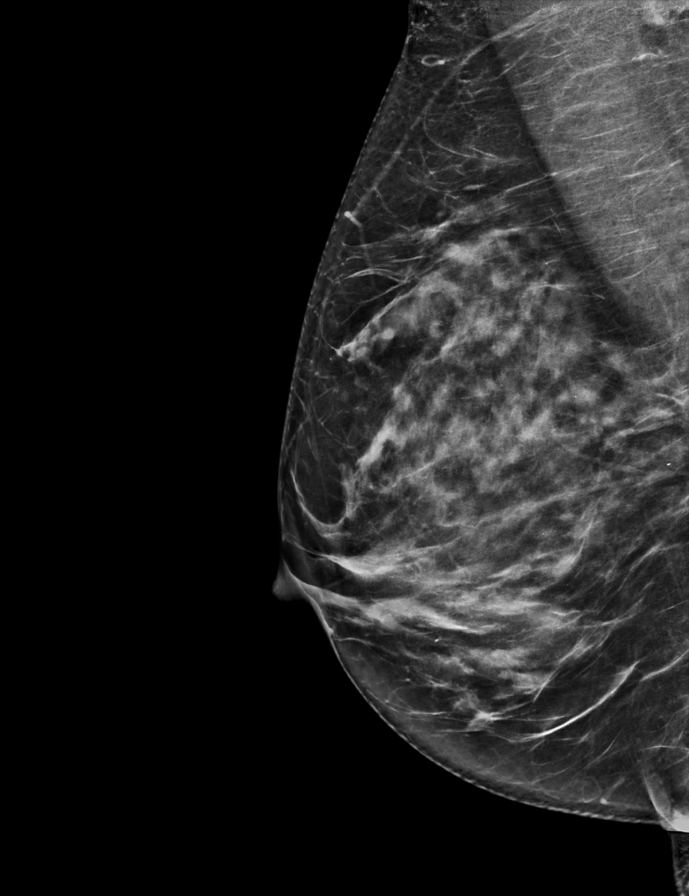

[L MLO synth-2D]
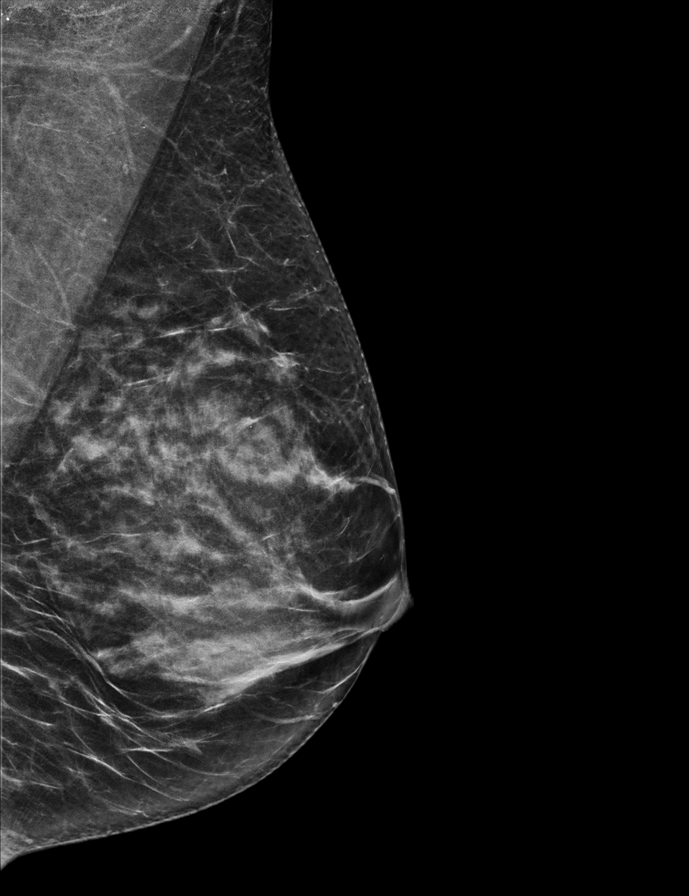

[L CC synth-2D]
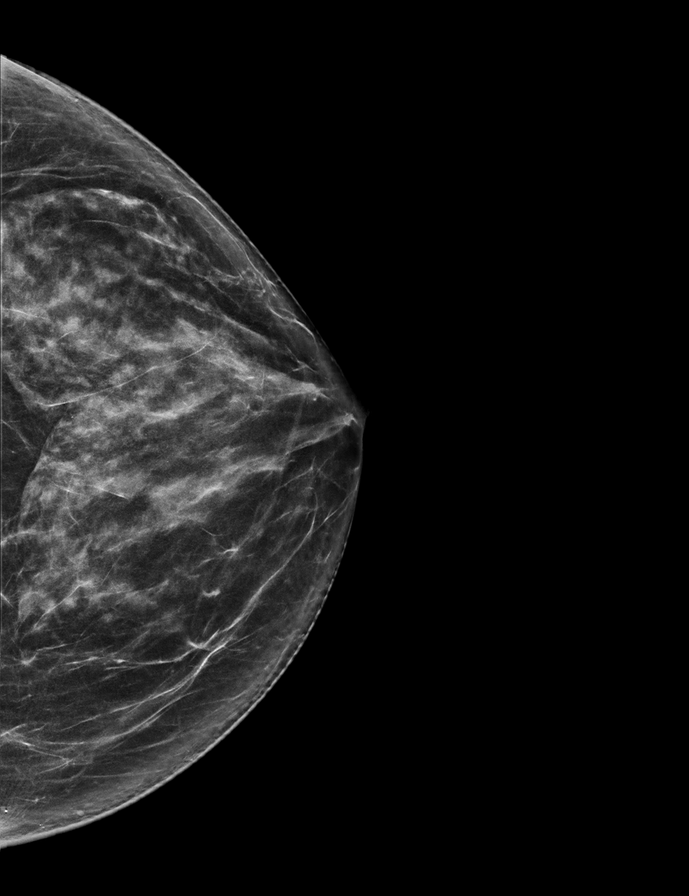

[L MLO tomo · 2 of 65 frames shown]
[frame 21/65]
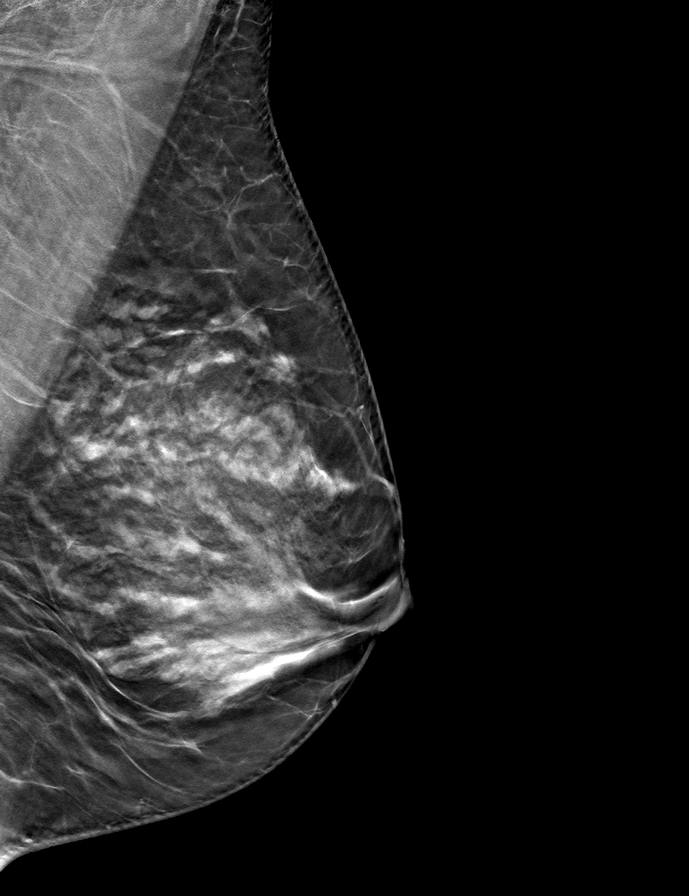
[frame 33/65]
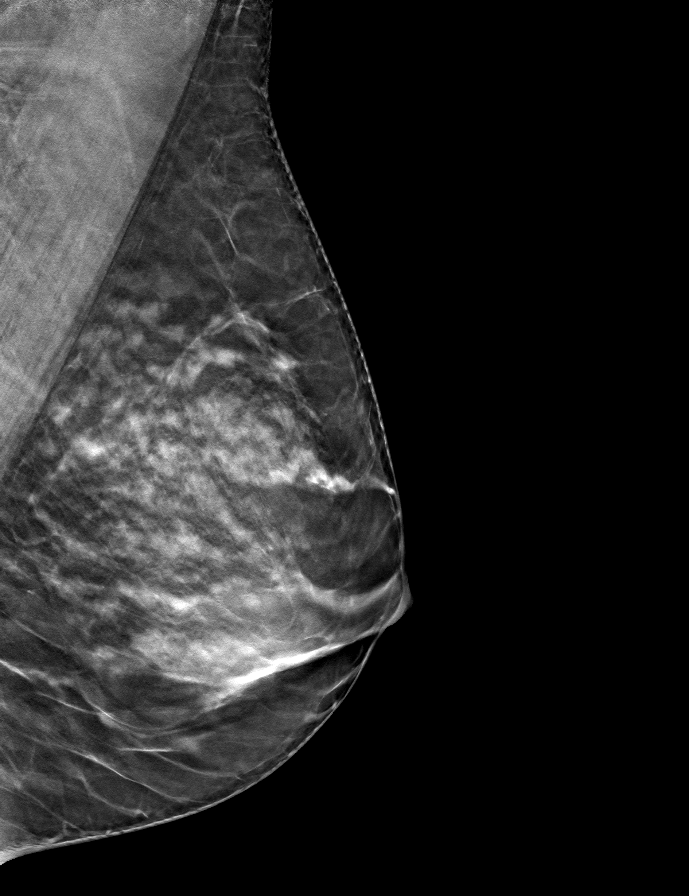

[L CC tomo · tomo slice 33/66.0]
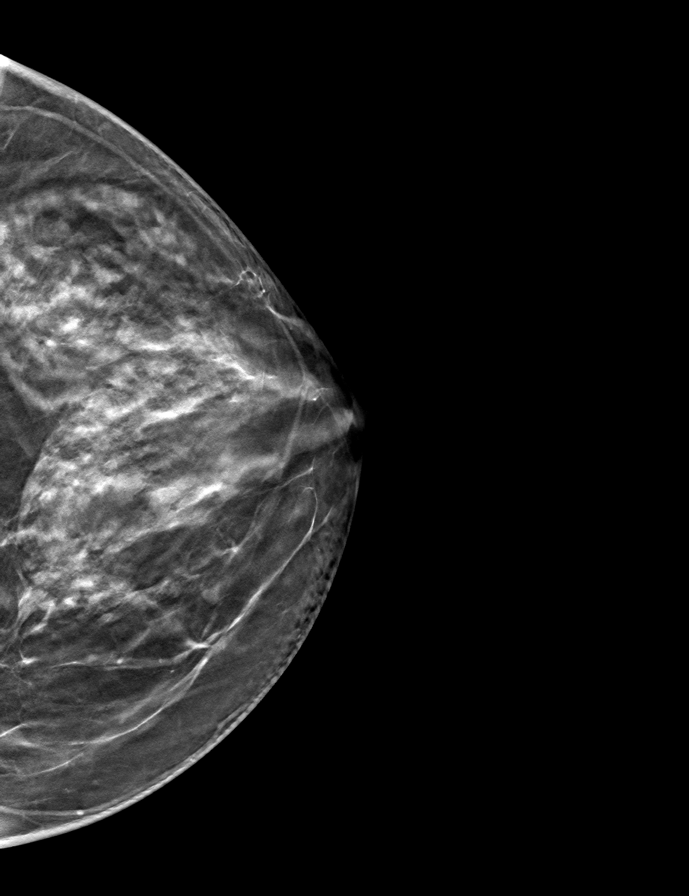

[R MLO tomo · tomo slice 35/70.0]
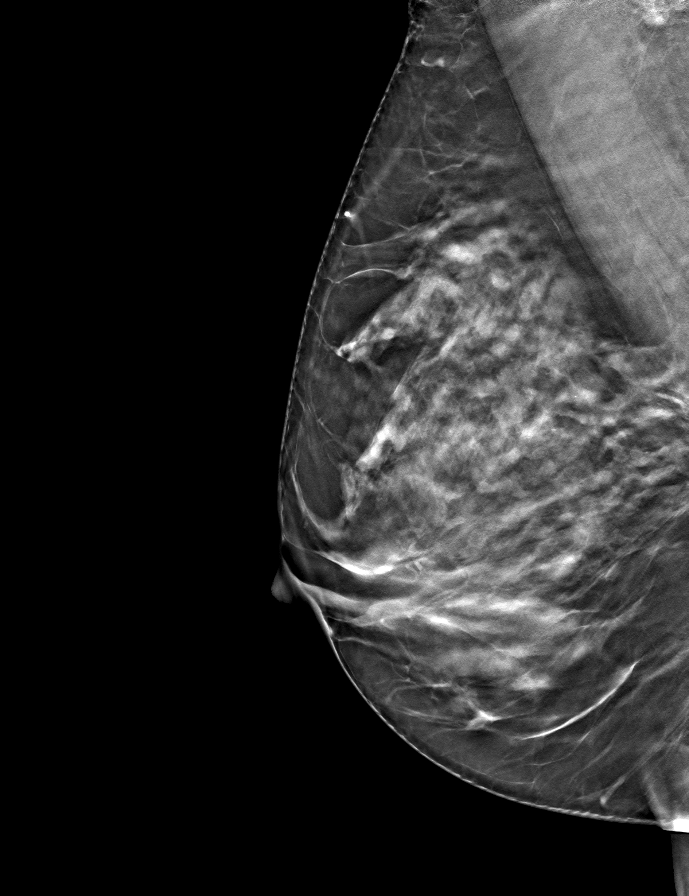

[R CC tomo · tomo slice 31/62.0]
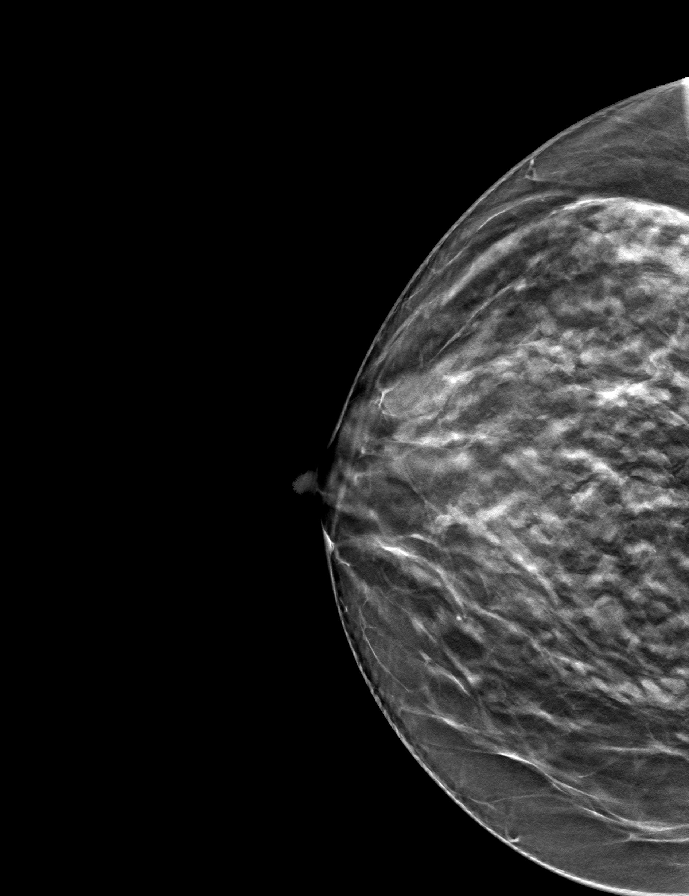

[9 of 24 positions shown; findings below may reference images not displayed]

ACR Breast Density Category c: The breast tissue is heterogeneously
dense, which may obscure small masses.
FINDINGS: There are no masses, areas of architectural distortion, areas of
significant asymmetry or suspicious calcifications. No mammographic
change.

Mammographic images were processed with CAD.

On physical exam, there is prominent fibroglandular tissue in the
upper outer aspects of both breasts, but no discrete masses.

Targeted right breast ultrasound is performed, showing normal
fibroglandular tissue throughout the upper outer quadrant. No mass,
cyst or suspicious lesion.
IMPRESSION: Normal exam.  No evidence of breast malignancy.

RECOMMENDATION:
Screening mammogram in one year.(Code:[EB])

I have discussed the findings and recommendations with the patient.
If applicable, a reminder letter will be sent to the patient
regarding the next appointment.

BI-RADS CATEGORY  1: Negative.

## 2020-08-12 MED ORDER — MECLIZINE HCL 25 MG PO TABS: 25.0000 mg | ORAL_TABLET | Freq: Three times a day (TID) | ORAL | 2 refills | Status: AC | PRN

## 2020-08-12 NOTE — Progress Notes (Signed)
Complete physical exam   Patient: Judith Fox   DOB: 08-12-1965   55 y.o. Female  MRN: [id number] Visit Date: 08/12/2020  Today's healthcare provider: Wilhemena Durie, MD   Chief Complaint  Patient presents with  . Annual Exam   Subjective    Judith Fox is a 55 y.o. female who presents today for a complete physical exam.  She reports consuming a general diet. The patient does not participate in regular exercise at present. She generally feels well. She reports sleeping well. She does have additional problems to discuss today.  She sees gynecology for her well woman exam. HPI  07/31/2019 Mammogram-BI-RADS 1 3 weeks ago she had a presyncopal episode which lasted just a couple of minutes and then about 2 weeks ago had another episode which lasted about a minute and a half.  No actual syncope.  She felt lightheaded with any movement.  Since then she has also noted some occasional nausea with this and some mild tinnitus. Also needs referral back to orthopedics for knee pain.   Past Medical History:  Diagnosis Date  . Allergy   . Arthritis    KNEE  . Asthma    "mild" per pt  . Complication of anesthesia    PT HAD A MI AFTER KNEE SURGERY IN 2014 FROM HIGH BLOOD PRESSURE-SURGERY WAS DONE AT AN OUTPATIENT SURGERY CENTER  . Diabetes mellitus without complication ([place])   . History of hiatal hernia   . History of kidney stones   . Hypertension   . Migraine   . Myocardial infarction ([place]) 2014   no stents  . Pneumonia    h/o   Past Surgical History:  Procedure Laterality Date  . ANAL FISSURE REPAIR    . INTRAUTERINE DEVICE (IUD) INSERTION N/A 11/02/2019   Procedure: INTRAUTERINE DEVICE (IUD) INSERTION - LILETTA;  Surgeon: Benjaman Kindler, MD;  Location: [facility];  Service: Gynecology;  Laterality: N/A;  . IUD REMOVAL N/A 11/02/2019   Procedure: INTRAUTERINE DEVICE (IUD) REMOVAL- MIRENA;  Surgeon: Benjaman Kindler, MD;  Location: [facility];  Service:  Gynecology;  Laterality: N/A;  . JOINT REPLACEMENT Right    knee replacement  . KNEE ARTHROSCOPY    . LAPAROSCOPIC OVARIAN CYSTECTOMY Right 11/02/2019   Procedure: LAPAROSCOPIC PERITUBALCYSTECTOMY;  Surgeon: Benjaman Kindler, MD;  Location: [facility];  Service: Gynecology;  Laterality: Right;  . LAPAROSCOPIC SALPINGO OOPHERECTOMY Left 11/02/2019   Procedure: LAPAROSCOPIC OOPHO;  Surgeon: Benjaman Kindler, MD;  Location: [facility];  Service: Gynecology;  Laterality: Left;  . LAPAROSCOPY N/A 11/02/2019   Procedure: LAPAROSCOPY DIAGNOSTIC WITH PELVIC WASHINGS;  Surgeon: Benjaman Kindler, MD;  Location: [facility];  Service: Gynecology;  Laterality: N/A;  . TONSILLECTOMY    . WISDOM TOOTH EXTRACTION     Social History   Socioeconomic History  . Marital status: Married    Spouse name: Not on file  . Number of children: Not on file  . Years of education: Not on file  . Highest education level: Not on file  Occupational History  . Not on file  Tobacco Use  . Smoking status: Never Smoker  . Smokeless tobacco: Never Used  Vaping Use  . Vaping Use: Never used  Substance and Sexual Activity  . Alcohol use: Yes    Alcohol/week: 2.0 standard drinks    Types: 1 Glasses of wine, 1 Shots of liquor per week    Comment: average varies, possibly once monthly  . Drug use: No  . Sexual  activity: Not on file  Other Topics Concern  . Not on file  Social History Narrative  . Not on file   Social Determinants of Health   Financial Resource Strain: Not on file  Food Insecurity: Not on file  Transportation Needs: Not on file  Physical Activity: Not on file  Stress: Not on file  Social Connections: Not on file  Intimate Partner Violence: Not on file   Family Status  Relation Name Status  . Mother  Alive  . Daughter  Alive       syringomyelia, hydromyelia  . Mat Aunt  (Not Specified)  . MGM  (Not Specified)  . Father  Deceased  . Brother Half Alive  . Daughter  Alive  . Cousin  (Not  Specified)  . Cousin  (Not Specified)   Family History  Problem Relation Age of Onset  . Migraines Mother   . Cancer Maternal Aunt        lung  . Cancer Maternal Grandmother        breast  . Diabetes Maternal Grandmother   . Breast cancer Maternal Grandmother 2058/12/16  . Cancer Father        died age 60 with metastatic cancer, uncertain origin  . Hypertension Brother   . Breast cancer Cousin 40       maternal  . Breast cancer Cousin 50       maternal   Allergies  Allergen Reactions  . Latex Rash and Swelling    Eye swelling   . Butorphanol Other (See Comments)    drowsiness Somnolence Pt "knocked out for 12 hrs" when taking this   . Lactose Intolerance (Gi) Other (See Comments)    Reflux and Indigestion    Patient Care Team: Jerrol Banana., MD as PCP - General (Family Medicine)   Medications: Outpatient Medications Prior to Visit  Medication Sig  . aspirin EC 81 MG tablet Take 81 mg by mouth daily.  Marland Kitchen atorvastatin (LIPITOR) 40 MG tablet TAKE 1 TABLET BY MOUTH EVERY DAY  . benazepril (LOTENSIN) 40 MG tablet TAKE 1 TABLET BY MOUTH EVERY DAY  . CALCIUM-VITAMIN D PO Take 2 tablets by mouth daily.   . fexofenadine (ALLEGRA) 180 MG tablet Take 180 mg by mouth in the morning.  . fluconazole (DIFLUCAN) 150 MG tablet Take 150 mg by mouth once a week. And prn  . glipiZIDE (GLUCOTROL) 10 MG tablet TAKE 1 TABLET (10 MG TOTAL) BY MOUTH DAILY BEFORE BREAKFAST.  Marland Kitchen ketorolac (TORADOL) 10 MG tablet Take 1 tablet (10 mg total) by mouth every 8 (eight) hours as needed for moderate pain (with food).  . metFORMIN (GLUCOPHAGE) 1000 MG tablet TAKE 1 TABLET BY MOUTH 2 TIMES DAILY WITH A MEAL.  Marland Kitchen metoCLOPramide (REGLAN) 5 MG/ML injection Inject 10 mg into the vein daily as needed (migraine).   . Multiple Vitamin (MULTIVITAMIN) capsule Take 1 capsule by mouth daily.  . naproxen (NAPROSYN) 500 MG tablet TAKE 1 TABLET BY MOUTH TWICE A DAY AS NEEDED  . nitroGLYCERIN (NITROSTAT) 0.4 MG SL  tablet Place 1 tablet (0.4 mg total) under the tongue every 5 (five) minutes as needed for chest pain.  Marland Kitchen omeprazole (PRILOSEC) 20 MG capsule TAKE 1 CAPSULE BY MOUTH 2 TIMES DAILY BEFORE A MEAL.  Marland Kitchen ondansetron (ZOFRAN ODT) 4 MG disintegrating tablet Take 1 tablet (4 mg total) by mouth every 8 (eight) hours as needed for nausea or vomiting.  . prochlorperazine (COMPAZINE) 10 MG tablet Take 1 tablet (  10 mg total) by mouth every 6 (six) hours as needed for nausea or vomiting (migraine).  . promethazine (PHENERGAN) 25 MG tablet Take 1 tablet (25 mg total) by mouth every 6 (six) hours as needed for nausea or vomiting.  . RESTASIS 0.05 % ophthalmic emulsion Place 1 drop into both eyes 2 (two) times daily.   Marland Kitchen triamcinolone cream (KENALOG) 0.1 % Apply topically.  . TRULICITY 0.75 MG/0.5ML SOPN INJECT 0.75 MG INTO THE SKIN ONCE A WEEK.  Marland Kitchen doxycycline (PERIOSTAT) 20 MG tablet TAKE 1 TABLET BY MOUTH TWICE A DAY  . doxycycline (VIBRA-TABS) 100 MG tablet Take 1 tablet (100 mg total) by mouth 2 (two) times daily.  Marland Kitchen levonorgestrel (LILETTA) 19.5 MCG/DAY IUD IUD by Intrauterine route.  Marland Kitchen oxyCODONE (OXY IR/ROXICODONE) 5 MG immediate release tablet Take 1 tablet (5 mg total) by mouth every 4 (four) hours as needed for severe pain. (Patient not taking: Reported on 08/12/2020)  . traMADol (ULTRAM) 50 MG tablet Take 50 mg by mouth every 6 (six) hours as needed. (Patient not taking: Reported on 08/12/2020)  . [DISCONTINUED] levonorgestrel (MIRENA) 20 MCG/24HR IUD 1 each by Intrauterine route once. (Patient not taking: No sig reported)   No facility-administered medications prior to visit.    Review of Systems  Constitutional: Positive for diaphoresis and fatigue.  HENT: Positive for hearing loss and tinnitus.   Eyes: Negative.   Respiratory: Negative.   Cardiovascular: Negative.   Gastrointestinal: Negative.   Endocrine: Negative.   Genitourinary: Negative.   Musculoskeletal: Negative.   Skin: Negative.    Allergic/Immunologic: Positive for environmental allergies.  Neurological: Positive for dizziness, light-headedness and headaches.  Hematological: Negative.   Psychiatric/Behavioral: Negative.     Last CBC Lab Results  Component Value Date   WBC 4.9 01/23/2020   HGB 11.5 (L) 01/23/2020   HCT 34.2 (L) 01/23/2020   MCV 83.8 01/23/2020   MCH 28.2 01/23/2020   RDW 13.3 01/23/2020   PLT 201 01/23/2020   Last metabolic panel Lab Results  Component Value Date   GLUCOSE 196 (H) 01/24/2020   NA 139 01/24/2020   K 3.7 01/24/2020   CL 106 01/24/2020   CO2 24 01/24/2020   BUN <5 (L) 01/24/2020   CREATININE 0.72 01/24/2020   GFRNONAA >60 01/24/2020   GFRAA >60 01/24/2020   CALCIUM 8.9 01/24/2020   PHOS 3.2 01/24/2020   PROT 6.1 (L) 01/24/2020   ALBUMIN 3.5 01/24/2020   LABGLOB 1.9 10/23/2019   AGRATIO 2.3 (H) 10/23/2019   BILITOT 0.8 01/24/2020   ALKPHOS 51 01/24/2020   AST 24 01/24/2020   ALT 20 01/24/2020   ANIONGAP 9 01/24/2020   Last lipids Lab Results  Component Value Date   CHOL 96 (L) 07/08/2020   HDL 51 07/08/2020   LDLCALC 32 07/08/2020   TRIG 56 07/08/2020   CHOLHDL 1.9 07/08/2020   Last hemoglobin A1c Lab Results  Component Value Date   HGBA1C 7.9 (A) 08/12/2020   Last thyroid functions Lab Results  Component Value Date   TSH 1.500 07/08/2020     Objective    BP 122/82 (BP Location: Right Arm, Patient Position: Sitting, Cuff Size: Normal)   Pulse 82   Temp 98.9 F (37.2 C) (Oral)   Resp 16   Ht 5\' 9"  (1.753 m)   Wt 188 lb (85.3 kg)   SpO2 98%   BMI 27.76 kg/m  BP Readings from Last 3 Encounters:  08/12/20 122/82  04/17/20 123/88  01/24/20 (!) 144/91  Wt Readings from Last 3 Encounters:  08/12/20 188 lb (85.3 kg)  04/17/20 188 lb (85.3 kg)  01/22/20 180 lb (81.6 kg)      Physical Exam Vitals reviewed.  Constitutional:      Appearance: Normal appearance. She is well-developed and normal weight.  HENT:     Head: Normocephalic.      Right Ear: External ear normal.     Left Ear: External ear normal.     Nose: Nose normal.     Mouth/Throat:     Pharynx: No oropharyngeal exudate.  Eyes:     General: No scleral icterus.    Conjunctiva/sclera: Conjunctivae normal.     Pupils: Pupils are equal, round, and reactive to light.  Cardiovascular:     Rate and Rhythm: Normal rate and regular rhythm.     Heart sounds: Normal heart sounds.  Pulmonary:     Effort: Pulmonary effort is normal. No respiratory distress.     Breath sounds: Normal breath sounds.  Abdominal:     Palpations: Abdomen is soft.  Musculoskeletal:        General: No tenderness.     Cervical back: Normal range of motion and neck supple.  Lymphadenopathy:     Cervical: No cervical adenopathy.  Skin:    General: Skin is warm and dry.     Comments: She has very fair skin without discernible lesion.  Neurological:     General: No focal deficit present.     Mental Status: She is alert and oriented to person, place, and time.     Comments: Some nystagmus noted.  Psychiatric:        Mood and Affect: Mood normal.        Behavior: Behavior normal.        Thought Content: Thought content normal.        Judgment: Judgment normal.       Last depression screening scores PHQ 2/9 Scores 08/12/2020 12/13/2019 11/22/2017  PHQ - 2 Score 0 0 0  PHQ- 9 Score 0 0 -   Last fall risk screening Fall Risk  08/12/2020  Falls in the past year? 0  Number falls in past yr: 0  Injury with Fall? 0  Risk for fall due to : No Fall Risks  Follow up Falls evaluation completed   Last Audit-C alcohol use screening Alcohol Use Disorder Test (AUDIT) 08/12/2020  1. How often do you have a drink containing alcohol? 0  2. How many drinks containing alcohol do you have on a typical day when you are drinking? 0  3. How often do you have six or more drinks on one occasion? 0  AUDIT-C Score 0  Alcohol Brief Interventions/Follow-up AUDIT Score <7 follow-up not indicated   A score of 3  or more in women, and 4 or more in men indicates increased risk for alcohol abuse, EXCEPT if all of the points are from question 1   Results for orders placed or performed in visit on 08/12/20  POCT glycosylated hemoglobin (Hb A1C)  Result Value Ref Range   Hemoglobin A1C 7.9 (A) 4.0 - 5.6 %   Est. average glucose Bld gHb Est-mCnc 180     Assessment & Plan    Routine Health Maintenance and Physical Exam  Exercise Activities and Dietary recommendations Goals   None     Immunization History  Administered Date(s) Administered  . Influenza, Quadrivalent, Recombinant, Inj, Pf 06/15/2019  . Influenza,inj,Quad PF,6+ Mos 05/25/2018  . Influenza-Unspecified 05/18/2017  .  PFIZER SARS-COV-2 Vaccination 10/27/2019, 11/20/2019, 06/03/2020    Health Maintenance  Topic Date Due  . Hepatitis C Screening  Never done  . PNEUMOCOCCAL POLYSACCHARIDE VACCINE AGE 45-64 HIGH RISK  Never done  . TETANUS/TDAP  Never done  . PAP SMEAR-Modifier  Never done  . COLONOSCOPY (Pts 45-62yrs Insurance coverage will need to be confirmed)  Never done  . FOOT EXAM  12/25/2019  . INFLUENZA VACCINE  03/09/2020  . HEMOGLOBIN A1C  02/09/2021  . OPHTHALMOLOGY EXAM  07/01/2021  . MAMMOGRAM  07/30/2021  . COVID-19 Vaccine  Completed  . HIV Screening  Completed    Discussed health benefits of physical activity, and encouraged her to engage in regular exercise appropriate for her age and condition.  1. Encounter for annual health examination Well woman exam per Dr. Leafy Ro. To dermatology due to very fair skin.  She has been followed by Derm in the past. 2. Type 2 diabetes mellitus without complication, without long-term current use of insulin (HCC) A1c today is 7.9 which is stable.  Goal less than 7.  Ready to work on diet and exercise. - POCT glycosylated hemoglobin (Hb A1C)  3. Essential hypertension Good control.  4. Mixed hyperlipidemia On atorvastatin 40  5. Vertigo Try meclizine 25 mg every 8 hours  as needed.  May need ENT referral if this persist.  He also has a little tinnitus so she might have early Mnire's.  6. Primary hypertension   7. History of non-ST elevation myocardial infarction (NSTEMI) Risk factors treated  8. History of total knee replacement, unspecified laterality For about orthopedics.   No follow-ups on file.     I, Wilhemena Durie, MD, have reviewed all documentation for this visit. The documentation on 08/15/20 for the exam, diagnosis, procedures, and orders are all accurate and complete.    Kamsiyochukwu Buist Cranford Mon, MD  [facility] [phone] (phone) [phone] (fax)  [place]

## 2020-08-14 ENCOUNTER — Encounter: Payer: Self-pay | Admitting: Family Medicine

## 2020-08-16 ENCOUNTER — Other Ambulatory Visit: Payer: Self-pay | Admitting: Family Medicine

## 2020-08-16 DIAGNOSIS — L719 Rosacea, unspecified: Secondary | ICD-10-CM

## 2020-08-16 DIAGNOSIS — E119 Type 2 diabetes mellitus without complications: Secondary | ICD-10-CM

## 2020-08-27 ENCOUNTER — Ambulatory Visit (INDEPENDENT_AMBULATORY_CARE_PROVIDER_SITE_OTHER): Payer: 59 | Admitting: Family Medicine

## 2020-08-27 ENCOUNTER — Other Ambulatory Visit: Payer: Self-pay | Admitting: *Deleted

## 2020-08-27 ENCOUNTER — Encounter: Payer: Self-pay | Admitting: Family Medicine

## 2020-08-27 VITALS — Temp 98.9°F

## 2020-08-27 DIAGNOSIS — K219 Gastro-esophageal reflux disease without esophagitis: Secondary | ICD-10-CM

## 2020-08-27 DIAGNOSIS — J0140 Acute pansinusitis, unspecified: Secondary | ICD-10-CM

## 2020-08-27 DIAGNOSIS — L719 Rosacea, unspecified: Secondary | ICD-10-CM

## 2020-08-27 DIAGNOSIS — E119 Type 2 diabetes mellitus without complications: Secondary | ICD-10-CM

## 2020-08-27 DIAGNOSIS — J069 Acute upper respiratory infection, unspecified: Secondary | ICD-10-CM | POA: Diagnosis not present

## 2020-08-27 DIAGNOSIS — I519 Heart disease, unspecified: Secondary | ICD-10-CM

## 2020-08-27 MED ORDER — OMEPRAZOLE 20 MG PO CPDR: DELAYED_RELEASE_CAPSULE | ORAL | 1 refills | Status: DC

## 2020-08-27 MED ORDER — AZITHROMYCIN 250 MG PO TABS: ORAL_TABLET | ORAL | 0 refills | Status: DC

## 2020-08-27 MED ORDER — NAPROXEN 500 MG PO TABS: 500.0000 mg | ORAL_TABLET | Freq: Two times a day (BID) | ORAL | 1 refills | Status: DC | PRN

## 2020-08-27 MED ORDER — ATORVASTATIN CALCIUM 40 MG PO TABS: 40.0000 mg | ORAL_TABLET | Freq: Every day | ORAL | 1 refills | Status: DC

## 2020-08-27 MED ORDER — GLIPIZIDE 10 MG PO TABS: 10.0000 mg | ORAL_TABLET | Freq: Every day | ORAL | 3 refills | Status: DC

## 2020-08-27 MED ORDER — NITROGLYCERIN 0.4 MG SL SUBL: 0.4000 mg | SUBLINGUAL_TABLET | SUBLINGUAL | 3 refills | Status: DC | PRN

## 2020-08-27 MED ORDER — DOXYCYCLINE HYCLATE 20 MG PO TABS: 20.0000 mg | ORAL_TABLET | Freq: Two times a day (BID) | ORAL | 0 refills | Status: DC

## 2020-08-27 MED ORDER — BENAZEPRIL HCL 40 MG PO TABS: 40.0000 mg | ORAL_TABLET | Freq: Every day | ORAL | 1 refills | Status: DC

## 2020-08-27 MED ORDER — TRULICITY 0.75 MG/0.5ML ~~LOC~~ SOAJ: 0.7500 mg | SUBCUTANEOUS | 3 refills | Status: DC

## 2020-08-27 MED ORDER — METFORMIN HCL 1000 MG PO TABS: ORAL_TABLET | ORAL | 1 refills | Status: DC

## 2020-08-27 NOTE — Progress Notes (Signed)
I,April Miller,acting as a scribe for Wilhemena Durie, MD.,have documented all relevant documentation on the behalf of Wilhemena Durie, MD,as directed by  Wilhemena Durie, MD while in the presence of Wilhemena Durie, MD.  Virtual telephone visit    Virtual Visit via Telephone Note   This visit type was conducted due to national recommendations for restrictions regarding the COVID-19 Pandemic (e.g. social distancing) in an effort to limit this patient's exposure and mitigate transmission in our community. Due to her co-morbid illnesses, this patient is at least at moderate risk for complications without adequate follow up. This format is felt to be most appropriate for this patient at this time. The patient did not have access to video technology or had technical difficulties with video requiring transitioning to audio format only (telephone). Physical exam was limited to content and character of the telephone converstion.    Patient location: home Provider location: office  I discussed the limitations of evaluation and management by telemedicine and the availability of in person appointments. The patient expressed understanding and agreed to proceed.   Visit Date: 08/27/2020  Today's healthcare provider: Wilhemena Durie, MD   No chief complaint on file.  Subjective    HPI   Patient started having symptoms on January 8 with URIs which is gradually worsened.  She has had low-grade fever chills and now has much worsening sinus pressure sore throat and a cough.  The mucus from her sinuses is much thicker and discolored.  She has had a negative rapid COVID test on 1/10 and 1/16. She called into a virtual call center and got amoxicillin sent in 4 days ago.  She is not feeling any better  She states last year the Z-Pak helped her sinus infection.     Medications: Outpatient Medications Prior to Visit  Medication Sig  . aspirin EC 81 MG tablet Take 81 mg by mouth daily.   Marland Kitchen CALCIUM-VITAMIN D PO Take 2 tablets by mouth daily.   Marland Kitchen doxycycline (VIBRA-TABS) 100 MG tablet Take 1 tablet (100 mg total) by mouth 2 (two) times daily.  . fexofenadine (ALLEGRA) 180 MG tablet Take 180 mg by mouth in the morning.  . fluconazole (DIFLUCAN) 150 MG tablet Take 150 mg by mouth once a week. And prn  . ketorolac (TORADOL) 10 MG tablet Take 1 tablet (10 mg total) by mouth every 8 (eight) hours as needed for moderate pain (with food).  Marland Kitchen levonorgestrel (LILETTA) 19.5 MCG/DAY IUD IUD by Intrauterine route.  . meclizine (ANTIVERT) 25 MG tablet Take 1 tablet (25 mg total) by mouth 3 (three) times daily as needed for dizziness.  . metoCLOPramide (REGLAN) 5 MG/ML injection Inject 10 mg into the vein daily as needed (migraine).   . Multiple Vitamin (MULTIVITAMIN) capsule Take 1 capsule by mouth daily.  . ondansetron (ZOFRAN ODT) 4 MG disintegrating tablet Take 1 tablet (4 mg total) by mouth every 8 (eight) hours as needed for nausea or vomiting.  Marland Kitchen oxyCODONE (OXY IR/ROXICODONE) 5 MG immediate release tablet Take 1 tablet (5 mg total) by mouth every 4 (four) hours as needed for severe pain.  Marland Kitchen prochlorperazine (COMPAZINE) 10 MG tablet Take 1 tablet (10 mg total) by mouth every 6 (six) hours as needed for nausea or vomiting (migraine).  . promethazine (PHENERGAN) 25 MG tablet Take 1 tablet (25 mg total) by mouth every 6 (six) hours as needed for nausea or vomiting.  . RESTASIS 0.05 % ophthalmic emulsion Place 1 drop  into both eyes 2 (two) times daily.   . traMADol (ULTRAM) 50 MG tablet Take 50 mg by mouth every 6 (six) hours as needed.  . triamcinolone cream (KENALOG) 0.1 % Apply topically.  . [DISCONTINUED] atorvastatin (LIPITOR) 40 MG tablet TAKE 1 TABLET BY MOUTH EVERY DAY  . [DISCONTINUED] benazepril (LOTENSIN) 40 MG tablet TAKE 1 TABLET BY MOUTH EVERY DAY  . [DISCONTINUED] doxycycline (PERIOSTAT) 20 MG tablet TAKE 1 TABLET BY MOUTH TWICE A DAY  . [DISCONTINUED] glipiZIDE (GLUCOTROL) 10  MG tablet TAKE 1 TABLET (10 MG TOTAL) BY MOUTH DAILY BEFORE BREAKFAST.  . [DISCONTINUED] metFORMIN (GLUCOPHAGE) 1000 MG tablet TAKE 1 TABLET BY MOUTH 2 TIMES DAILY WITH A MEAL.  . [DISCONTINUED] naproxen (NAPROSYN) 500 MG tablet TAKE 1 TABLET BY MOUTH TWICE A DAY AS NEEDED  . [DISCONTINUED] nitroGLYCERIN (NITROSTAT) 0.4 MG SL tablet Place 1 tablet (0.4 mg total) under the tongue every 5 (five) minutes as needed for chest pain.  . [DISCONTINUED] omeprazole (PRILOSEC) 20 MG capsule TAKE 1 CAPSULE BY MOUTH 2 TIMES DAILY BEFORE A MEAL.  . [DISCONTINUED] TRULICITY 6.54 YT/0.3TW SOPN INJECT 0.75 MG INTO THE SKIN ONCE A WEEK.   No facility-administered medications prior to visit.    Review of Systems     Objective    Temp 98.9 F (37.2 C)  She is alert and oriented.  She is answering in patients with complete sentences and has no shortness of breath but she also obviously has a lot of nasal congestion.    Assessment & Plan     1. Subacute pansinusitis Due to confluence of symptoms will obtain COVID testing flu A and B testing. Pending these results we will treat possible sinusitis with Z-Pak.  Follow-up if she worsens.  - COVID-19, Flu A+B and RSV  2. Viral URI  - COVID-19, Flu A+B and RSV  3. Type 2 diabetes mellitus without complication, without long-term current use of insulin (HCC)    No follow-ups on file.    I discussed the assessment and treatment plan with the patient. The patient was provided an opportunity to ask questions and all were answered. The patient agreed with the plan and demonstrated an understanding of the instructions.   The patient was advised to call back or seek an in-person evaluation if the symptoms worsen or if the condition fails to improve as anticipated.  I provided 10 minutes of non-face-to-face time during this encounter.    Frederich Montilla Cranford Mon, MD [facility] [phone] (phone) [phone] (fax)  [place]

## 2020-08-29 LAB — COVID-19, FLU A+B AND RSV
Influenza A, NAA: NOT DETECTED
Influenza B, NAA: NOT DETECTED
RSV, NAA: NOT DETECTED
SARS-CoV-2, NAA: NOT DETECTED

## 2020-09-02 ENCOUNTER — Other Ambulatory Visit: Payer: Self-pay | Admitting: *Deleted

## 2020-09-02 DIAGNOSIS — Z1283 Encounter for screening for malignant neoplasm of skin: Secondary | ICD-10-CM

## 2020-09-02 DIAGNOSIS — G8929 Other chronic pain: Secondary | ICD-10-CM

## 2020-09-02 DIAGNOSIS — Z1211 Encounter for screening for malignant neoplasm of colon: Secondary | ICD-10-CM

## 2020-09-02 DIAGNOSIS — Z96659 Presence of unspecified artificial knee joint: Secondary | ICD-10-CM

## 2020-09-04 ENCOUNTER — Telehealth (INDEPENDENT_AMBULATORY_CARE_PROVIDER_SITE_OTHER): Payer: Self-pay | Admitting: Gastroenterology

## 2020-09-04 ENCOUNTER — Other Ambulatory Visit: Payer: Self-pay

## 2020-09-04 DIAGNOSIS — Z1211 Encounter for screening for malignant neoplasm of colon: Secondary | ICD-10-CM

## 2020-09-04 MED ORDER — NA SULFATE-K SULFATE-MG SULF 17.5-3.13-1.6 GM/177ML PO SOLN: 1.0000 | Freq: Once | ORAL | 0 refills | Status: AC

## 2020-09-04 NOTE — Progress Notes (Signed)
Patients colonoscopy was over 20 years ago.  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  Pt states that she was told her heart attack was anesthesia related.    She would like to know if her heart will be monitored during the process of having her colonoscopy.  How will this be addressed to make sure it doesn't happen again. And wants to ensure that the physician doing the colonoscopy is 100% comfortable performing her colonoscopy.    Gastroenterology Pre-Procedure Review  Request Date: Thursday 10/16/20 Requesting Physician: Dr. Vicente Males  PATIENT REVIEW QUESTIONS: The patient responded to the following health history questions as indicated:    1. Are you having any GI issues? no 2. Do you have a personal history of Polyps? no 3. Do you have a family history of Colon Cancer or Polyps? yes (father colon cancer possibly) 4. Diabetes Mellitus? yes (type 2) 5. Joint replacements in the past 12 months?no 6. Major health problems in the past 3 months?no major health problems iin the past year. Ovaries were removed last spring or winter. 7. Any artificial heart valves, MVP, or defibrillator?no    MEDICATIONS & ALLERGIES:    Patient reports the following regarding taking any anticoagulation/antiplatelet therapy:   Plavix, Coumadin, Eliquis, Xarelto, Lovenox, Pradaxa, Brilinta, or Effient? no Aspirin? yes (81 mg daily)  Patient confirms/reports the following medications:  Current Outpatient Medications  Medication Sig Dispense Refill  . aspirin EC 81 MG tablet Take 81 mg by mouth daily.    Marland Kitchen atorvastatin (LIPITOR) 40 MG tablet Take 1 tablet (40 mg total) by mouth daily. 90 tablet 1  . benazepril (LOTENSIN) 40 MG tablet Take 1 tablet (40 mg total) by mouth daily. 90 tablet 1  . CALCIUM-VITAMIN D PO Take 2 tablets by mouth daily.     Marland Kitchen doxycycline  (PERIOSTAT) 20 MG tablet Take 1 tablet (20 mg total) by mouth 2 (two) times daily. 180 tablet 0  . Dulaglutide (TRULICITY) 5.78 IO/9.6EX SOPN Inject 0.75 mg into the skin once a week. 0.5 mL 3  . fexofenadine (ALLEGRA) 180 MG tablet Take 180 mg by mouth in the morning.    . fluconazole (DIFLUCAN) 150 MG tablet Take 150 mg by mouth once a week. And prn    . glipiZIDE (GLUCOTROL) 10 MG tablet Take 1 tablet (10 mg total) by mouth daily before breakfast. 90 tablet 3  . ketorolac (TORADOL) 10 MG tablet Take 1 tablet (10 mg total) by mouth every 8 (eight) hours as needed for moderate pain (with food). 15 tablet 0  . levonorgestrel (LILETTA) 19.5 MCG/DAY IUD IUD by Intrauterine route.    . meclizine (ANTIVERT) 25 MG tablet Take 1 tablet (25 mg total) by mouth 3 (three) times daily as needed for dizziness. 30 tablet 2  . metFORMIN (GLUCOPHAGE) 1000 MG tablet TAKE 1 TABLET BY MOUTH 2 TIMES DAILY WITH A MEAL. 180 tablet 1  . metoCLOPramide (REGLAN) 5 MG/ML injection Inject 10 mg into the vein daily as needed (migraine).     . Multiple Vitamin (MULTIVITAMIN) capsule Take 1 capsule by mouth daily.    . naproxen (NAPROSYN) 500 MG tablet Take 1 tablet (500 mg total) by mouth 2 (two) times daily as needed. 180 tablet 1  . nitroGLYCERIN (NITROSTAT) 0.4 MG SL tablet Place 1 tablet (0.4 mg total) under the tongue every 5 (  five) minutes as needed for chest pain. 50 tablet 3  . omeprazole (PRILOSEC) 20 MG capsule TAKE 1 CAPSULE BY MOUTH 2 TIMES DAILY BEFORE A MEAL. 180 capsule 1  . ondansetron (ZOFRAN ODT) 4 MG disintegrating tablet Take 1 tablet (4 mg total) by mouth every 8 (eight) hours as needed for nausea or vomiting. 20 tablet 0  . prochlorperazine (COMPAZINE) 10 MG tablet Take 1 tablet (10 mg total) by mouth every 6 (six) hours as needed for nausea or vomiting (migraine). 15 tablet 0  . promethazine (PHENERGAN) 25 MG tablet Take 1 tablet (25 mg total) by mouth every 6 (six) hours as needed for nausea or vomiting.  35 tablet 0  . RESTASIS 0.05 % ophthalmic emulsion Place 1 drop into both eyes 2 (two) times daily.     Marland Kitchen azithromycin (ZITHROMAX) 250 MG tablet UAD (Patient not taking: Reported on 09/04/2020) 6 tablet 0  . doxycycline (VIBRA-TABS) 100 MG tablet Take 1 tablet (100 mg total) by mouth 2 (two) times daily. (Patient not taking: Reported on 09/04/2020) 20 tablet 0  . oxyCODONE (OXY IR/ROXICODONE) 5 MG immediate release tablet Take 1 tablet (5 mg total) by mouth every 4 (four) hours as needed for severe pain. (Patient not taking: Reported on 09/04/2020) 20 tablet 0  . traMADol (ULTRAM) 50 MG tablet Take 50 mg by mouth every 6 (six) hours as needed. (Patient not taking: Reported on 09/04/2020)    . triamcinolone cream (KENALOG) 0.1 % Apply topically. (Patient not taking: Reported on 09/04/2020)     No current facility-administered medications for this visit.    Patient confirms/reports the following allergies:  Allergies  Allergen Reactions  . Latex Rash and Swelling    Eye swelling   . Butorphanol Other (See Comments)    drowsiness Somnolence Pt "knocked out for 12 hrs" when taking this   . Lactose Intolerance (Gi) Other (See Comments)    Reflux and Indigestion    No orders of the defined types were placed in this encounter.   AUTHORIZATION INFORMATION Primary Insurance: 1D#: Group #:  Secondary Insurance: 1D#: Group #:  SCHEDULE INFORMATION: Date: 10/16/20 Time: Location:[facility]

## 2020-09-09 ENCOUNTER — Other Ambulatory Visit: Payer: Self-pay | Admitting: Orthopedic Surgery

## 2020-09-09 ENCOUNTER — Other Ambulatory Visit: Payer: Self-pay | Admitting: Family Medicine

## 2020-09-09 DIAGNOSIS — M25312 Other instability, left shoulder: Secondary | ICD-10-CM

## 2020-09-09 DIAGNOSIS — M1652 Unilateral post-traumatic osteoarthritis, left hip: Secondary | ICD-10-CM

## 2020-09-09 DIAGNOSIS — Z8781 Personal history of (healed) traumatic fracture: Secondary | ICD-10-CM

## 2020-09-09 DIAGNOSIS — E119 Type 2 diabetes mellitus without complications: Secondary | ICD-10-CM

## 2020-09-09 DIAGNOSIS — G8929 Other chronic pain: Secondary | ICD-10-CM

## 2020-09-09 DIAGNOSIS — M25552 Pain in left hip: Secondary | ICD-10-CM

## 2020-09-09 DIAGNOSIS — M25512 Pain in left shoulder: Secondary | ICD-10-CM

## 2020-09-09 MED ORDER — TRULICITY 0.75 MG/0.5ML ~~LOC~~ SOAJ: 0.7500 mg | SUBCUTANEOUS | 0 refills | Status: DC

## 2020-09-09 NOTE — Telephone Encounter (Signed)
Dulaglutide (TRULICITY) 9.38 BO/1.7PZ SOPN  Pt is requesting a refill for 90 days supply! She only has 1 more dose left    [id number] [place], [place]  [address] [place] [address]  Phone: [phone] Fax: [phone]

## 2020-09-15 ENCOUNTER — Telehealth: Payer: Self-pay | Admitting: *Deleted

## 2020-09-15 NOTE — Telephone Encounter (Signed)
Patient called and reports she has not heard from [place] in regards to her needing cardiac clearance and having an appointment to discuss cardiac clearance with Dr. Vicente Males. Patient reports she wants to know if we can contact Dr. Ubaldo Glassing (cardiologist) to see if she is cleared for this as we have done this for her husband in the past. Her last OV to Dr. Ubaldo Glassing was spring 2021. Patient may also want to move procedure back 1 week.

## 2020-09-17 NOTE — Telephone Encounter (Signed)
Looks like you scheduled this patients procedure. She is asking about cardiac clearance. Please advise.

## 2020-09-18 ENCOUNTER — Ambulatory Visit
Admission: RE | Admit: 2020-09-18 | Discharge: 2020-09-18 | Disposition: A | Discharge status: Home / Self Care | Payer: 59 | Source: Ambulatory Visit | Attending: Orthopedic Surgery | Admitting: Orthopedic Surgery

## 2020-09-18 ENCOUNTER — Ambulatory Visit (INDEPENDENT_AMBULATORY_CARE_PROVIDER_SITE_OTHER): Payer: 59 | Admitting: Family Medicine

## 2020-09-18 ENCOUNTER — Encounter: Payer: Self-pay | Admitting: Family Medicine

## 2020-09-18 ENCOUNTER — Other Ambulatory Visit: Payer: Self-pay

## 2020-09-18 DIAGNOSIS — J329 Chronic sinusitis, unspecified: Secondary | ICD-10-CM

## 2020-09-18 DIAGNOSIS — M25312 Other instability, left shoulder: Secondary | ICD-10-CM | POA: Insufficient documentation

## 2020-09-18 DIAGNOSIS — M25552 Pain in left hip: Secondary | ICD-10-CM | POA: Diagnosis present

## 2020-09-18 DIAGNOSIS — M25512 Pain in left shoulder: Secondary | ICD-10-CM | POA: Insufficient documentation

## 2020-09-18 DIAGNOSIS — G8929 Other chronic pain: Secondary | ICD-10-CM

## 2020-09-18 DIAGNOSIS — M1652 Unilateral post-traumatic osteoarthritis, left hip: Secondary | ICD-10-CM | POA: Diagnosis present

## 2020-09-18 DIAGNOSIS — Z8781 Personal history of (healed) traumatic fracture: Secondary | ICD-10-CM | POA: Diagnosis present

## 2020-09-18 IMAGING — MR MR HIP*L* W/O CM
5 series · 40 of 40 positions shown · non-contrast
Comparison: None.

CLINICAL DATA: Left hip pain and difficulty walking. No recent
injury.

EXAM:
MR OF THE LEFT HIP WITHOUT CONTRAST
TECHNIQUE: Multiplanar, multisequence MR imaging was performed. No intravenous
contrast was administered.

[Series 16: T1 · coronal · left · 4.0mm · 1.19mm/px · 9 of 40 slices shown]
[im 1/40]
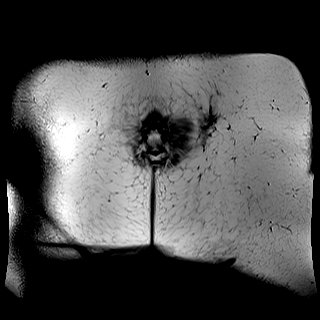
[im 5/40]
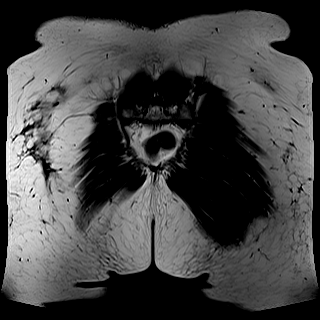
[im 10/40]
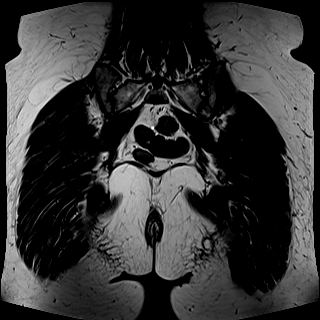
[im 15/40]
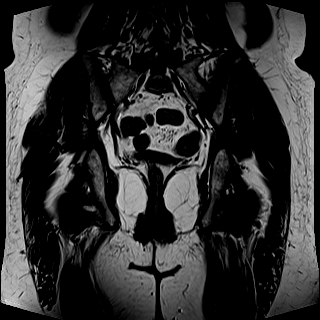
[im 20/40]
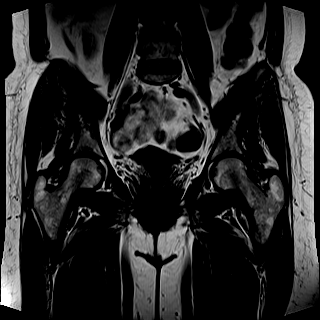
[im 25/40]
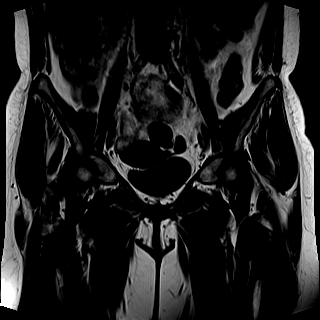
[im 30/40]
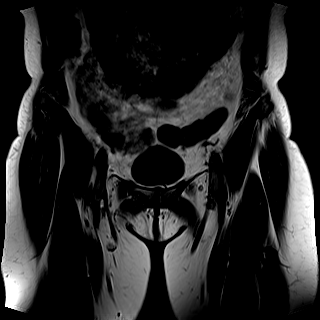
[im 35/40]
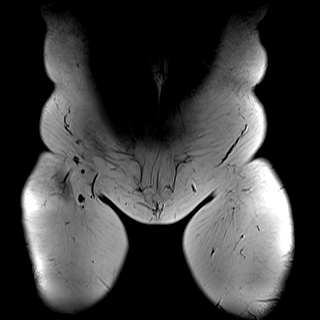
[im 40/40]
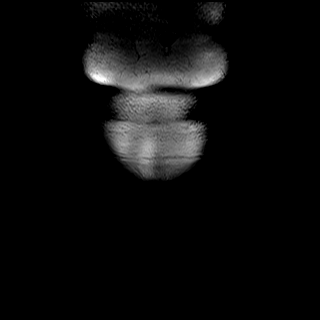

[Series 17: STIR · coronal · left · 4.0mm · 1.19mm/px · 10 of 40 slices shown]
[im 1/40]
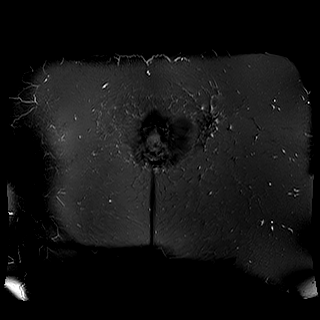
[im 5/40]
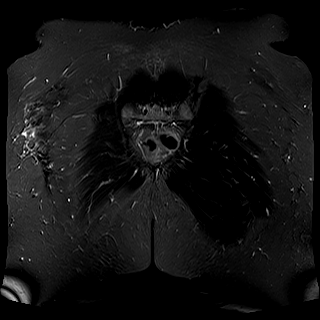
[im 9/40]
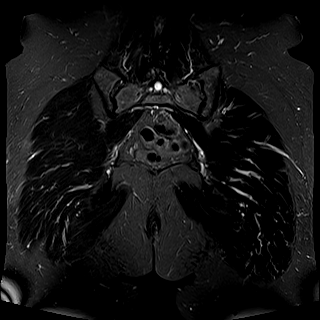
[im 14/40]
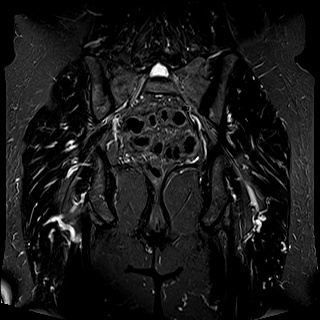
[im 18/40]
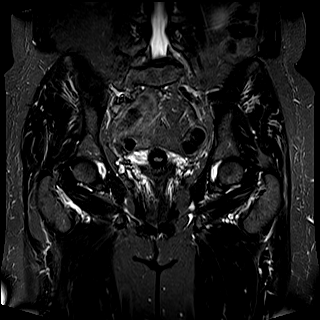
[im 22/40]
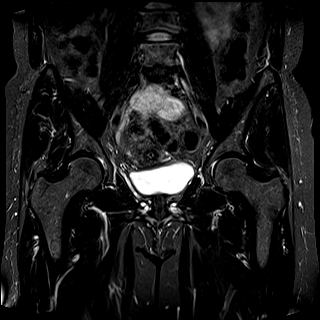
[im 27/40]
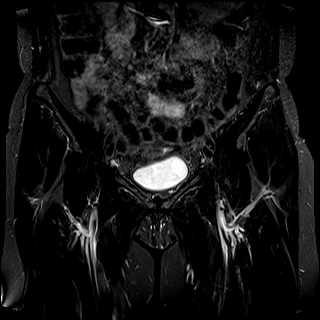
[im 31/40]
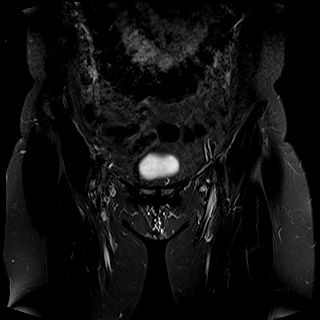
[im 35/40]
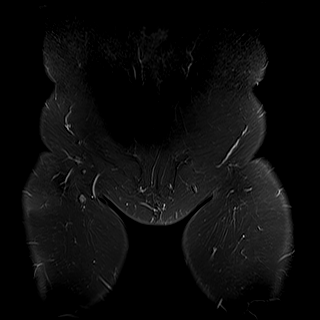
[im 40/40]
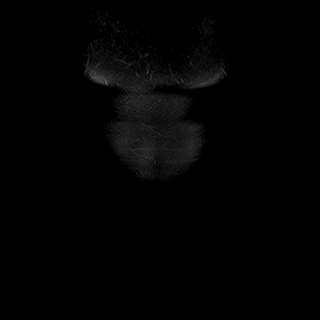

[Series 18: T2 fat-sat · axial · left · 4.0mm · 0.70mm/px · z∈[-227,-82]mm · 7 of 30 slices shown]
[im 1/30]
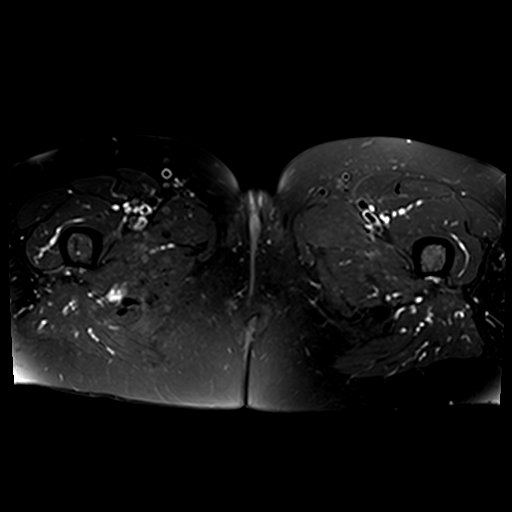
[im 5/30]
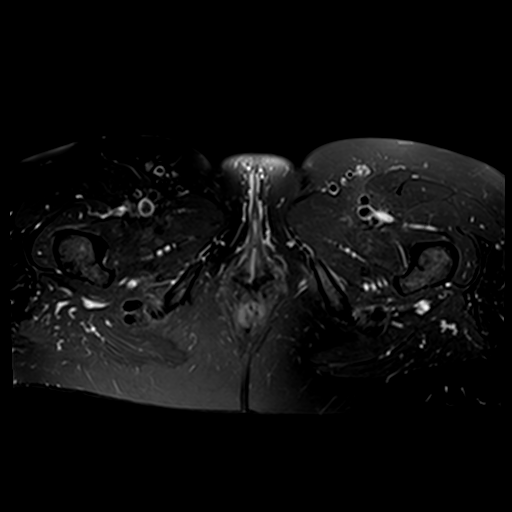
[im 10/30]
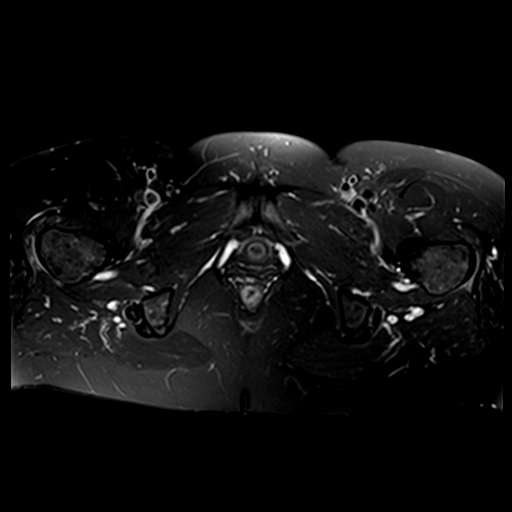
[im 15/30]
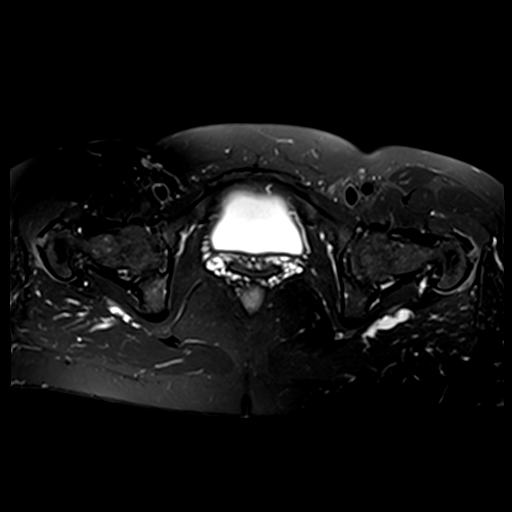
[im 20/30]
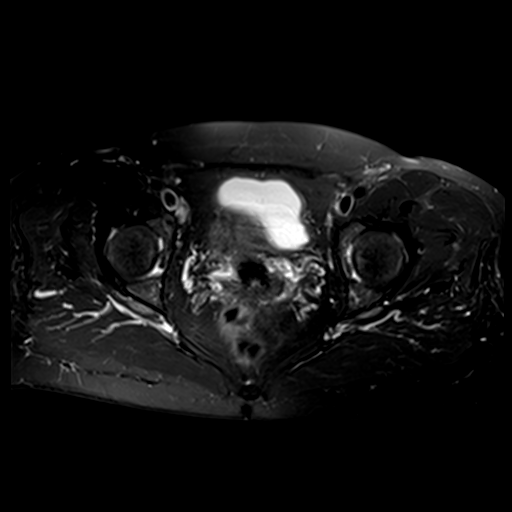
[im 25/30]
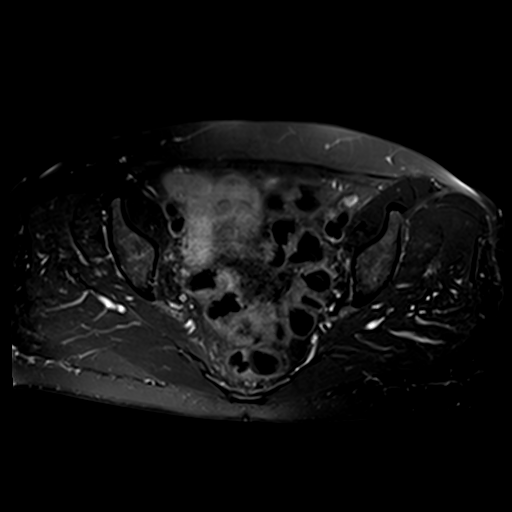
[im 30/30]
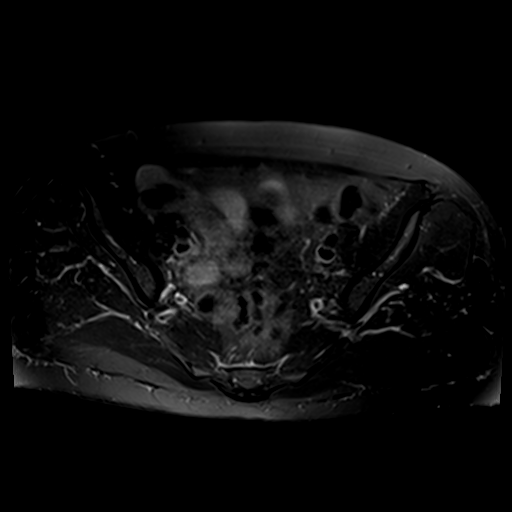

[Series 19: PD fat-sat · sagittal · left · 4.0mm · 0.70mm/px · 7 of 29 slices shown (1 of 2)]
[im 1/29]
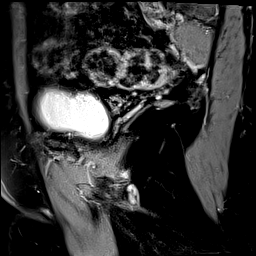
[im 5/29]
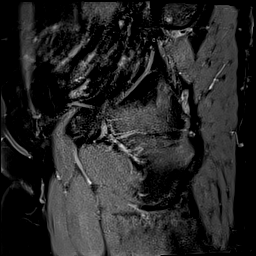
[im 10/29]
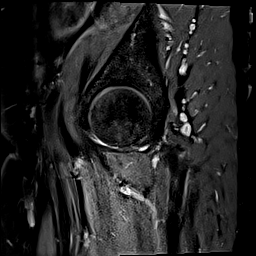
[im 15/29]
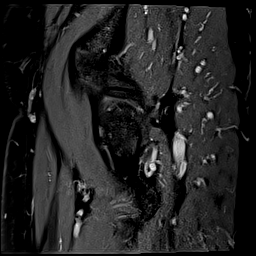
[im 19/29]
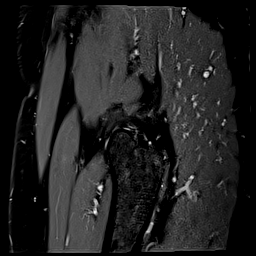
[im 24/29]
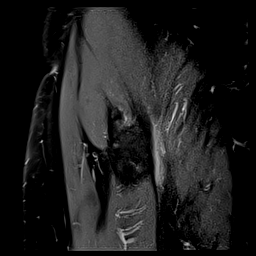
[im 29/29]
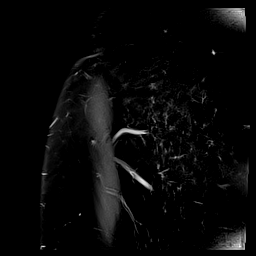

[Series 20: PD fat-sat · coronal · left · 4.0mm · 0.70mm/px · 7 of 29 slices shown (2 of 2)]
[im 1/29]
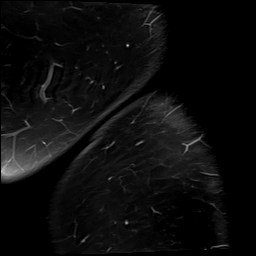
[im 5/29]
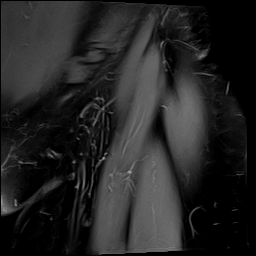
[im 10/29]
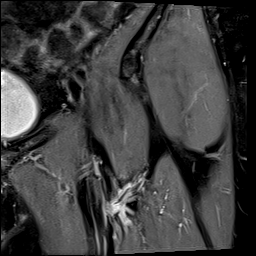
[im 15/29]
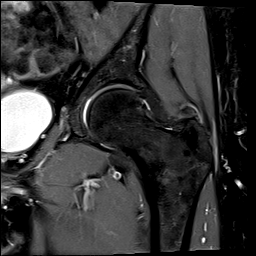
[im 19/29]
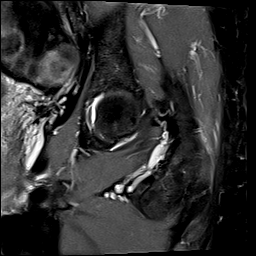
[im 24/29]
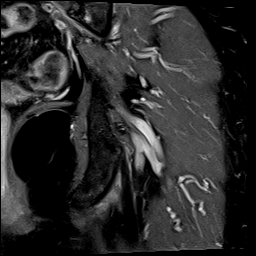
[im 29/29]
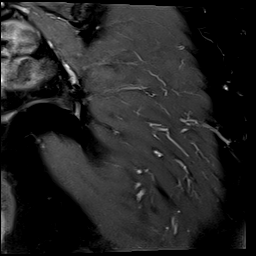

[40 of 40 positions shown; findings below may reference images not displayed]

FINDINGS: Bones: Marrow signal is normal without fracture, stress change or
worrisome lesion. No avascular necrosis of the femoral heads. No
subchondral cyst formation or edema about the hips.

Articular cartilage and labrum

Articular cartilage:  Preserved.

Labrum:  Appears intact.

Joint or bursal effusion

Joint effusion:  None.

Bursae: Negative.

Muscles and tendons

Muscles and tendons:  Intact and normal in appearance.

Other findings

Miscellaneous:   Imaged intrapelvic contents are negative.
IMPRESSION: Negative examination.  No finding to explain the patient's symptoms.

## 2020-09-18 IMAGING — MR MR SHOULDER*L* W/O CM
4 of 5 series · 33 of 40 positions shown · non-contrast
Comparison: None.

CLINICAL DATA: Chronic left shoulder pain.  No recent injury.

EXAM:
MRI OF THE LEFT SHOULDER WITHOUT CONTRAST
TECHNIQUE: Multiplanar, multisequence MR imaging of the shoulder was performed.
No intravenous contrast was administered.

[Series 5: T2 fat-sat · axial · left · 4.0mm · 0.55mm/px · z∈[-79,+41]mm · 8 of 26 slices shown (1 of 3)]
[im 1/26]
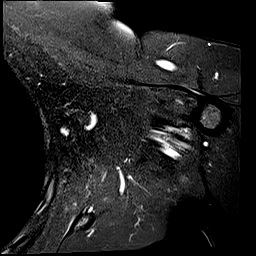
[im 4/26]
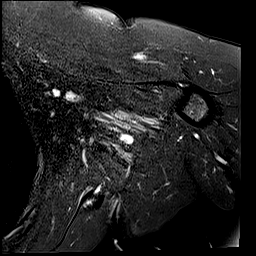
[im 8/26]
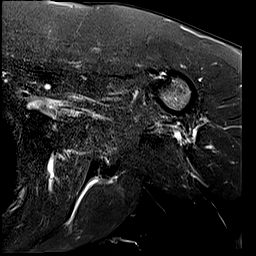
[im 11/26]
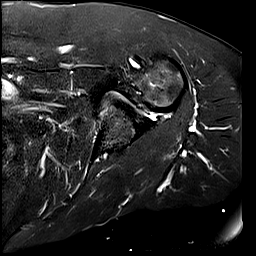
[im 15/26]
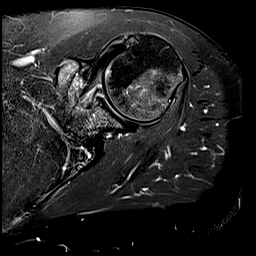
[im 18/26]
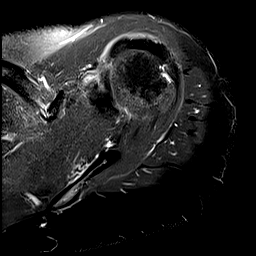
[im 22/26]
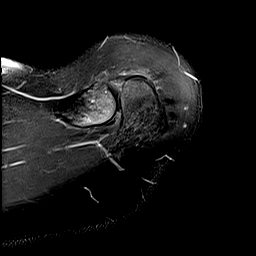
[im 26/26]
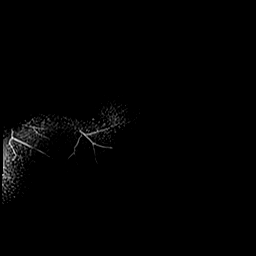

[Series 6: T2 fat-sat · oblique · left · 4.0mm · 0.44mm/px · 9 of 26 slices shown (2 of 3)]
[im 1/26]
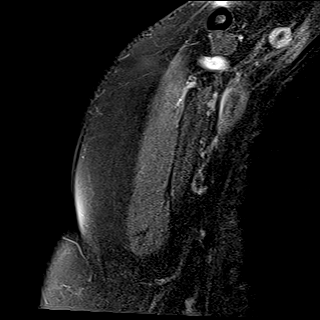
[im 4/26]
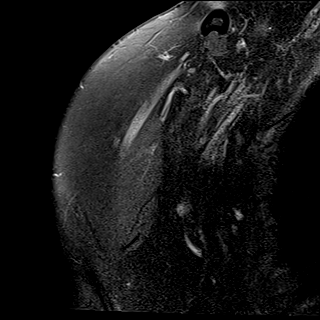
[im 7/26]
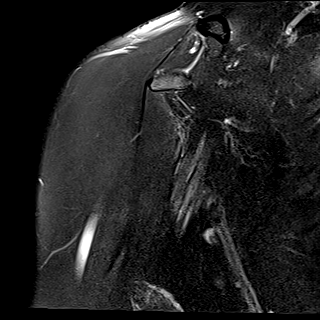
[im 10/26]
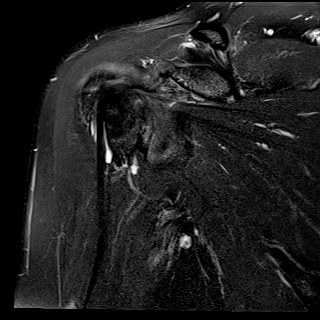
[im 13/26]
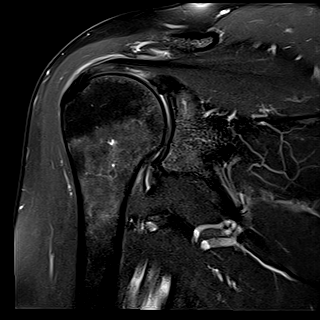
[im 16/26]
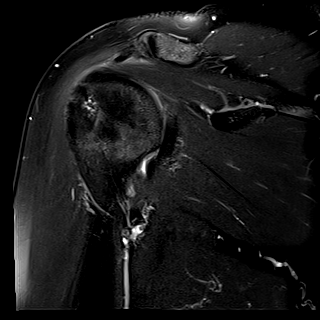
[im 19/26]
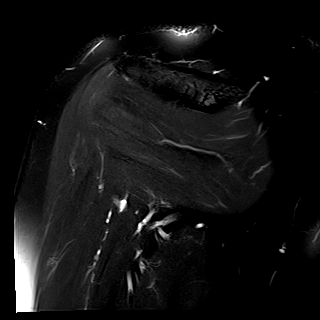
[im 22/26]
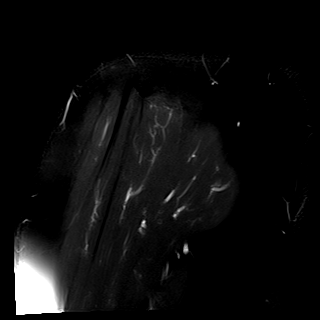
[im 26/26]
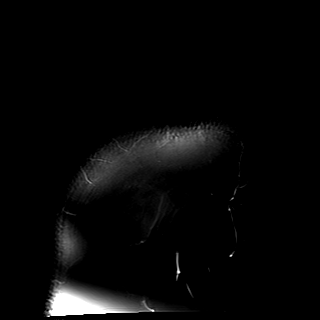

[Series 7: PD · oblique · left · 4.0mm · 0.44mm/px · 9 of 26 slices shown]
[im 1/26]
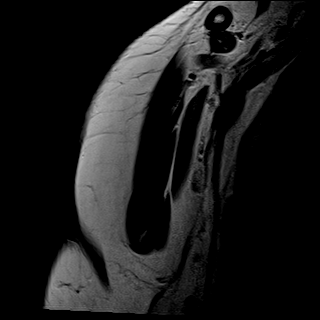
[im 4/26]
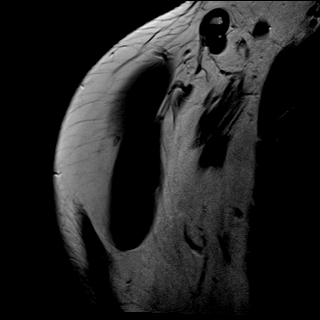
[im 7/26]
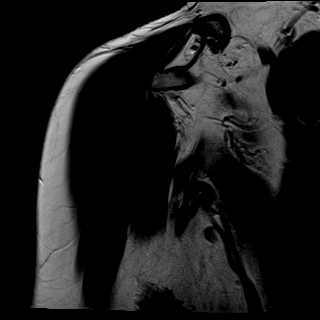
[im 10/26]
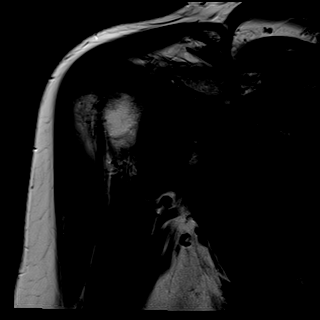
[im 13/26]
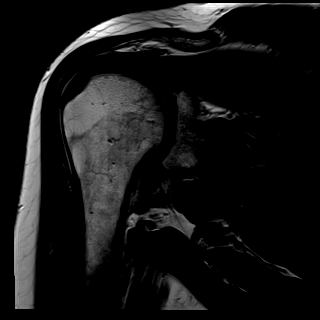
[im 16/26]
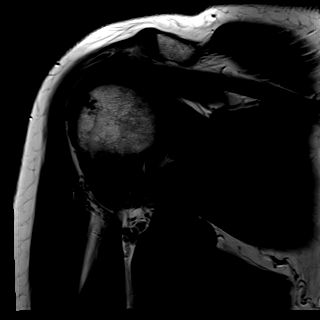
[im 19/26]
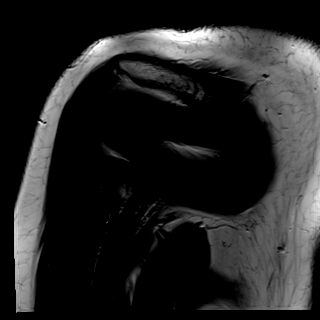
[im 22/26]
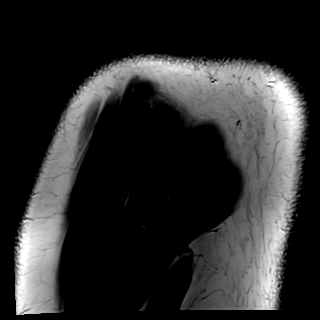
[im 26/26]
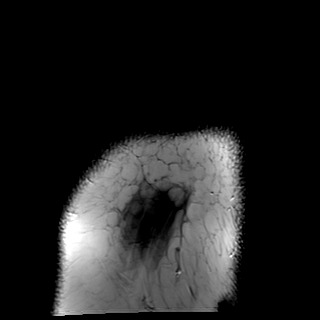

[Series 8: T2 fat-sat · oblique · left · 4.0mm · 0.23mm/px · 7 of 22 slices shown (3 of 3)]
[im 1/22]
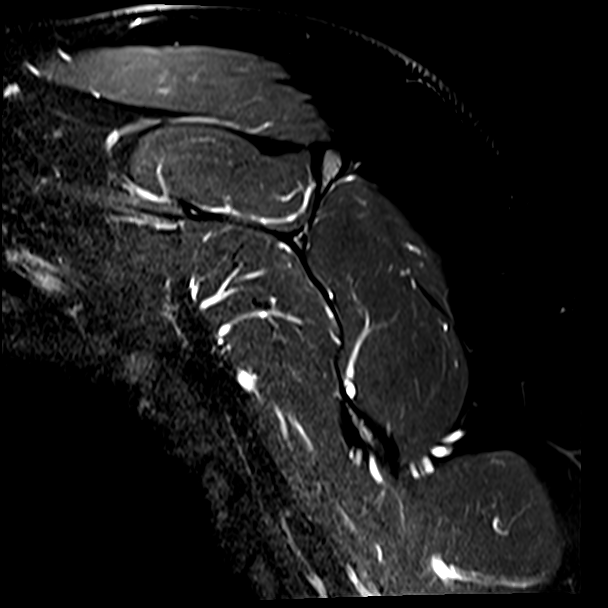
[im 4/22]
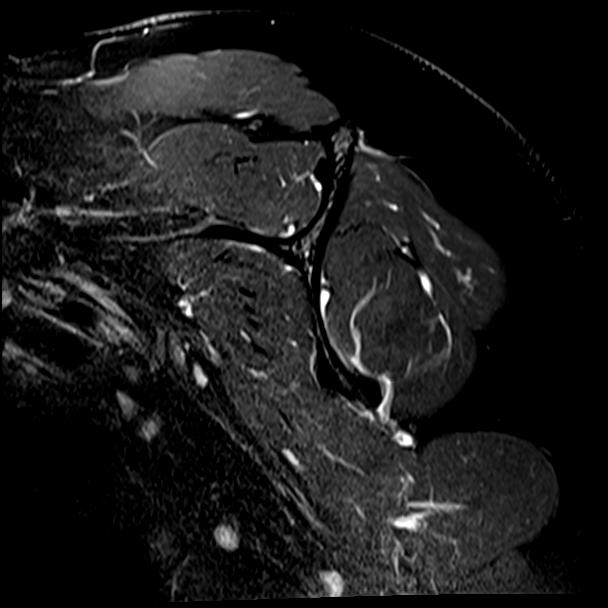
[im 8/22]
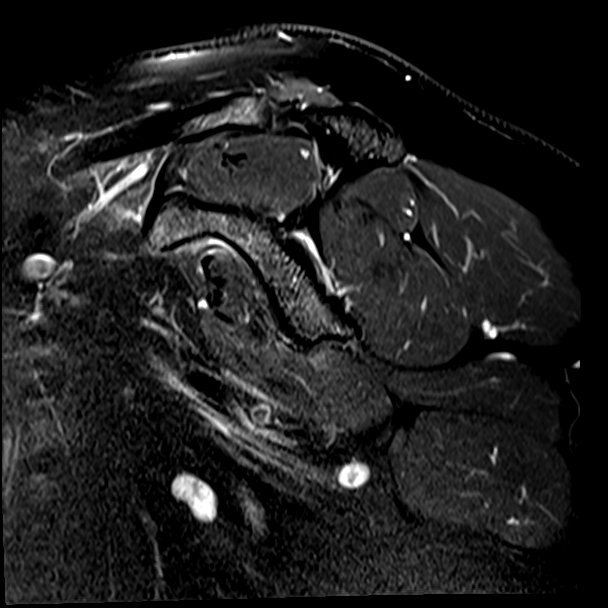
[im 11/22]
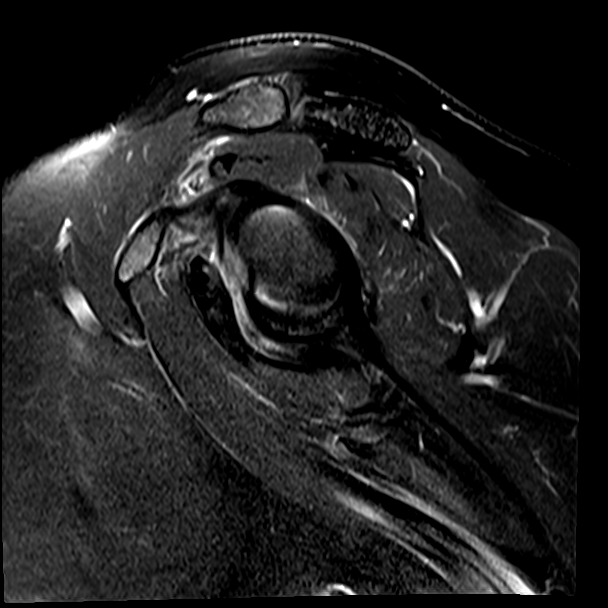
[im 15/22]
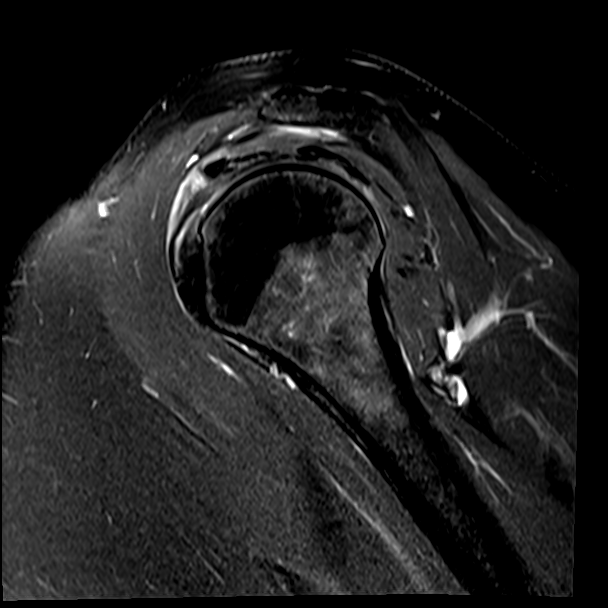
[im 18/22]
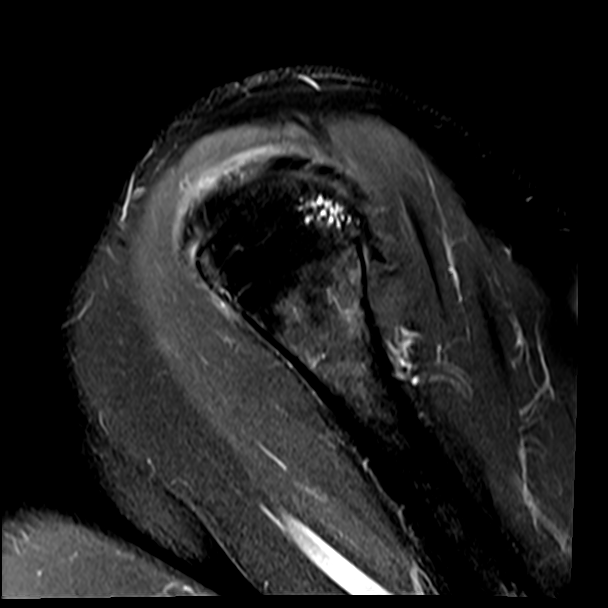
[im 22/22]
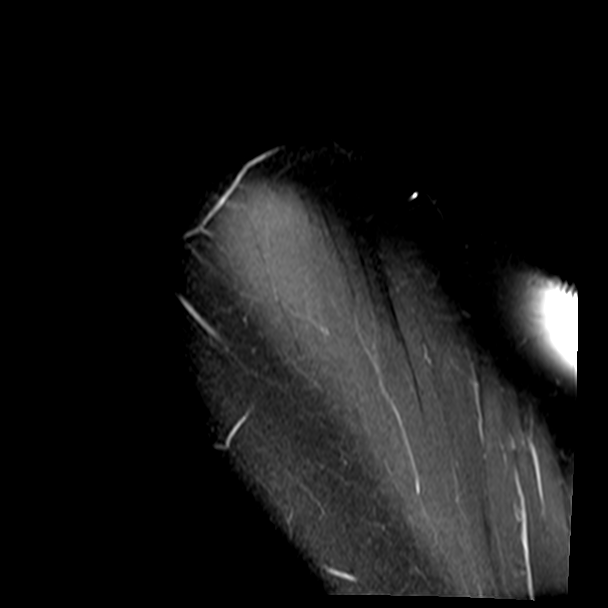

[33 of 40 positions shown; findings below may reference images not displayed]

FINDINGS: Rotator cuff: Intact. Thickening and heterogeneously increased T2
signal in the cuff tendons consistent with tendinopathy noted.

Muscles:  Normal without atrophy or focal lesion.

Biceps long head: Intact. Intrasubstance increased T2 signal in the
intra-articular segment consistent with tendinosis noted.

Acromioclavicular Joint: Appears normal. Type 1 acromion. A small
volume of fluid is seen in the subacromial/subdeltoid bursa.

Glenohumeral Joint: Mild intrasubstance increased T2 signal in the
inferior glenohumeral ligament is suggestive of adhesive capsulitis.

Labrum:  Intact.

Bones:  No fracture or worrisome lesion.

Other: None
IMPRESSION: Rotator cuff and intra-articular long head of biceps tendinosis
without tear.

Findings suggestive of adhesive capsulitis.

Small volume of subacromial/subdeltoid fluid compatible with
bursitis.

## 2020-09-18 MED ORDER — AMOXICILLIN-POT CLAVULANATE 875-125 MG PO TABS: 1.0000 | ORAL_TABLET | Freq: Two times a day (BID) | ORAL | 1 refills | Status: DC

## 2020-09-18 NOTE — Telephone Encounter (Signed)
Patient has been advised that she will need to contact her cardiologist to receive cardiac clearance for her colonoscopy.  She said she was thinking that our office was going to do so.  I explained to her that we do not arrange the appt for cardiac clearance, and apologized for the miscommunication.  She has requested to reschedule her colonoscopy one week out, which has been done to allow her time to get her appt in with her cardiologist.  Patients appt has been scheduled with Dr. Vicente Males to consult with patient prior to colonoscopy procedure as well.  This is due to her cardiac history.  She had a heart attack under anesthesia.  Thanks,  [place], [place]

## 2020-09-18 NOTE — Progress Notes (Signed)
Virtual telephone visit    Virtual Visit via Telephone Note   This visit type was conducted due to national recommendations for restrictions regarding the COVID-19 Pandemic (e.g. social distancing) in an effort to limit this patient's exposure and mitigate transmission in our community. Due to her co-morbid illnesses, this patient is at least at moderate risk for complications without adequate follow up. This format is felt to be most appropriate for this patient at this time. The patient did not have access to video technology or had technical difficulties with video requiring transitioning to audio format only (telephone). Physical exam was limited to content and character of the telephone converstion.    Patient location: home Provider location: office  I discussed the limitations of evaluation and management by telemedicine and the availability of in person appointments. The patient expressed understanding and agreed to proceed.   Visit Date: 09/18/2020  Today's healthcare provider: Wilhemena Durie, MD   Chief Complaint  Patient presents with  . Sinus Problem   Subjective    Sinus Problem This is a new problem. The current episode started 1 to 4 weeks ago. The problem has been waxing and waning since onset. Associated symptoms include congestion (stuffy  nose), coughing, diaphoresis, headaches, sneezing and a sore throat. Pertinent negatives include no chills or shortness of breath.    Patient was treated for Sinus infection on 08/27/2020. She was prescribed a z pack. Patient states that her symptoms improved while taking the antibiotic, but then started to worsen again. She has now had symptoms more than 5 weeks although they did improve some of the Z-Pak.  Complains of nasal congestion and thick postnasal drainage.  Congestion now has some color to it.  Low-grade fevers.  No myalgias.  She states she has had 3 - Covid test.  She states in the past when she has had the  symptoms antibiotics to help her     Medications: Outpatient Medications Prior to Visit  Medication Sig  . aspirin EC 81 MG tablet Take 81 mg by mouth daily.  Marland Kitchen atorvastatin (LIPITOR) 40 MG tablet Take 1 tablet (40 mg total) by mouth daily.  . benazepril (LOTENSIN) 40 MG tablet Take 1 tablet (40 mg total) by mouth daily.  Marland Kitchen CALCIUM-VITAMIN D PO Take 2 tablets by mouth daily.   Marland Kitchen doxycycline (PERIOSTAT) 20 MG tablet Take 1 tablet (20 mg total) by mouth 2 (two) times daily.  . Dulaglutide (TRULICITY) 8.18 EX/9.3ZJ SOPN Inject 0.75 mg into the skin once a week.  . fexofenadine (ALLEGRA) 180 MG tablet Take 180 mg by mouth in the morning.  Marland Kitchen glipiZIDE (GLUCOTROL) 10 MG tablet Take 1 tablet (10 mg total) by mouth daily before breakfast.  . levonorgestrel (LILETTA) 19.5 MCG/DAY IUD IUD by Intrauterine route.  . meclizine (ANTIVERT) 25 MG tablet Take 1 tablet (25 mg total) by mouth 3 (three) times daily as needed for dizziness.  . metFORMIN (GLUCOPHAGE) 1000 MG tablet TAKE 1 TABLET BY MOUTH 2 TIMES DAILY WITH A MEAL.  Marland Kitchen metoCLOPramide (REGLAN) 5 MG/ML injection Inject 10 mg into the vein daily as needed (migraine).   . Multiple Vitamin (MULTIVITAMIN) capsule Take 1 capsule by mouth daily.  . naproxen (NAPROSYN) 500 MG tablet Take 1 tablet (500 mg total) by mouth 2 (two) times daily as needed.  . nitroGLYCERIN (NITROSTAT) 0.4 MG SL tablet Place 1 tablet (0.4 mg total) under the tongue every 5 (five) minutes as needed for chest pain.  Marland Kitchen omeprazole (PRILOSEC)  20 MG capsule TAKE 1 CAPSULE BY MOUTH 2 TIMES DAILY BEFORE A MEAL.  Marland Kitchen ondansetron (ZOFRAN ODT) 4 MG disintegrating tablet Take 1 tablet (4 mg total) by mouth every 8 (eight) hours as needed for nausea or vomiting.  Marland Kitchen oxyCODONE (OXY IR/ROXICODONE) 5 MG immediate release tablet Take 1 tablet (5 mg total) by mouth every 4 (four) hours as needed for severe pain.  Marland Kitchen prochlorperazine (COMPAZINE) 10 MG tablet Take 1 tablet (10 mg total) by mouth every 6  (six) hours as needed for nausea or vomiting (migraine).  . promethazine (PHENERGAN) 25 MG tablet Take 1 tablet (25 mg total) by mouth every 6 (six) hours as needed for nausea or vomiting.  . RESTASIS 0.05 % ophthalmic emulsion Place 1 drop into both eyes 2 (two) times daily.   . fluconazole (DIFLUCAN) 150 MG tablet Take 150 mg by mouth once a week. And prn (Patient not taking: Reported on 09/18/2020)  . traMADol (ULTRAM) 50 MG tablet Take 50 mg by mouth every 6 (six) hours as needed. (Patient not taking: Reported on 09/18/2020)  . triamcinolone cream (KENALOG) 0.1 % Apply topically. (Patient not taking: Reported on 09/18/2020)  . [DISCONTINUED] azithromycin (ZITHROMAX) 250 MG tablet UAD (Patient not taking: Reported on 09/18/2020)  . [DISCONTINUED] doxycycline (VIBRA-TABS) 100 MG tablet Take 1 tablet (100 mg total) by mouth 2 (two) times daily. (Patient not taking: Reported on 09/18/2020)  . [DISCONTINUED] ketorolac (TORADOL) 10 MG tablet Take 1 tablet (10 mg total) by mouth every 8 (eight) hours as needed for moderate pain (with food). (Patient not taking: Reported on 09/18/2020)   No facility-administered medications prior to visit.    Review of Systems  Constitutional: Positive for diaphoresis, fatigue and fever (low grade). Negative for appetite change and chills.  HENT: Positive for congestion (stuffy  nose), rhinorrhea, sneezing and sore throat.        Ear fullness  Eyes: Positive for discharge (watery eyes) and itching.  Respiratory: Positive for cough. Negative for chest tightness and shortness of breath.   Cardiovascular: Negative for chest pain and palpitations.  Gastrointestinal: Negative for abdominal pain, nausea and vomiting.  Neurological: Positive for headaches. Negative for dizziness and weakness.       Objective    There were no vitals taken for this visit. BP Readings from Last 3 Encounters:  08/12/20 122/82  04/17/20 123/88  01/24/20 (!) 144/91   Wt Readings from Last  3 Encounters:  08/12/20 188 lb (85.3 kg)  04/17/20 188 lb (85.3 kg)  01/22/20 180 lb (81.6 kg)     Patient is alert and oriented.  She sounds like she has head congestion but is breathing fine.  She is cooperative.   Assessment & Plan     1. Sinusitis, unspecified chronicity, unspecified location Augmentin for 10 days.  Stop her doxycycline that she is on for rosacea. Use Nettie pot or saline nasal rinses.  I do not think this is Covid after 3 - test.   No follow-ups on file.    I discussed the assessment and treatment plan with the patient. The patient was provided an opportunity to ask questions and all were answered. The patient agreed with the plan and demonstrated an understanding of the instructions.   The patient was advised to call back or seek an in-person evaluation if the symptoms worsen or if the condition fails to improve as anticipated.  I provided 11 minutes of non-face-to-face time during this encounter.  I, Wilhemena Durie, MD, have  reviewed all documentation for this visit. The documentation on 09/18/20 for the exam, diagnosis, procedures, and orders are all accurate and complete.   Allison Deshotels [place] Mon, MD [facility] [phone] (phone) [phone] (fax)  [place]

## 2020-09-23 ENCOUNTER — Telehealth: Payer: Self-pay

## 2020-09-23 NOTE — Telephone Encounter (Signed)
Cardiology have cleared her then should be fine to go ahead.  Anesthesia will also evaluate her on the day of the procedure.  No issues from my end to do the procedure

## 2020-09-23 NOTE — Telephone Encounter (Signed)
Completed cardiac clearance for surgery was received on 09/11/20. Dr. Ubaldo Glassing has granted cardiac clearance for surgery as mentioned on cardiac clearance request.  Earlier I advised patient that she will need a cardiac evaluation and advised that she  schedule an office appt with Dr. Ubaldo Glassing, and yourself due to complications she experienced during anesthesia.  Since she was granted cardiac clearance per Dr. Ubaldo Glassing, would you like for [place] to move forward and  schedule her the office visit with you?  Please advise,  Thanks,  Sharyn [place], [place]

## 2020-09-23 NOTE — Telephone Encounter (Signed)
Dr. Vicente Males advised that we may cancel patients scheduled office visit with him to discuss colonoscopy.  Cardiac clearance has been received from Dr. Leamon Arnt office (received on 09/11/20).   Per Dr. Vicente Males Cardiology has cleared her then should be fine to go ahead. Pt has been informed of this and also that Anesthesia will also evaluate her on the day of the procedure. Dr. Vicente Males said he has no issues from his end to do the procedure.  Pt verbalized understanding and is glad she did not have to have an office visit.  Thanks,  [place], [place]

## 2020-10-09 ENCOUNTER — Ambulatory Visit (INDEPENDENT_AMBULATORY_CARE_PROVIDER_SITE_OTHER): Payer: 59 | Admitting: Family Medicine

## 2020-10-09 DIAGNOSIS — R059 Cough, unspecified: Secondary | ICD-10-CM

## 2020-10-09 DIAGNOSIS — U071 COVID-19: Secondary | ICD-10-CM | POA: Diagnosis not present

## 2020-10-09 DIAGNOSIS — E119 Type 2 diabetes mellitus without complications: Secondary | ICD-10-CM

## 2020-10-09 MED ORDER — ALBUTEROL SULFATE HFA 108 (90 BASE) MCG/ACT IN AERS: 2.0000 | INHALATION_SPRAY | Freq: Four times a day (QID) | RESPIRATORY_TRACT | 2 refills | Status: AC | PRN

## 2020-10-09 NOTE — Progress Notes (Signed)
Virtual telephone visit    Virtual Visit via Telephone Note   This visit type was conducted due to national recommendations for restrictions regarding the COVID-19 Pandemic (e.g. social distancing) in an effort to limit this patient's exposure and mitigate transmission in our community. Due to her co-morbid illnesses, this patient is at least at moderate risk for complications without adequate follow up. This format is felt to be most appropriate for this patient at this time. The patient did not have access to video technology or had technical difficulties with video requiring transitioning to audio format only (telephone). Physical exam was limited to content and character of the telephone converstion.    Patient location: Home Provider location: Office  I discussed the limitations of evaluation and management by telemedicine and the availability of in person appointments. The patient expressed understanding and agreed to proceed.   Visit Date: 10/09/2020  Today's healthcare provider: Wilhemena Durie, MD   Chief Complaint  Patient presents with  . URI   Subjective    URI  This is a new problem. The problem has been unchanged. Associated symptoms include coughing, headaches and sinus pain. Pertinent negatives include no ear pain. She has tried decongestant for the symptoms. The treatment provided mild relief.    Patient reports that she tested positive for Covid. She would like to discuss treatment going forward.  Patient has had 2 vaccines and a booster for COVID.  Days ago she developed sore throat postnasal drainage cough and low-grade fever.  She initially tested negative but yesterday tested positive by an antigen and a PCR test.  She has no shortness of breath no fever no headache.     Medications: Outpatient Medications Prior to Visit  Medication Sig  . amoxicillin-clavulanate (AUGMENTIN) 875-125 MG tablet Take 1 tablet by mouth 2 (two) times daily.  Marland Kitchen aspirin EC  81 MG tablet Take 81 mg by mouth daily.  Marland Kitchen atorvastatin (LIPITOR) 40 MG tablet Take 1 tablet (40 mg total) by mouth daily.  . benazepril (LOTENSIN) 40 MG tablet Take 1 tablet (40 mg total) by mouth daily.  Marland Kitchen CALCIUM-VITAMIN D PO Take 2 tablets by mouth daily.   Marland Kitchen doxycycline (PERIOSTAT) 20 MG tablet Take 1 tablet (20 mg total) by mouth 2 (two) times daily.  . Dulaglutide (TRULICITY) 8.34 HD/6.2IW SOPN Inject 0.75 mg into the skin once a week.  . fexofenadine (ALLEGRA) 180 MG tablet Take 180 mg by mouth in the morning.  . fluconazole (DIFLUCAN) 150 MG tablet Take 150 mg by mouth once a week. And prn (Patient not taking: Reported on 09/18/2020)  . glipiZIDE (GLUCOTROL) 10 MG tablet Take 1 tablet (10 mg total) by mouth daily before breakfast.  . levonorgestrel (LILETTA) 19.5 MCG/DAY IUD IUD by Intrauterine route.  . meclizine (ANTIVERT) 25 MG tablet Take 1 tablet (25 mg total) by mouth 3 (three) times daily as needed for dizziness.  . metFORMIN (GLUCOPHAGE) 1000 MG tablet TAKE 1 TABLET BY MOUTH 2 TIMES DAILY WITH A MEAL.  Marland Kitchen metoCLOPramide (REGLAN) 5 MG/ML injection Inject 10 mg into the vein daily as needed (migraine).   . Multiple Vitamin (MULTIVITAMIN) capsule Take 1 capsule by mouth daily.  . naproxen (NAPROSYN) 500 MG tablet Take 1 tablet (500 mg total) by mouth 2 (two) times daily as needed.  . nitroGLYCERIN (NITROSTAT) 0.4 MG SL tablet Place 1 tablet (0.4 mg total) under the tongue every 5 (five) minutes as needed for chest pain.  Marland Kitchen omeprazole (PRILOSEC) 20 MG  capsule TAKE 1 CAPSULE BY MOUTH 2 TIMES DAILY BEFORE A MEAL.  Marland Kitchen ondansetron (ZOFRAN ODT) 4 MG disintegrating tablet Take 1 tablet (4 mg total) by mouth every 8 (eight) hours as needed for nausea or vomiting.  Marland Kitchen oxyCODONE (OXY IR/ROXICODONE) 5 MG immediate release tablet Take 1 tablet (5 mg total) by mouth every 4 (four) hours as needed for severe pain.  Marland Kitchen prochlorperazine (COMPAZINE) 10 MG tablet Take 1 tablet (10 mg total) by mouth every  6 (six) hours as needed for nausea or vomiting (migraine).  . promethazine (PHENERGAN) 25 MG tablet Take 1 tablet (25 mg total) by mouth every 6 (six) hours as needed for nausea or vomiting.  . RESTASIS 0.05 % ophthalmic emulsion Place 1 drop into both eyes 2 (two) times daily.   . traMADol (ULTRAM) 50 MG tablet Take 50 mg by mouth every 6 (six) hours as needed. (Patient not taking: Reported on 09/18/2020)  . triamcinolone cream (KENALOG) 0.1 % Apply topically. (Patient not taking: Reported on 09/18/2020)   No facility-administered medications prior to visit.    Review of Systems  HENT: Positive for sinus pain. Negative for ear pain.   Respiratory: Positive for cough.   Neurological: Positive for headaches.       Objective    There were no vitals taken for this visit. She is cooperative and alert and oriented during the exam on the phone.  She is breathing easily.  The cough is a croupy type cough.  No audible wheezing    Assessment & Plan     1. COVID Patient has had booster vaccine with father has diabetes and history of heart disease. - Ambulatory referral for Covid Treatment  2. Cough With albuterol MDI. - Ambulatory referral for Covid Treatment  3. Type 2 diabetes mellitus without complication, without long-term current use of insulin (HCC) Last A1c 7.9.   No follow-ups on file.    I discussed the assessment and treatment plan with the patient. The patient was provided an opportunity to ask questions and all were answered. The patient agreed with the plan and demonstrated an understanding of the instructions.   The patient was advised to call back or seek an in-person evaluation if the symptoms worsen or if the condition fails to improve as anticipated.  I provided 12 minutes of non-face-to-face time during this encounter.    Rashidi Loh Cranford Mon, MD [facility] [phone] (phone) [phone] (fax)  [place]

## 2020-10-10 ENCOUNTER — Telehealth: Payer: Self-pay

## 2020-10-10 ENCOUNTER — Ambulatory Visit (HOSPITAL_COMMUNITY)
Admission: RE | Admit: 2020-10-10 | Discharge: 2020-10-10 | Disposition: A | Discharge status: Home / Self Care | Payer: 59 | Source: Ambulatory Visit | Attending: Pulmonary Disease | Admitting: Pulmonary Disease

## 2020-10-10 ENCOUNTER — Other Ambulatory Visit: Payer: Self-pay | Admitting: Nurse Practitioner

## 2020-10-10 DIAGNOSIS — Z6825 Body mass index (BMI) 25.0-25.9, adult: Secondary | ICD-10-CM | POA: Insufficient documentation

## 2020-10-10 DIAGNOSIS — E119 Type 2 diabetes mellitus without complications: Secondary | ICD-10-CM

## 2020-10-10 DIAGNOSIS — U071 COVID-19: Secondary | ICD-10-CM | POA: Diagnosis present

## 2020-10-10 DIAGNOSIS — I10 Essential (primary) hypertension: Secondary | ICD-10-CM

## 2020-10-10 MED ORDER — METHYLPREDNISOLONE SODIUM SUCC 125 MG IJ SOLR: 125.0000 mg | Freq: Once | INTRAMUSCULAR | Status: DC | PRN

## 2020-10-10 MED ORDER — SOTROVIMAB 500 MG/8ML IV SOLN
500.0000 mg | Freq: Once | INTRAVENOUS | Status: AC
  Administered 2020-10-10: 500 mg via INTRAVENOUS

## 2020-10-10 MED ORDER — ALBUTEROL SULFATE HFA 108 (90 BASE) MCG/ACT IN AERS: 2.0000 | INHALATION_SPRAY | Freq: Once | RESPIRATORY_TRACT | Status: DC | PRN

## 2020-10-10 MED ORDER — SODIUM CHLORIDE 0.9 % IV SOLN: INTRAVENOUS | Status: DC | PRN

## 2020-10-10 MED ORDER — DIPHENHYDRAMINE HCL 50 MG/ML IJ SOLN: 50.0000 mg | Freq: Once | INTRAMUSCULAR | Status: DC | PRN

## 2020-10-10 MED ORDER — FAMOTIDINE IN NACL 20-0.9 MG/50ML-% IV SOLN: 20.0000 mg | Freq: Once | INTRAVENOUS | Status: DC | PRN

## 2020-10-10 MED ORDER — EPINEPHRINE 0.3 MG/0.3ML IJ SOAJ: 0.3000 mg | Freq: Once | INTRAMUSCULAR | Status: DC | PRN

## 2020-10-10 MED ORDER — ONDANSETRON HCL 4 MG/2ML IJ SOLN
4.0000 mg | Freq: Once | INTRAMUSCULAR | Status: AC
  Administered 2020-10-10: 4 mg via INTRAVENOUS

## 2020-10-10 NOTE — Telephone Encounter (Signed)
Called to discuss with patient about COVID-19 symptoms and the use of one of the available treatments for those with mild to moderate Covid symptoms and at a high risk of hospitalization.  Pt appears to qualify for outpatient treatment due to co-morbid conditions and/or a member of an at-risk group in accordance with the FDA Emergency Use Authorization.    Symptom onset: 10/04/20 Cough, fever,sore throat, congestion, headache,fatigue Vaccinated: Yes Booster? Yes Immunocompromised? Yes Qualifiers: DM,HTN,Heart disease  Pt. Would like to speak with APP.  Judith Fox

## 2020-10-10 NOTE — Progress Notes (Signed)
\  I connected by phone with Judith Fox on 10/10/2020 at 11:08 AM to discuss the potential use of a new treatment for mild to moderate COVID-19 viral infection in non-hospitalized patients.  This patient is a 55 y.o. female that meets the FDA criteria for Emergency Use Authorization of COVID monoclonal antibody sotrovimab.  Has a (+) direct SARS-CoV-2 viral test result  Has mild or moderate COVID-19   Is NOT hospitalized due to COVID-19  Is within 10 days of symptom onset  Has at least one of the high risk factor(s) for progression to severe COVID-19 and/or hospitalization as defined in EUA.  Specific high risk criteria : BMI > 25 and Cardiovascular disease or hypertension   I have spoken and communicated the following to the patient or parent/caregiver regarding COVID monoclonal antibody treatment:  1. FDA has authorized the emergency use for the treatment of mild to moderate COVID-19 in adults and pediatric patients with positive results of direct SARS-CoV-2 viral testing who are 34 years of age and older weighing at least 40 kg, and who are at high risk for progressing to severe COVID-19 and/or hospitalization.  2. The significant known and potential risks and benefits of COVID monoclonal antibody, and the extent to which such potential risks and benefits are unknown.  3. Information on available alternative treatments and the risks and benefits of those alternatives, including clinical trials.  4. Patients treated with COVID monoclonal antibody should continue to self-isolate and use infection control measures (e.g., wear mask, isolate, social distance, avoid sharing personal items, clean and disinfect "high touch" surfaces, and frequent handwashing) according to CDC guidelines.   5. The patient or parent/caregiver has the option to accept or refuse COVID monoclonal antibody treatment.  After reviewing this information with the patient, the patient has agreed to receive one  of the available covid 19 monoclonal antibodies and will be provided an appropriate fact sheet prior to infusion. Jobe Gibbon, NP 10/10/2020 11:08 AM

## 2020-10-10 NOTE — Progress Notes (Signed)
Diagnosis: COVID-19  Physician: Dr. Patrick Wright  Procedure: [facility] Infusion Clinic Med: Sotrovimab infusion - Provided patient with sotrovimab fact sheet for patients, parents, and caregivers prior to infusion.   Complications: No immediate complications noted  Discharge: Discharged home    

## 2020-10-10 NOTE — Discharge Instructions (Signed)

## 2020-10-10 NOTE — Progress Notes (Signed)
Patient reviewed Fact Sheet for Patients, Parents, and Caregivers for Emergency Use Authorization (EUA) of sotrovimab for the Treatment of Coronavirus. Patient also reviewed and is agreeable to the estimated cost of treatment. Patient is agreeable to proceed.   

## 2020-10-13 ENCOUNTER — Telehealth: Payer: Self-pay

## 2020-10-13 ENCOUNTER — Encounter: Payer: Self-pay | Admitting: Family Medicine

## 2020-10-13 DIAGNOSIS — R059 Cough, unspecified: Secondary | ICD-10-CM

## 2020-10-13 NOTE — Telephone Encounter (Signed)
Please advise 

## 2020-10-13 NOTE — Telephone Encounter (Signed)
Copied from [place] [id number]. Topic: General - Other >> Oct 13, 2020  8:26 AM Leward Quan A wrote: Reason for CRM: Patient called in to inquire of Dr Rosanna Randy if he can send an Rx to the Pharmacy for an antibiotic. Say that her wheezing is now constant and coughing is getting worst say that she is currently Covid positive and feel like she is possibly getting pneumonia and need it treated before it gets any worst. Please call  Ph# [phone]

## 2020-10-14 ENCOUNTER — Other Ambulatory Visit: Payer: Self-pay | Admitting: Family Medicine

## 2020-10-14 DIAGNOSIS — L719 Rosacea, unspecified: Secondary | ICD-10-CM

## 2020-10-14 MED ORDER — AZITHROMYCIN 250 MG PO TABS: ORAL_TABLET | ORAL | 0 refills | Status: DC

## 2020-10-14 MED ORDER — PREDNISONE 10 MG (21) PO TBPK: ORAL_TABLET | ORAL | 0 refills | Status: DC

## 2020-10-15 NOTE — Telephone Encounter (Signed)
Yes, this was done on 10/13/2020.

## 2020-10-15 NOTE — Telephone Encounter (Signed)
I am pretty sure put sent in prescriptions for her.  Please check behind me.

## 2020-10-16 ENCOUNTER — Ambulatory Visit: Payer: 59 | Admitting: Gastroenterology

## 2020-10-21 ENCOUNTER — Other Ambulatory Visit: Admission: RE | Admit: 2020-10-21 | Payer: 59 | Source: Ambulatory Visit

## 2020-10-23 ENCOUNTER — Ambulatory Visit: Admission: RE | Admit: 2020-10-23 | Payer: 59 | Source: Ambulatory Visit | Admitting: Gastroenterology

## 2020-10-23 ENCOUNTER — Encounter: Admission: RE | Payer: Self-pay | Source: Ambulatory Visit

## 2020-10-23 ENCOUNTER — Other Ambulatory Visit: Payer: Self-pay

## 2020-10-23 DIAGNOSIS — Z1211 Encounter for screening for malignant neoplasm of colon: Secondary | ICD-10-CM

## 2020-10-23 SURGERY — COLONOSCOPY WITH PROPOFOL
Anesthesia: General

## 2020-10-23 NOTE — Progress Notes (Signed)
Colonoscopy R/S to 11/13/20. New instructions sent via mychart and mailed.  Thanks,  [place], [place]

## 2020-11-03 ENCOUNTER — Other Ambulatory Visit: Payer: Self-pay

## 2020-11-03 MED ORDER — CELECOXIB 200 MG PO CAPS: 200.0000 mg | ORAL_CAPSULE | Freq: Every day | ORAL | 5 refills | Status: DC

## 2020-11-03 NOTE — Telephone Encounter (Signed)
Ok to send in per Dr. Rosanna Randy.

## 2020-11-10 NOTE — Telephone Encounter (Signed)
Please send letter through [facility] stating that she had COVID on 3/2 and was treated 3/4 and is now recovered. Thanks!

## 2020-11-11 ENCOUNTER — Other Ambulatory Visit: Admission: RE | Admit: 2020-11-11 | Payer: 59 | Source: Ambulatory Visit

## 2020-11-13 ENCOUNTER — Ambulatory Visit: Payer: 59 | Admitting: Anesthesiology

## 2020-11-13 ENCOUNTER — Encounter: Payer: Self-pay | Admitting: Gastroenterology

## 2020-11-13 ENCOUNTER — Ambulatory Visit
Admission: RE | Admit: 2020-11-13 | Discharge: 2020-11-13 | Disposition: A | Discharge status: Home / Self Care | Payer: 59 | Source: Ambulatory Visit | Attending: Gastroenterology | Admitting: Gastroenterology

## 2020-11-13 ENCOUNTER — Encounter
Admission: RE | Disposition: A | Discharge status: Home / Self Care | Payer: Self-pay | Source: Ambulatory Visit | Attending: Gastroenterology

## 2020-11-13 ENCOUNTER — Telehealth: Payer: Self-pay

## 2020-11-13 ENCOUNTER — Other Ambulatory Visit: Payer: Self-pay

## 2020-11-13 DIAGNOSIS — Z8249 Family history of ischemic heart disease and other diseases of the circulatory system: Secondary | ICD-10-CM | POA: Diagnosis not present

## 2020-11-13 DIAGNOSIS — Z1211 Encounter for screening for malignant neoplasm of colon: Secondary | ICD-10-CM | POA: Diagnosis present

## 2020-11-13 DIAGNOSIS — K5730 Diverticulosis of large intestine without perforation or abscess without bleeding: Secondary | ICD-10-CM | POA: Diagnosis not present

## 2020-11-13 DIAGNOSIS — Z91040 Latex allergy status: Secondary | ICD-10-CM | POA: Insufficient documentation

## 2020-11-13 DIAGNOSIS — Z803 Family history of malignant neoplasm of breast: Secondary | ICD-10-CM | POA: Diagnosis not present

## 2020-11-13 DIAGNOSIS — Z791 Long term (current) use of non-steroidal anti-inflammatories (NSAID): Secondary | ICD-10-CM | POA: Insufficient documentation

## 2020-11-13 DIAGNOSIS — I10 Essential (primary) hypertension: Secondary | ICD-10-CM | POA: Diagnosis not present

## 2020-11-13 DIAGNOSIS — Z801 Family history of malignant neoplasm of trachea, bronchus and lung: Secondary | ICD-10-CM | POA: Insufficient documentation

## 2020-11-13 DIAGNOSIS — Z833 Family history of diabetes mellitus: Secondary | ICD-10-CM | POA: Insufficient documentation

## 2020-11-13 DIAGNOSIS — E739 Lactose intolerance, unspecified: Secondary | ICD-10-CM | POA: Diagnosis not present

## 2020-11-13 DIAGNOSIS — Z820 Family history of epilepsy and other diseases of the nervous system: Secondary | ICD-10-CM | POA: Diagnosis not present

## 2020-11-13 DIAGNOSIS — Z79899 Other long term (current) drug therapy: Secondary | ICD-10-CM | POA: Diagnosis not present

## 2020-11-13 DIAGNOSIS — Z7984 Long term (current) use of oral hypoglycemic drugs: Secondary | ICD-10-CM | POA: Insufficient documentation

## 2020-11-13 DIAGNOSIS — E119 Type 2 diabetes mellitus without complications: Secondary | ICD-10-CM | POA: Insufficient documentation

## 2020-11-13 DIAGNOSIS — Z7982 Long term (current) use of aspirin: Secondary | ICD-10-CM | POA: Diagnosis not present

## 2020-11-13 DIAGNOSIS — Z888 Allergy status to other drugs, medicaments and biological substances status: Secondary | ICD-10-CM | POA: Diagnosis not present

## 2020-11-13 HISTORY — PX: COLONOSCOPY WITH PROPOFOL: SHX5780

## 2020-11-13 HISTORY — DX: Cardiac arrhythmia, unspecified: I49.9

## 2020-11-13 LAB — GLUCOSE, CAPILLARY: Glucose-Capillary: 182 mg/dL — ABNORMAL HIGH (ref 70–99)

## 2020-11-13 SURGERY — COLONOSCOPY WITH PROPOFOL
Anesthesia: General

## 2020-11-13 MED ORDER — PROPOFOL 500 MG/50ML IV EMUL
INTRAVENOUS | Status: DC | PRN
  Administered 2020-11-13: 140 ug/kg/min via INTRAVENOUS

## 2020-11-13 MED ORDER — PROPOFOL 10 MG/ML IV BOLUS
INTRAVENOUS | Status: DC | PRN
  Administered 2020-11-13: 70 mg via INTRAVENOUS

## 2020-11-13 MED ORDER — SODIUM CHLORIDE 0.9 % IV SOLN: INTRAVENOUS | Status: DC

## 2020-11-13 NOTE — Transfer of Care (Signed)
Immediate Anesthesia Transfer of Care Note  Patient: Judith Fox  Procedure(s) Performed: COLONOSCOPY WITH PROPOFOL (N/A )  Patient Location: PACU  Anesthesia Type:General  Level of Consciousness: sedated  Airway & Oxygen Therapy: Patient Spontanous Breathing  Post-op Assessment: Report given to RN and Post -op Vital signs reviewed and stable  Post vital signs: Reviewed and stable  Last Vitals:  Vitals Value Taken Time  BP 126/79 11/13/20 0937  Temp    Pulse 79 11/13/20 0937  Resp 16 11/13/20 0937  SpO2 98 % 11/13/20 0937  Vitals shown include unvalidated device data.  Last Pain:  Vitals:   11/13/20 0937  TempSrc: Temporal  PainSc: Asleep         Complications: No complications documented.

## 2020-11-13 NOTE — Op Note (Signed)
[facility] Patient Name: Judith Fox Procedure Date: 11/13/2020 9:03 AM MRN: [id number] Account #: [id number] Date of Birth: Jul 10, 1966 Admit Type: Outpatient Age: 55 Room: [facility] ENDO ROOM 4 Gender: Female Note Status: Finalized Procedure:             Colonoscopy Indications:           Screening for colorectal malignant neoplasm Providers:             Jonathon Bellows MD, MD Referring MD:          Janine Ores. Rosanna Randy, MD (Referring MD) Medicines:             Monitored Anesthesia Care Complications:         No immediate complications. Procedure:             Pre-Anesthesia Assessment:                        - Prior to the procedure, a History and Physical was                         performed, and patient medications, allergies and                         sensitivities were reviewed. The patient's tolerance                         of previous anesthesia was reviewed.                        - The risks and benefits of the procedure and the                         sedation options and risks were discussed with the                         patient. All questions were answered and informed                         consent was obtained.                        - ASA Grade Assessment: II - A patient with mild                         systemic disease.                        After obtaining informed consent, the colonoscope was                         passed under direct vision. Throughout the procedure,                         the patient's blood pressure, pulse, and oxygen                         saturations were monitored continuously. The                         Colonoscope was introduced through the anus  and                         advanced to the the cecum, identified by the                         appendiceal orifice. The colonoscopy was performed                         with ease. The patient tolerated the procedure well.                         The  quality of the bowel preparation was good. Findings:      The perianal and digital rectal examinations were normal.      A few small-mouthed diverticula were found in the entire colon.      The exam was otherwise without abnormality on direct and retroflexion       views. Impression:            - Diverticulosis in the entire examined colon.                        - The examination was otherwise normal on direct and                         retroflexion views.                        - No specimens collected. Recommendation:        - Discharge patient to home (with escort).                        - Resume previous diet.                        - Continue present medications.                        - Repeat colonoscopy in 10 years for screening                         purposes. Procedure Code(s):     --- Professional ---                        [phone], Colonoscopy, flexible; diagnostic, including                         collection of specimen(s) by brushing or washing, when                         performed (separate procedure) Diagnosis Code(s):     --- Professional ---                        Z12.11, Encounter for screening for malignant neoplasm                         of colon                        K57.30, Diverticulosis of large intestine without  perforation or abscess without bleeding CPT copyright 2019 American Medical Association. All rights reserved. The codes documented in this report are preliminary and upon coder review may  be revised to meet current compliance requirements. Jonathon Bellows, MD Jonathon Bellows MD, MD 11/13/2020 9:37:23 AM This report has been signed electronically. Number of Addenda: 0 Note Initiated On: 11/13/2020 9:03 AM Scope Withdrawal Time: 0 hours 16 minutes 10 seconds  Total Procedure Duration: 0 hours 19 minutes 34 seconds  Estimated Blood Loss:  Estimated blood loss: none.      [facility]

## 2020-11-13 NOTE — H&P (Signed)
Jonathon Bellows, MD [address], [place], [place], [place], [address] [address], [place], [place], [place], [address] Phone: [phone]  Fax: [phone]  Primary Care Physician:  Jerrol Banana., MD   Pre-Procedure History & Physical: HPI:  Judith Fox is a 55 y.o. female is here for an colonoscopy.   Past Medical History:  Diagnosis Date  . Allergy   . Arthritis    KNEE  . Asthma    "mild" per pt  . Complication of anesthesia    PT HAD A MI AFTER KNEE SURGERY IN 2014 FROM HIGH BLOOD PRESSURE-SURGERY WAS DONE AT AN OUTPATIENT SURGERY CENTER  . Diabetes mellitus without complication ([place])   . Dysrhythmia    sometimes, d/t stress, followed by cardiology  . History of hiatal hernia   . History of kidney stones   . Hypertension   . Migraine   . Myocardial infarction ([place]) 2014   no stents  . Pneumonia    h/o    Past Surgical History:  Procedure Laterality Date  . ANAL FISSURE REPAIR    . INTRAUTERINE DEVICE (IUD) INSERTION N/A 11/02/2019   Procedure: INTRAUTERINE DEVICE (IUD) INSERTION - LILETTA;  Surgeon: Benjaman Kindler, MD;  Location: [facility] ORS;  Service: Gynecology;  Laterality: N/A;  . IUD REMOVAL N/A 11/02/2019   Procedure: INTRAUTERINE DEVICE (IUD) REMOVAL- MIRENA;  Surgeon: Benjaman Kindler, MD;  Location: [facility] ORS;  Service: Gynecology;  Laterality: N/A;  . JOINT REPLACEMENT Right    knee replacement  . KNEE ARTHROSCOPY    . LAPAROSCOPIC OVARIAN CYSTECTOMY Right 11/02/2019   Procedure: LAPAROSCOPIC PERITUBALCYSTECTOMY;  Surgeon: Benjaman Kindler, MD;  Location: [facility] ORS;  Service: Gynecology;  Laterality: Right;  . LAPAROSCOPIC SALPINGO OOPHERECTOMY Left 11/02/2019   Procedure: LAPAROSCOPIC OOPHO;  Surgeon: Benjaman Kindler, MD;  Location: [facility] ORS;  Service: Gynecology;  Laterality: Left;  . LAPAROSCOPY N/A 11/02/2019   Procedure: LAPAROSCOPY DIAGNOSTIC WITH PELVIC WASHINGS;  Surgeon: Benjaman Kindler, MD;  Location: [facility] ORS;   Service: Gynecology;  Laterality: N/A;  . TONSILLECTOMY    . WISDOM TOOTH EXTRACTION      Prior to Admission medications   Medication Sig Start Date End Date Taking? Authorizing Provider  aspirin EC 81 MG tablet Take 81 mg by mouth daily.   Yes [provider]  atorvastatin (LIPITOR) 40 MG tablet Take 1 tablet (40 mg total) by mouth daily. 08/27/20  Yes Jerrol Banana., MD  benazepril (LOTENSIN) 40 MG tablet Take 1 tablet (40 mg total) by mouth daily. 08/27/20  Yes Jerrol Banana., MD  CALCIUM-VITAMIN D PO Take 2 tablets by mouth daily.    Yes [provider]  celecoxib (CELEBREX) 200 MG capsule Take 1 capsule (200 mg total) by mouth daily. 11/03/20  Yes Jerrol Banana., MD  doxycycline (PERIOSTAT) 20 MG tablet Take 1 tablet (20 mg total) by mouth 2 (two) times daily. 08/27/20  Yes Jerrol Banana., MD  Dulaglutide (TRULICITY) 3.14 HF/0.2OV SOPN Inject 0.75 mg into the skin once a week. 09/09/20  Yes Jerrol Banana., MD  fexofenadine (ALLEGRA) 180 MG tablet Take 180 mg by mouth in the morning.   Yes [provider]  glipiZIDE (GLUCOTROL) 10 MG tablet Take 1 tablet (10 mg total) by mouth daily before breakfast. 08/27/20  Yes Jerrol Banana., MD  meclizine (ANTIVERT) 25 MG tablet Take 1 tablet (25 mg total) by mouth 3 (three) times daily as needed for  dizziness. 08/12/20  Yes Jerrol Banana., MD  metFORMIN (GLUCOPHAGE) 1000 MG tablet TAKE 1 TABLET BY MOUTH 2 TIMES DAILY WITH A MEAL. 08/27/20  Yes Jerrol Banana., MD  Multiple Vitamin (MULTIVITAMIN) capsule Take 1 capsule by mouth daily.   Yes [provider]  omeprazole (PRILOSEC) 20 MG capsule TAKE 1 CAPSULE BY MOUTH 2 TIMES DAILY BEFORE A MEAL. 08/27/20  Yes Jerrol Banana., MD  albuterol (VENTOLIN HFA) 108 (90 Base) MCG/ACT inhaler Inhale 2 puffs into the lungs every 6 (six) hours as needed for wheezing or shortness of breath. 10/09/20   Jerrol Banana.,  MD  amoxicillin-clavulanate (AUGMENTIN) 875-125 MG tablet Take 1 tablet by mouth 2 (two) times daily. Patient not taking: Reported on 11/13/2020 09/18/20   Jerrol Banana., MD  azithromycin (ZITHROMAX) 250 MG tablet Take 2 tablets by mouth today, then 1 tablet by mouth daily. Patient not taking: Reported on 11/13/2020 10/14/20   Jerrol Banana., MD  fluconazole (DIFLUCAN) 150 MG tablet Take 150 mg by mouth once a week. And prn Patient not taking: Reported on 09/18/2020    [provider]  levonorgestrel (LILETTA) 19.5 MCG/DAY IUD IUD by Intrauterine route.    [provider]  metoCLOPramide (REGLAN) 5 MG/ML injection Inject 10 mg into the vein daily as needed (migraine).     [provider]  naproxen (NAPROSYN) 500 MG tablet Take 1 tablet (500 mg total) by mouth 2 (two) times daily as needed. Patient not taking: Reported on 11/13/2020 08/27/20   Jerrol Banana., MD  nitroGLYCERIN (NITROSTAT) 0.4 MG SL tablet Place 1 tablet (0.4 mg total) under the tongue every 5 (five) minutes as needed for chest pain. 08/27/20   Jerrol Banana., MD  ondansetron (ZOFRAN ODT) 4 MG disintegrating tablet Take 1 tablet (4 mg total) by mouth every 8 (eight) hours as needed for nausea or vomiting. 01/18/20   Carrie Mew, MD  oxyCODONE (OXY IR/ROXICODONE) 5 MG immediate release tablet Take 1 tablet (5 mg total) by mouth every 4 (four) hours as needed for severe pain. 11/02/19   Benjaman Kindler, MD  predniSONE (STERAPRED UNI-PAK 21 TAB) 10 MG (21) TBPK tablet Taper as directed. Patient not taking: Reported on 11/13/2020 10/14/20   Jerrol Banana., MD  prochlorperazine (COMPAZINE) 10 MG tablet Take 1 tablet (10 mg total) by mouth every 6 (six) hours as needed for nausea or vomiting (migraine). 10/04/17   Eula Listen, MD  promethazine (PHENERGAN) 25 MG tablet Take 1 tablet (25 mg total) by mouth every 6 (six) hours as needed for nausea or vomiting. 09/12/17   Jerrol Banana., MD  RESTASIS 0.05 % ophthalmic emulsion Place 1 drop into both eyes 2 (two) times daily.  07/12/19   [provider]  traMADol (ULTRAM) 50 MG tablet Take 50 mg by mouth every 6 (six) hours as needed. Patient not taking: Reported on 09/18/2020 01/03/20   [provider]  triamcinolone cream (KENALOG) 0.1 % Apply topically. Patient not taking: Reported on 09/18/2020 04/01/20 04/01/21  [provider]    Allergies as of 10/23/2020 - Review Complete 10/10/2020  Allergen Reaction Noted  . Latex Rash and Swelling 11/30/2011  . Butorphanol Other (See Comments) 11/30/2011  . Lactose intolerance (gi) Other (See Comments) 11/22/2017    Family History  Problem Relation Age of Onset  . Migraines Mother   . Cancer Maternal Aunt  lung  . Cancer Maternal Grandmother        breast  . Diabetes Maternal Grandmother   . Breast cancer Maternal Grandmother 2058-11-10  . Cancer Father        died age 17 with metastatic cancer, uncertain origin  . Hypertension Brother   . Breast cancer Cousin 40       maternal  . Breast cancer Cousin 80       maternal    Social History   Socioeconomic History  . Marital status: Married    Spouse name: Not on file  . Number of children: Not on file  . Years of education: Not on file  . Highest education level: Not on file  Occupational History  . Not on file  Tobacco Use  . Smoking status: Never Smoker  . Smokeless tobacco: Never Used  Vaping Use  . Vaping Use: Never used  Substance and Sexual Activity  . Alcohol use: Yes    Alcohol/week: 2.0 standard drinks    Types: 1 Glasses of wine, 1 Shots of liquor per week    Comment: average varies, possibly once monthly  . Drug use: No  . Sexual activity: Not on file  Other Topics Concern  . Not on file  Social History Narrative  . Not on file   Social Determinants of Health   Financial Resource Strain: Not on file  Food Insecurity: Not on file  Transportation Needs:  Not on file  Physical Activity: Not on file  Stress: Not on file  Social Connections: Not on file  Intimate Partner Violence: Not on file    Review of Systems: See HPI, otherwise negative ROS  Physical Exam: BP (!) 140/100   Pulse 92   Temp 97.6 F (36.4 C) (Temporal)   Resp 18   Ht 5\' 9"  (1.753 m)   Wt 83.9 kg   SpO2 100%   BMI 27.32 kg/m  General:   Alert,  pleasant and cooperative in NAD Head:  Normocephalic and atraumatic. Neck:  Supple; no masses or thyromegaly. Lungs:  Clear throughout to auscultation, normal respiratory effort.    Heart:  +S1, +S2, Regular rate and rhythm, No edema. Abdomen:  Soft, nontender and nondistended. Normal bowel sounds, without guarding, and without rebound.   Neurologic:  Alert and  oriented x4;  grossly normal neurologically.  Impression/Plan: Judith Fox is here for an colonoscopy to be performed for Screening colonoscopy average risk   Risks, benefits, limitations, and alternatives regarding  colonoscopy have been reviewed with the patient.  Questions have been answered.  All parties agreeable.   Jonathon Bellows, MD  11/13/2020, 9:03 AM

## 2020-11-13 NOTE — Telephone Encounter (Signed)
Communication done.

## 2020-11-13 NOTE — Anesthesia Preprocedure Evaluation (Addendum)
Anesthesia Evaluation  Patient identified by MRN, date of birth, ID band Patient awake    Reviewed: Allergy & Precautions, H&P , NPO status , Patient's Chart, lab work & pertinent test results  History of Anesthesia Complications (+) history of anesthetic complications (MI following knee replacement)  Airway Mallampati: II  TM Distance: >3 FB     Dental  (+) Teeth Intact   Pulmonary asthma , neg sleep apnea, neg COPD,    breath sounds clear to auscultation       Cardiovascular hypertension, (-) angina+ Past MI  (-) Cardiac Stents + dysrhythmias ("heart gets out of rhythm sometimes when I'm stressed." No mention of this in cardiology note)  Rhythm:regular Rate:Normal     Neuro/Psych  Headaches, Anxiety negative psych ROS   GI/Hepatic Neg liver ROS, hiatal hernia,   Endo/Other  diabetes  Renal/GU negative Renal ROS  negative genitourinary   Musculoskeletal   Abdominal   Peds  Hematology negative hematology ROS (+)   Anesthesia Other Findings Past Medical History: No date: Allergy No date: Arthritis     Comment:  KNEE No date: Asthma     Comment:  "mild" per pt No date: Complication of anesthesia     Comment:  PT HAD A MI AFTER KNEE SURGERY IN 2014 FROM HIGH BLOOD               PRESSURE-SURGERY WAS DONE AT AN [place] No date: Diabetes mellitus without complication ([place]) No date: Dysrhythmia     Comment:  sometimes, d/t stress, followed by cardiology No date: History of hiatal hernia No date: History of kidney stones No date: Hypertension No date: Migraine 2014: Myocardial infarction ([place])     Comment:  no stents No date: Pneumonia     Comment:  h/o  Past Surgical History: No date: ANAL FISSURE REPAIR 11/02/2019: INTRAUTERINE DEVICE (IUD) INSERTION; N/A     Comment:  Procedure: INTRAUTERINE DEVICE (IUD) INSERTION -               LILETTA;  Surgeon: Benjaman Kindler, MD;  Location: [facility]                ORS;  Service: Gynecology;  Laterality: N/A; 11/02/2019: IUD REMOVAL; N/A     Comment:  Procedure: INTRAUTERINE DEVICE (IUD) REMOVAL- MIRENA;                Surgeon: Benjaman Kindler, MD;  Location: [facility] ORS;                Service: Gynecology;  Laterality: N/A; No date: JOINT REPLACEMENT; Right     Comment:  knee replacement No date: KNEE ARTHROSCOPY 11/02/2019: LAPAROSCOPIC OVARIAN CYSTECTOMY; Right     Comment:  Procedure: LAPAROSCOPIC PERITUBALCYSTECTOMY;  Surgeon:               Benjaman Kindler, MD;  Location: [facility] ORS;  Service:               Gynecology;  Laterality: Right; 11/02/2019: LAPAROSCOPIC SALPINGO OOPHERECTOMY; Left     Comment:  Procedure: LAPAROSCOPIC OOPHO;  Surgeon: Benjaman Kindler, MD;  Location: [facility] ORS;  Service: Gynecology;                Laterality: Left; 11/02/2019: LAPAROSCOPY; N/A     Comment:  Procedure: LAPAROSCOPY DIAGNOSTIC WITH PELVIC WASHINGS;               Surgeon:  Benjaman Kindler, MD;  Location: [facility] ORS;                Service: Gynecology;  Laterality: N/A; No date: TONSILLECTOMY No date: WISDOM TOOTH EXTRACTION  BMI    Body Mass Index: 27.32 kg/m      Reproductive/Obstetrics negative OB ROS                            Anesthesia Physical Anesthesia Plan  ASA: III  Anesthesia Plan: General   Post-op Pain Management:    Induction:   PONV Risk Score and Plan: Propofol infusion and TIVA  Airway Management Planned: Nasal Cannula  Additional Equipment:   Intra-op Plan:   Post-operative Plan:   Informed Consent: I have reviewed the patients History and Physical, chart, labs and discussed the procedure including the risks, benefits and alternatives for the proposed anesthesia with the patient or authorized representative who has indicated his/her understanding and acceptance.     Dental Advisory Given  Plan Discussed with: Anesthesiologist, CRNA and Surgeon  Anesthesia Plan  Comments:         Anesthesia Quick Evaluation

## 2020-11-13 NOTE — Anesthesia Postprocedure Evaluation (Signed)
Anesthesia Post Note  Patient: Judith Fox  Procedure(s) Performed: COLONOSCOPY WITH PROPOFOL (N/A )  Patient location during evaluation: PACU Anesthesia Type: General Level of consciousness: awake and alert Pain management: pain level controlled Vital Signs Assessment: post-procedure vital signs reviewed and stable Respiratory status: spontaneous breathing, nonlabored ventilation and respiratory function stable Cardiovascular status: blood pressure returned to baseline and stable Postop Assessment: no apparent nausea or vomiting Anesthetic complications: no   No complications documented.   Last Vitals:  Vitals:   11/13/20 0947 11/13/20 0957  BP: (!) 134/91 (!) 159/93  Pulse: 73 70  Resp: 20 12  Temp:    SpO2: 100% 100%    Last Pain:  Vitals:   11/13/20 0957  TempSrc:   PainSc: 0-No pain                 Brett Canales Shanya Ferriss

## 2020-11-14 ENCOUNTER — Encounter: Payer: Self-pay | Admitting: Gastroenterology

## 2020-12-01 ENCOUNTER — Ambulatory Visit: Payer: Self-pay

## 2020-12-01 NOTE — Telephone Encounter (Signed)
BS 185-200 fast in am Pt stated that she doesn't check sugars unless she feels symptomatic. Fatigue, increased frequency of urination, occasional dizziness. No burning or urgency. Mouth dry. Needs a mew meter. Pt requesting a Hgb A1C and med adjustments. Unable to find an available appt until after 12/25/20. Pt given care advice and pt verbalized understanding. Advised pt to check BS at least twice daily.   Reason for Disposition . [1] Symptoms of high blood sugar (e.g., frequent urination, weak, weight loss) AND [2] not able to test blood glucose    Has a meter  Answer Assessment - Initial Assessment Questions 1. BLOOD GLUCOSE: "What is your blood glucose level?"      Did not check 2. ONSET: "When did you check the blood glucose?"     Checks prn 3. USUAL RANGE: "What is your glucose level usually?" (e.g., usual fasting morning value, usual evening value)     185-200 in am  4. KETONES: "Do you check for ketones (urine or blood test strips)?" If yes, ask: "What does the test show now?"      no 5. TYPE 1 or 2:  "Do you know what type of diabetes you have?"  (e.g., Type 1, Type 2, Gestational; doesn't know)      Type 2  6. INSULIN: "Do you take insulin?" "What type of insulin(s) do you use? What is the mode of delivery? (syringe, pen; injection or pump)?"      no 7. DIABETES PILLS: "Do you take any pills for your diabetes?" If yes, ask: "Have you missed taking any pills recently?"     Trulicity, 4847 mg Met bid and Glipizide 10 mg daily 8. OTHER SYMPTOMS: "Do you have any symptoms?" (e.g., fever, frequent urination, difficulty breathing, dizziness, weakness, vomiting)     Fatigue, increased frequency of urination, dry mouth and lightheadness (last episode last Friday) pt stated that she does not have episodes daily.  Protocols used: DIABETES - HIGH BLOOD SUGAR-A-AH

## 2020-12-02 ENCOUNTER — Other Ambulatory Visit: Payer: Self-pay | Admitting: Family Medicine

## 2020-12-02 DIAGNOSIS — E119 Type 2 diabetes mellitus without complications: Secondary | ICD-10-CM

## 2020-12-02 NOTE — Telephone Encounter (Signed)
Requested Prescriptions  Pending Prescriptions Disp Refills  . TRULICITY 1.76 HY/0.7PX SOPN [Pharmacy Med Name: TRULICITY 1.06 YI/9.4 ML PEN] 6 mL 1    Sig: INJECT 0.75 MG INTO THE SKIN ONCE A WEEK.     Endocrinology:  Diabetes - GLP-1 Receptor Agonists Passed - 12/02/2020 12:50 AM      Passed - HBA1C is between 0 and 7.9 and within 180 days    Hemoglobin A1C  Date Value Ref Range Status  08/12/2020 7.9 (A) 4.0 - 5.6 % Final   Hgb A1c MFr Bld  Date Value Ref Range Status  04/28/2018 7.5 (H) 4.8 - 5.6 % Final    Comment:             Prediabetes: 5.7 - 6.4          Diabetes: >6.4          Glycemic control for adults with diabetes: <7.0          Passed - Valid encounter within last 6 months    Recent Outpatient Visits          1 month ago [place] Jerrol Banana., MD   2 months ago Sinusitis, unspecified chronicity, unspecified location   [facility] Jerrol Ban[facility]., MD   3 months ago Subacute pansinusitis   [facility] Jerrol Banana., MD   3 months ago Encounter for annual health examination   [facility] Jerrol Ban[facility]., MD   7 months ago Type 2 diabetes mellitus without complication, without long-term current use of insulin [facility])   [facility] Jerrol Banana., MD      Future Appointments            In 2 months [facility], [place], MD [place]

## 2020-12-21 ENCOUNTER — Other Ambulatory Visit: Payer: Self-pay | Admitting: Family Medicine

## 2020-12-21 DIAGNOSIS — E119 Type 2 diabetes mellitus without complications: Secondary | ICD-10-CM

## 2020-12-21 DIAGNOSIS — L719 Rosacea, unspecified: Secondary | ICD-10-CM

## 2020-12-21 NOTE — Telephone Encounter (Signed)
Requested Prescriptions  Pending Prescriptions Disp Refills  . doxycycline (PERIOSTAT) 20 MG tablet [Pharmacy Med Name: DOXYCYCLINE HYCLATE 20 MG TAB] 180 tablet     Sig: TAKE 1 TABLET BY MOUTH TWICE A DAY     Off-Protocol Failed - 12/21/2020  6:40 PM      Failed - Medication not assigned to a protocol, review manually.      Passed - Valid encounter within last 12 months    Recent Outpatient Visits          2 months ago [place] Judith Fox., MD   3 months ago Sinusitis, unspecified chronicity, unspecified location   [facility]   3 months ago Subacute pansinusitis   [facility] Judith Fox., MD   4 months ago Encounter for annual health examination   [facility]., MD   8 months ago Type 2 diabetes mellitus without complication, without long-term current use of insulin [facility])   [facility] Judith [facility]., MD      Future Appointments            In 1 month [facility], [place], [place]           . metFORMIN (GLUCOPHAGE) 1000 MG tablet [Pharmacy Med Name: METFORMIN HCL 1,000 MG TABLET] 180 tablet     Sig: TAKE 1 TABLET BY MOUTH 2 TIMES DAILY WITH A MEAL.     Endocrinology:  Diabetes - Biguanides Passed - 12/21/2020  6:40 PM      Passed - Cr in normal range and within 360 days    Creatinine, Ser  Date Value Ref Range Status  01/24/2020 0.72 0.44 - 1.00 mg/dL Final         Passed - HBA1C is between 0 and 7.9 and within 180 days    Hemoglobin A1C  Date Value Ref Range Status  08/12/2020 7.9 (A) 4.0 - 5.6 % Final   Hgb A1c MFr Bld  Date Value Ref Range Status  04/28/2018 7.5 (H) 4.8 - 5.6 % Final    Comment:             Prediabetes: 5.7 - 6.4          Diabetes: >6.4          Glycemic control for adults with diabetes: <7.0          Passed - eGFR in normal range and within 360 days    GFR calc Af  Amer  Date Value Ref Range Status  01/24/2020 >60 >60 mL/min Final   GFR calc non Af Amer  Date Value Ref Range Status  01/24/2020 >60 >60 mL/min Final         Passed - Valid encounter within last 6 months    Recent Outpatient Visits          2 months ago [place] Judith Fox., MD   3 months ago Sinusitis, unspecified chronicity, unspecified location   [facility] Judith Fox., MD   3 months ago Subacute pansinusitis   [facility] Judith Fox., MD   4 months ago Encounter for annual health examination   [facility] Judith Fox., MD   8 months ago Type 2 diabetes mellitus without complication, without long-term current use of insulin [facility])   [place],  Judith Fox., MD      Future Appointments            In 1 month [facility], [place], MD [place]           . atorvastatin (LIPITOR) 40 MG tablet [Pharmacy Med Name: ATORVASTATIN 40 MG TABLET] 90 tablet     Sig: TAKE 1 TABLET BY MOUTH EVERY DAY     Cardiovascular:  Antilipid - Statins Failed - 12/21/2020  6:40 PM      Failed - Total Cholesterol in normal range and within 360 days    Cholesterol, Total  Date Value Ref Range Status  07/08/2020 96 (L) 100 - 199 mg/dL Final         Passed - LDL in normal range and within 360 days    LDL Chol Calc (NIH)  Date Value Ref Range Status  07/08/2020 32 0 - 99 mg/dL Final         Passed - HDL in normal range and within 360 days    HDL  Date Value Ref Range Status  07/08/2020 51 >39 mg/dL Final         Passed - Triglycerides in normal range and within 360 days    Triglycerides  Date Value Ref Range Status  07/08/2020 56 0 - 149 mg/dL Final         Passed - Patient is not pregnant      Passed - Valid encounter within last 12 months    Recent Outpatient Visits          2 months ago [place] Judith Fox., MD   3 months ago Sinusitis, unspecified chronicity, unspecified location   [facility] Judith Fox., MD   3 months ago Subacute pansinusitis   [facility] Judith Fox., MD   4 months ago Encounter for annual health examination   [facility] Judith Fox., MD   8 months ago Type 2 diabetes mellitus without complication, without long-term current use of insulin [facility])   [facility] Judith Fox., MD      Future Appointments            In 1 month [facility], [place], [place]

## 2020-12-21 NOTE — Telephone Encounter (Signed)
Requested medication (s) are due for refill today: yes  Requested medication (s) are on the active medication list: yes  Last refill:  08/27/20  Future visit scheduled: no  Notes to clinic:  med not assigned to a protocol   Requested Prescriptions  Pending Prescriptions Disp Refills   doxycycline (PERIOSTAT) 20 MG tablet [Pharmacy Med Name: DOXYCYCLINE HYCLATE 20 MG TAB] 180 tablet     Sig: TAKE 1 TABLET BY MOUTH TWICE A DAY      Off-Protocol Failed - 12/21/2020  6:40 PM      Failed - Medication not assigned to a protocol, review manually.      Passed - Valid encounter within last 12 months    Recent Outpatient Visits           2 months ago [place] Jerrol Banana., MD   3 months ago Sinusitis, unspecified chronicity, unspecified location   [facility] Jerrol Ban[facility]., MD   3 months ago Subacute pansinusitis   [facility] Jerrol Banana., MD   4 months ago Encounter for annual health examination   [facility] Jerrol Banana., MD   8 months ago Type 2 diabetes mellitus without complication, without long-term current use of insulin [facility])   [facility] Jerrol Banana., MD       Future Appointments             In 1 month [facility], [place], [place]              Refused Prescriptions Disp Refills   metFORMIN (GLUCOPHAGE) 1000 MG tablet [Pharmacy Med Name: METFORMIN HCL 1,000 MG TABLET] 180 tablet     Sig: TAKE 1 TABLET BY MOUTH 2 TIMES DAILY WITH A MEAL.      Endocrinology:  Diabetes - Biguanides Passed - 12/21/2020  6:40 PM      Passed - Cr in normal range and within 360 days    Creatinine, Ser  Date Value Ref Range Status  01/24/2020 0.72 0.44 - 1.00 mg/dL Final          Passed - HBA1C is between 0 and 7.9 and within 180 days    Hemoglobin A1C  Date Value Ref Range Status  08/12/2020 7.9 (A) 4.0 - 5.6 % Final   Hgb A1c MFr Bld   Date Value Ref Range Status  04/28/2018 7.5 (H) 4.8 - 5.6 % Final    Comment:             Prediabetes: 5.7 - 6.4          Diabetes: >6.4          Glycemic control for adults with diabetes: <7.0           Passed - eGFR in normal range and within 360 days    GFR calc Af Amer  Date Value Ref Range Status  01/24/2020 >60 >60 mL/min Final   GFR calc non Af Amer  Date Value Ref Range Status  01/24/2020 >60 >60 mL/min Final          Passed - Valid encounter within last 6 months    Recent Outpatient Visits           2 months ago [place] Jerrol Banana., MD   3 months ago Sinusitis, unspecified chronicity, unspecified location   [facility] Jer[facility] MD  3 months ago Subacute pansinusitis   [facility] Jerrol Ban[facility]., MD   4 months ago Encounter for annual health examination   [facility] Jer[facility] MD   8 months ago Type 2 diabetes mellitus without complication, without long-term current use of insulin [facility])   [facility] Jerrol Banana., MD       Future Appointments             In 1 month [facility], [place], [place]               atorvastatin (LIPITOR) 40 MG tablet [Pharmacy Med Name: ATORVASTATIN 40 MG TABLET] 90 tablet     Sig: TAKE 1 TABLET BY MOUTH EVERY DAY      Cardiovascular:  Antilipid - Statins Failed - 12/21/2020  6:40 PM      Failed - Total Cholesterol in normal range and within 360 days    Cholesterol, Total  Date Value Ref Range Status  07/08/2020 96 (L) 100 - 199 mg/dL Final          Passed - LDL in normal range and within 360 days    LDL Chol Calc (NIH)  Date Value Ref Range Status  07/08/2020 32 0 - 99 mg/dL Final          Passed - HDL in normal range and within 360 days    HDL  Date Value Ref Range Status  07/08/2020 51 >39 mg/dL Final          Passed - Triglycerides in normal range  and within 360 days    Triglycerides  Date Value Ref Range Status  07/08/2020 56 0 - 149 mg/dL Final          Passed - Patient is not pregnant      Passed - Valid encounter within last 12 months    Recent Outpatient Visits           2 months ago [place] Jerrol Banana., MD   3 months ago Sinusitis, unspecified chronicity, unspecified location   [facility] Jer[facility]., MD   3 months ago Subacute pansinusitis   [facility] Jerrol Banana., MD   4 months ago Encounter for annual health examination   [facility] Jerrol Banana., MD   8 months ago Type 2 diabetes mellitus without complication, without long-term current use of insulin [facility])   [facility] Jerrol Banana., MD       Future Appointments             In 1 month [facility], [place], [place]

## 2020-12-22 ENCOUNTER — Telehealth: Payer: Self-pay

## 2020-12-22 ENCOUNTER — Other Ambulatory Visit: Payer: Self-pay | Admitting: Family Medicine

## 2020-12-22 DIAGNOSIS — L719 Rosacea, unspecified: Secondary | ICD-10-CM

## 2020-12-22 DIAGNOSIS — E119 Type 2 diabetes mellitus without complications: Secondary | ICD-10-CM

## 2020-12-22 NOTE — Telephone Encounter (Signed)
Copied from [place] [phone]. Topic: Appointment Scheduling - Scheduling Inquiry for Clinic >> Dec 22, 2020  9:19 AM Yvette Rack wrote: Reason for CRM: Pt requests an in office appt with Dr. Rosanna Randy on 12/31/20 at 3pm but she failed DT Covid screening. Pt stated she had a fever recently when she was treated for a kidney infection. Pt requests call back for an in office appt.

## 2020-12-22 NOTE — Telephone Encounter (Signed)
Patient has a upcoming appointment on 12/31/2020 and states that it was kicked to a virtual appointment. Patient states that she does not have covid and had a low grade fever due to kidney infection. Patient needs to have her A1c checked and would like to have a in person appt.  Please advise

## 2020-12-22 NOTE — Telephone Encounter (Signed)
Copied from [place] [phone]. Topic: Quick Communication - Rx Refill/Question >> Dec 22, 2020  9:26 AM Yvette Rack wrote: Medication: doxycycline (PERIOSTAT) 20 MG tablet and glipiZIDE (GLUCOTROL) 10 MG tablet  Has the patient contacted their pharmacy? yes - Pt stated she received message from pharmacy stating request was denied  Preferred Pharmacy (with phone number or street name): [id number] [place] [address]  Phone: [phone]   Fax: [phone]  Agent: Please be advised that RX refills may take up to 3 business days. We ask that you follow-up with your pharmacy.

## 2020-12-22 NOTE — Telephone Encounter (Signed)
Future visit scheduled: yes   Notes to clinic: Pharmacy states that patient picked up the Glipizide on 11/22/2020 Patient states that she didn't pick up medication  Review for another fill    Requested Prescriptions  Pending Prescriptions Disp Refills   glipiZIDE (GLUCOTROL) 10 MG tablet 90 tablet 3    Sig: Take 1 tablet (10 mg total) by mouth daily before breakfast.      Endocrinology:  Diabetes - Sulfonylureas Passed - 12/22/2020  9:32 AM      Passed - HBA1C is between 0 and 7.9 and within 180 days    Hemoglobin A1C  Date Value Ref Range Status  08/12/2020 7.9 (A) 4.0 - 5.6 % Final   Hgb A1c MFr Bld  Date Value Ref Range Status  04/28/2018 7.5 (H) 4.8 - 5.6 % Final    Comment:             Prediabetes: 5.7 - 6.4          Diabetes: >6.4          Glycemic control for adults with diabetes: <7.0           Passed - Valid encounter within last 6 months    Recent Outpatient Visits           2 months ago [place] Jerrol Banana., MD   3 months ago Sinusitis, unspecified chronicity, unspecified location   [facility] MD   3 months ago Subacute pansinusitis   [facility] MD   4 months ago Encounter for annual health examination   [facility]., MD   8 months ago Type 2 diabetes mellitus without complication, without long-term current use of insulin [facility])   [facility] Jerrol Banana., MD       Future Appointments             In 1 week Jerrol Banana., MD [facility], [place]   In 1 month [place], [place], [place]               doxycycline (PERIOSTAT) 20 MG tablet 180 tablet 0    Sig: Take 1 tablet (20 mg total) by mouth 2 (two) times daily.      Off-Protocol Failed - 12/22/2020  9:32 AM      Failed - Medication not assigned to a protocol, review manually.       Passed - Valid encounter within last 12 months    Recent Outpatient Visits           2 months ago [place] Jerrol Banana., MD   3 months ago Sinusitis, unspecified chronicity, unspecified location   [facility] Jerrol Ban[facility]., MD   3 months ago Subacute pansinusitis   [facility] Jerrol Banana., MD   4 months ago Encounter for annual health examination   [facility] Jerrol Banana., MD   8 months ago Type 2 diabetes mellitus without complication, without long-term current use of insulin [facility])   [facility] Jerrol Banana., MD       Future Appointments             In 1 week Jerrol Banana., MD [facility], [place]   In 1 month [place], [place], [facility]

## 2020-12-22 NOTE — Telephone Encounter (Signed)
Patient due to come in for med refills. Did you want appt on 5/25 to be an in office visit or televisit?

## 2020-12-22 NOTE — Telephone Encounter (Signed)
Fyi.

## 2020-12-23 MED ORDER — DOXYCYCLINE HYCLATE 20 MG PO TABS: 20.0000 mg | ORAL_TABLET | Freq: Two times a day (BID) | ORAL | 0 refills | Status: DC

## 2020-12-23 MED ORDER — GLIPIZIDE 10 MG PO TABS: 10.0000 mg | ORAL_TABLET | Freq: Every day | ORAL | 3 refills | Status: DC

## 2020-12-23 NOTE — Telephone Encounter (Signed)
Patient was advised.  

## 2020-12-23 NOTE — Telephone Encounter (Signed)
Office thx

## 2020-12-31 ENCOUNTER — Telehealth (INDEPENDENT_AMBULATORY_CARE_PROVIDER_SITE_OTHER): Payer: 59 | Admitting: Family Medicine

## 2020-12-31 ENCOUNTER — Other Ambulatory Visit: Payer: Self-pay

## 2020-12-31 ENCOUNTER — Encounter: Payer: Self-pay | Admitting: Family Medicine

## 2020-12-31 VITALS — BP 133/82 | HR 93 | Resp 16 | Wt 189.6 lb

## 2020-12-31 DIAGNOSIS — K219 Gastro-esophageal reflux disease without esophagitis: Secondary | ICD-10-CM

## 2020-12-31 DIAGNOSIS — R3589 Other polyuria: Secondary | ICD-10-CM | POA: Diagnosis not present

## 2020-12-31 DIAGNOSIS — I10 Essential (primary) hypertension: Secondary | ICD-10-CM | POA: Diagnosis not present

## 2020-12-31 DIAGNOSIS — R5383 Other fatigue: Secondary | ICD-10-CM

## 2020-12-31 DIAGNOSIS — E119 Type 2 diabetes mellitus without complications: Secondary | ICD-10-CM | POA: Diagnosis not present

## 2020-12-31 DIAGNOSIS — E782 Mixed hyperlipidemia: Secondary | ICD-10-CM

## 2020-12-31 LAB — POCT GLYCOSYLATED HEMOGLOBIN (HGB A1C): Hemoglobin A1C: 6.6 % — AB (ref 4.0–5.6)

## 2020-12-31 LAB — POCT URINALYSIS DIPSTICK
Bilirubin, UA: NEGATIVE
Blood, UA: NEGATIVE
Glucose, UA: NEGATIVE
Ketones, UA: NEGATIVE
Leukocytes, UA: NEGATIVE
Nitrite, UA: NEGATIVE
Protein, UA: NEGATIVE
Spec Grav, UA: <=1.005 — AB (ref 1.010–1.025)
Urobilinogen, UA: 0.2 E.U./dL
pH, UA: 5 (ref 5.0–8.0)

## 2020-12-31 MED ORDER — CELECOXIB 200 MG PO CAPS
200.0000 mg | ORAL_CAPSULE | Freq: Every day | ORAL | 5 refills | Status: DC
Start: 2020-12-31 — End: 2021-02-16

## 2020-12-31 MED ORDER — ATORVASTATIN CALCIUM 40 MG PO TABS: 40.0000 mg | ORAL_TABLET | Freq: Every day | ORAL | 3 refills | Status: DC

## 2020-12-31 MED ORDER — METFORMIN HCL 1000 MG PO TABS: ORAL_TABLET | ORAL | 3 refills | Status: DC

## 2020-12-31 MED ORDER — OMEPRAZOLE 20 MG PO CPDR: DELAYED_RELEASE_CAPSULE | ORAL | 3 refills | Status: DC

## 2020-12-31 MED ORDER — BENAZEPRIL HCL 40 MG PO TABS: 40.0000 mg | ORAL_TABLET | Freq: Every day | ORAL | 3 refills | Status: DC

## 2020-12-31 NOTE — Progress Notes (Signed)
Established patient visit   Patient: Judith Fox   DOB: September 14, 1965   55 y.o. Female  MRN: [id number] Visit Date: 12/31/2020  Today's healthcare provider: Wilhemena Durie, MD   Chief Complaint  Patient presents with  . Diabetes  . Follow-up    Patient reports that she was seen at urgent care on 12/03/20 and 12/19/20 for dysuria patient was diagnosed with UTI, and urine culture grew out E.coli. Patient states that she did complete course of antibiotic but would like to have her urine checked today.   Subjective    HPI HPI    Follow-up    Comments: Patient reports that she was seen at urgent care on 12/03/20 and 12/19/20 for dysuria patient was diagnosed with UTI, and urine culture grew out E.coli. Patient states that she did complete course of antibiotic but would like to have her urine checked today.       Last edited by Minette Headland, CMA on 12/31/2020  3:16 PM. (History)      Diabetes Mellitus Type II, Follow-up  Lab Results  Component Value Date   HGBA1C 7.9 (A) 08/12/2020   HGBA1C 7.5 (A) 04/17/2020   HGBA1C 6.5 (A) 12/13/2019   Wt Readings from Last 3 Encounters:  12/31/20 189 lb 9.6 oz (86 kg)  11/13/20 185 lb (83.9 kg)  08/12/20 188 lb (85.3 kg)   Last seen for diabetes 2 months ago.  Management since then includes . She reports excellent compliance with treatment. She is not having side effects.   Symptoms: Yes fatigue No foot ulcerations  No appetite changes No nausea  No paresthesia of the feet  No polydipsia  Yes polyuria No visual disturbances   No vomiting     Home blood sugar records: not being recorded  Episodes of hypoglycemia? No    Current insulin regiment: none Most Recent Eye Exam: within the past year Current exercise: walking Current diet habits: in general, a "healthy" diet    Pertinent Labs: Lab Results  Component Value Date   CHOL 96 (L) 07/08/2020   HDL 51 07/08/2020   LDLCALC 32 07/08/2020   TRIG 56  07/08/2020   CHOLHDL 1.9 07/08/2020   Lab Results  Component Value Date   NA 139 01/24/2020   K 3.7 01/24/2020   CREATININE 0.72 01/24/2020   GFRNONAA >60 01/24/2020   GFRAA >60 01/24/2020   GLUCOSE 196 (H) 01/24/2020     ---------------------------------------------------------------------------------------------------  Patient Active Problem List   Diagnosis Date Noted  . Generalized weakness 01/23/2020  . Hypomagnesemia 01/23/2020  . Intestinal infection due to bacteria causing bloody diarrhea 01/23/2020  . IUD (intrauterine device) in place 10/25/2019  . Cyst of left ovary 10/25/2019  . Acute left-sided low back pain without sciatica 10/24/2019  . Left lower quadrant abdominal pain 10/23/2019  . Simple chronic bronchitis ([place]) 09/26/2019  . Varicose veins of both lower extremities with pain 05/16/2018  . Bilateral lower extremity edema 05/16/2018  . Mixed hyperlipidemia 09/05/2017  . Anxiety 06/26/2014  . Syncope and collapse 07/30/2013  . Palpitations 03/11/2013  . Type 2 diabetes mellitus without complication ([place]) 97/67/3419  . History of non-ST elevation myocardial infarction (NSTEMI) 03/01/2013  . HTN (hypertension) 03/01/2013  . 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  . Chronic pain syndrome 12/27/2011  . 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 Chronic migraine without aura 10/12/2011  . Migraine without aura 10/12/2011   Past Medical History:  Diagnosis Date  . Allergy   .  Arthritis    KNEE  . Asthma    "mild" per pt  . Complication of anesthesia    PT HAD A MI AFTER KNEE SURGERY IN 2014 FROM HIGH BLOOD PRESSURE-SURGERY WAS DONE AT AN OUTPATIENT SURGERY CENTER  . Diabetes mellitus without complication ([place])   . Dysrhythmia    sometimes, d/t stress, followed by cardiology  . History of hiatal hernia   . History of kidney stones   . Hypertension   . Migraine   . Myocardial infarction ([place]) 2014   no stents  . Pneumonia    h/o   Allergies   Allergen Reactions  . Latex Rash and Swelling    Eye swelling   . Butorphanol Other (See Comments)    drowsiness Somnolence Pt "knocked out for 12 hrs" when taking this   . Lactose Intolerance (Gi) Other (See Comments)    Reflux and Indigestion       Medications: Outpatient Medications Prior to Visit  Medication Sig  . albuterol (VENTOLIN HFA) 108 (90 Base) MCG/ACT inhaler Inhale 2 puffs into the lungs every 6 (six) hours as needed for wheezing or shortness of breath.  Marland Kitchen aspirin EC 81 MG tablet Take 81 mg by mouth daily.  Marland Kitchen atorvastatin (LIPITOR) 40 MG tablet Take 1 tablet (40 mg total) by mouth daily.  . benazepril (LOTENSIN) 40 MG tablet Take 1 tablet (40 mg total) by mouth daily.  Marland Kitchen CALCIUM-VITAMIN D PO Take 2 tablets by mouth daily.   . celecoxib (CELEBREX) 200 MG capsule Take 1 capsule (200 mg total) by mouth daily.  Marland Kitchen doxycycline (PERIOSTAT) 20 MG tablet Take 1 tablet (20 mg total) by mouth 2 (two) times daily.  . fexofenadine (ALLEGRA) 180 MG tablet Take 180 mg by mouth in the morning.  . fluconazole (DIFLUCAN) 150 MG tablet Take 150 mg by mouth once a week. And prn  . glipiZIDE (GLUCOTROL) 10 MG tablet Take 1 tablet (10 mg total) by mouth daily before breakfast.  . levonorgestrel (LILETTA) 19.5 MCG/DAY IUD IUD by Intrauterine route.  . meclizine (ANTIVERT) 25 MG tablet Take 1 tablet (25 mg total) by mouth 3 (three) times daily as needed for dizziness.  . metFORMIN (GLUCOPHAGE) 1000 MG tablet TAKE 1 TABLET BY MOUTH 2 TIMES DAILY WITH A MEAL.  Marland Kitchen metoCLOPramide (REGLAN) 5 MG/ML injection Inject 10 mg into the vein daily as needed (migraine).   . Multiple Vitamin (MULTIVITAMIN) capsule Take 1 capsule by mouth daily.  . naproxen (NAPROSYN) 500 MG tablet Take 1 tablet (500 mg total) by mouth 2 (two) times daily as needed.  . nitroGLYCERIN (NITROSTAT) 0.4 MG SL tablet Place 1 tablet (0.4 mg total) under the tongue every 5 (five) minutes as needed for chest pain.  Marland Kitchen omeprazole  (PRILOSEC) 20 MG capsule TAKE 1 CAPSULE BY MOUTH 2 TIMES DAILY BEFORE A MEAL.  Marland Kitchen ondansetron (ZOFRAN ODT) 4 MG disintegrating tablet Take 1 tablet (4 mg total) by mouth every 8 (eight) hours as needed for nausea or vomiting.  Marland Kitchen oxyCODONE (OXY IR/ROXICODONE) 5 MG immediate release tablet Take 1 tablet (5 mg total) by mouth every 4 (four) hours as needed for severe pain.  Marland Kitchen prochlorperazine (COMPAZINE) 10 MG tablet Take 1 tablet (10 mg total) by mouth every 6 (six) hours as needed for nausea or vomiting (migraine).  . promethazine (PHENERGAN) 25 MG tablet Take 1 tablet (25 mg total) by mouth every 6 (six) hours as needed for nausea or vomiting.  . RESTASIS 0.05 % ophthalmic emulsion  Place 1 drop into both eyes 2 (two) times daily.   . traMADol (ULTRAM) 50 MG tablet Take 50 mg by mouth every 6 (six) hours as needed.  . triamcinolone cream (KENALOG) 0.1 % Apply topically.  . TRULICITY 6.64 QI/3.4VQ SOPN INJECT 0.75 MG INTO THE SKIN ONCE A WEEK.  . [DISCONTINUED] amoxicillin-clavulanate (AUGMENTIN) 875-125 MG tablet Take 1 tablet by mouth 2 (two) times daily. (Patient not taking: Reported on 11/13/2020)  . [DISCONTINUED] azithromycin (ZITHROMAX) 250 MG tablet Take 2 tablets by mouth today, then 1 tablet by mouth daily. (Patient not taking: Reported on 11/13/2020)  . [DISCONTINUED] predniSONE (STERAPRED UNI-PAK 21 TAB) 10 MG (21) TBPK tablet Taper as directed. (Patient not taking: Reported on 11/13/2020)   No facility-administered medications prior to visit.    Review of Systems      Objective    BP 133/82   Pulse 93   Resp 16   Wt 189 lb 9.6 oz (86 kg)   BMI 28.00 kg/m  BP Readings from Last 3 Encounters:  12/31/20 133/82  11/13/20 (!) 159/93  10/10/20 (!) 135/96   Wt Readings from Last 3 Encounters:  12/31/20 189 lb 9.6 oz (86 kg)  11/13/20 185 lb (83.9 kg)  08/12/20 188 lb (85.3 kg)       Physical Exam Vitals reviewed.  Constitutional:      Appearance: Normal appearance. She is  well-developed and normal weight.  HENT:     Head: Normocephalic.     Right Ear: External ear normal.     Left Ear: External ear normal.     Nose: Nose normal.     Mouth/Throat:     Pharynx: No oropharyngeal exudate.  Eyes:     Conjunctiva/sclera: Conjunctivae normal.     Pupils: Pupils are equal, round, and reactive to light.  Cardiovascular:     Rate and Rhythm: Normal rate and regular rhythm.     Heart sounds: Normal heart sounds.  Pulmonary:     Effort: Pulmonary effort is normal. No respiratory distress.     Breath sounds: Normal breath sounds.  Abdominal:     Palpations: Abdomen is soft.  Musculoskeletal:        General: No tenderness.     Cervical back: Normal range of motion and neck supple.  Lymphadenopathy:     Cervical: No cervical adenopathy.  Skin:    General: Skin is warm and dry.  Neurological:     General: No focal deficit present.     Mental Status: She is alert and oriented to person, place, and time. Mental status is at baseline.  Psychiatric:        Mood and Affect: Mood normal.        Behavior: Behavior normal.        Thought Content: Thought content normal.        Judgment: Judgment normal.       No results found for any visits on 12/31/20.  Assessment & Plan     1. Type 2 diabetes mellitus without complication, without long-term current use of insulin (HCC) A1c is 6.6 under good control on metformin maximum dose with glipizide 10 most recently Trulicity - POCT glycosylated hemoglobin (Hb A1C)  2. Polyuria UA appears to be negative. - POCT urinalysis dipstick  3. Fatigue, unspecified type  - CK - CBC with Differential/Platelet - TSH  4. Essential hypertension Good control on benazepril 40 - Comprehensive metabolic panel  5. Mixed hyperlipidemia On atorvastatin 40 - Lipid panel  6. Diabetes mellitus without complication (HCC)  - metFORMIN (GLUCOPHAGE) 1000 MG tablet; TAKE 1 TABLET BY MOUTH 2 TIMES DAILY WITH A MEAL.  Dispense: 180  tablet; Refill: 3  7. Gastroesophageal reflux disease  - omeprazole (PRILOSEC) 20 MG capsule; TAKE 1 CAPSULE BY MOUTH 2 TIMES DAILY BEFORE A MEAL.  Dispense: 180 capsule; Refill: 3   No follow-ups on file.      I, Wilhemena Durie, MD, have reviewed all documentation for this visit. The documentation on 01/05/21 for the exam, diagnosis, procedures, and orders are all accurate and complete.    Carrel Leather Cranford Mon, MD  [facility] [phone] (phone) [phone] (fax)  [place]

## 2021-01-03 ENCOUNTER — Other Ambulatory Visit: Payer: Self-pay | Admitting: Family Medicine

## 2021-01-03 DIAGNOSIS — I519 Heart disease, unspecified: Secondary | ICD-10-CM

## 2021-01-03 NOTE — Telephone Encounter (Signed)
Called pharmacy ([facility]) and there were no more refills.  Requested Prescriptions  Pending Prescriptions Disp Refills  . nitroGLYCERIN (NITROSTAT) 0.4 MG SL tablet [Pharmacy Med Name: NITROGLYCERIN 0.4 MG TABLET SL] 150 tablet 1    Sig: PLACE 1 TABLET UNDER THE TONGUE EVERY 5 MINUTES AS NEEDED FOR CHEST PAIN.     Cardiovascular:  Nitrates Passed - 01/03/2021  9:26 AM      Passed - Last BP in normal range    BP Readings from Last 1 Encounters:  12/31/20 133/82         Passed - Last Heart Rate in normal range    Pulse Readings from Last 1 Encounters:  12/31/20 93         Passed - Valid encounter within last 12 months    Recent Outpatient Visits          3 days ago Type 2 diabetes mellitus without complication, without long-term current use of insulin [facility])   [facility] Jerrol Banana., MD   2 months ago [place] Jerrol Banana., MD   3 months ago Sinusitis, unspecified chronicity, unspecified location   [facility] Jerrol Banana., MD   4 months ago Subacute pansinusitis   [facility] Jerrol Banana., MD   4 months ago Encounter for annual health examination   [facility] Jerrol Banana., MD      Future Appointments            In 1 month [facility], [place], MD [place]   In 4 months Jerrol Banana., MD [facility], [place]

## 2021-01-20 ENCOUNTER — Ambulatory Visit (INDEPENDENT_AMBULATORY_CARE_PROVIDER_SITE_OTHER): Payer: 59 | Admitting: Podiatry

## 2021-01-20 ENCOUNTER — Other Ambulatory Visit: Payer: Self-pay

## 2021-01-20 DIAGNOSIS — L209 Atopic dermatitis, unspecified: Secondary | ICD-10-CM | POA: Diagnosis not present

## 2021-01-20 DIAGNOSIS — L719 Rosacea, unspecified: Secondary | ICD-10-CM | POA: Diagnosis not present

## 2021-01-20 DIAGNOSIS — A499 Bacterial infection, unspecified: Secondary | ICD-10-CM

## 2021-01-20 DIAGNOSIS — B49 Unspecified mycosis: Secondary | ICD-10-CM | POA: Diagnosis not present

## 2021-01-20 MED ORDER — DOXYCYCLINE HYCLATE 20 MG PO TABS: 20.0000 mg | ORAL_TABLET | Freq: Two times a day (BID) | ORAL | 0 refills | Status: DC

## 2021-01-20 MED ORDER — DOXYCYCLINE HYCLATE 100 MG PO TABS: 100.0000 mg | ORAL_TABLET | Freq: Two times a day (BID) | ORAL | 0 refills | Status: AC

## 2021-01-20 MED ORDER — METHYLPREDNISOLONE 4 MG PO TBPK: ORAL_TABLET | ORAL | 0 refills | Status: DC

## 2021-01-20 MED ORDER — TERBINAFINE HCL 250 MG PO TABS: 250.0000 mg | ORAL_TABLET | Freq: Every day | ORAL | 0 refills | Status: AC

## 2021-01-20 NOTE — Progress Notes (Signed)
Oxy

## 2021-01-22 ENCOUNTER — Encounter: Payer: Self-pay | Admitting: Podiatry

## 2021-01-22 NOTE — Progress Notes (Signed)
Subjective:  Patient ID: Judith Fox, female    DOB: 1966-01-16,  MRN: [id number]  Chief Complaint  Patient presents with   Nail Problem    Right hallux toe infection     55 y.o. female presents with the above complaint.  Patient presents with complaint of right hallux infection/epidermolysis with redness.  She states that she went to [place] and afterwards he started getting some paronychia/redness.  She states that has been going on for 4 to 5 days and is starting to spread and the rash is going to other toes as well.  She states the skin is peeling.  She states that her toe is also blistering and the blister will pop and come back.  She denies any other acute complaints she has not seen anyone else prior to seeing me for this.  She would like to discuss treatment options for this.   Review of Systems: Negative except as noted in the HPI. Denies N/V/F/Ch.  Past Medical History:  Diagnosis Date   Allergy    Arthritis    KNEE   Asthma    "mild" per pt   Complication of anesthesia    PT HAD A MI AFTER KNEE SURGERY IN 2014 FROM HIGH BLOOD PRESSURE-SURGERY WAS DONE AT AN OUTPATIENT SURGERY CENTER   Diabetes mellitus without complication ([place])    Dysrhythmia    sometimes, d/t stress, followed by cardiology   History of hiatal hernia    History of kidney stones    Hypertension    Migraine    Myocardial infarction ([place]) 2014   no stents   Pneumonia    h/o    Current Outpatient Medications:    doxycycline (VIBRA-TABS) 100 MG tablet, Take 1 tablet (100 mg total) by mouth 2 (two) times daily for 14 days., Disp: 28 tablet, Rfl: 0   methylPREDNISolone (MEDROL DOSEPAK) 4 MG TBPK tablet, Take as directed, Disp: 21 each, Rfl: 0   terbinafine (LAMISIL) 250 MG tablet, Take 1 tablet (250 mg total) by mouth daily for 14 days., Disp: 14 tablet, Rfl: 0   albuterol (VENTOLIN HFA) 108 (90 Base) MCG/ACT inhaler, Inhale 2 puffs into the lungs every 6 (six) hours as needed for wheezing or  shortness of breath., Disp: 8 g, Rfl: 2   aspirin EC 81 MG tablet, Take 81 mg by mouth daily., Disp: , Rfl:    atorvastatin (LIPITOR) 40 MG tablet, Take 1 tablet (40 mg total) by mouth daily., Disp: 90 tablet, Rfl: 3   benazepril (LOTENSIN) 40 MG tablet, Take 1 tablet (40 mg total) by mouth daily., Disp: 90 tablet, Rfl: 3   CALCIUM-VITAMIN D PO, Take 2 tablets by mouth daily. , Disp: , Rfl:    celecoxib (CELEBREX) 200 MG capsule, Take 1 capsule (200 mg total) by mouth daily., Disp: 30 capsule, Rfl: 5   doxycycline (PERIOSTAT) 20 MG tablet, Take 1 tablet (20 mg total) by mouth 2 (two) times daily., Disp: 180 tablet, Rfl: 0   fexofenadine (ALLEGRA) 180 MG tablet, Take 180 mg by mouth in the morning., Disp: , Rfl:    fluconazole (DIFLUCAN) 150 MG tablet, Take 150 mg by mouth once a week. And prn, Disp: , Rfl:    glipiZIDE (GLUCOTROL) 10 MG tablet, Take 1 tablet (10 mg total) by mouth daily before breakfast., Disp: 90 tablet, Rfl: 3   levonorgestrel (LILETTA) 19.5 MCG/DAY IUD IUD, by Intrauterine route., Disp: , Rfl:    meclizine (ANTIVERT) 25 MG tablet, Take 1 tablet (25 mg  total) by mouth 3 (three) times daily as needed for dizziness., Disp: 30 tablet, Rfl: 2   metFORMIN (GLUCOPHAGE) 1000 MG tablet, TAKE 1 TABLET BY MOUTH 2 TIMES DAILY WITH A MEAL., Disp: 180 tablet, Rfl: 3   metoCLOPramide (REGLAN) 5 MG/ML injection, Inject 10 mg into the vein daily as needed (migraine). , Disp: , Rfl:    Multiple Vitamin (MULTIVITAMIN) capsule, Take 1 capsule by mouth daily., Disp: , Rfl:    naproxen (NAPROSYN) 500 MG tablet, Take 1 tablet (500 mg total) by mouth 2 (two) times daily as needed., Disp: 180 tablet, Rfl: 1   nitroGLYCERIN (NITROSTAT) 0.4 MG SL tablet, PLACE 1 TABLET UNDER THE TONGUE EVERY 5 MINUTES AS NEEDED FOR CHEST PAIN., Disp: 150 tablet, Rfl: 1   omeprazole (PRILOSEC) 20 MG capsule, TAKE 1 CAPSULE BY MOUTH 2 TIMES DAILY BEFORE A MEAL., Disp: 180 capsule, Rfl: 3   ondansetron (ZOFRAN ODT) 4 MG  disintegrating tablet, Take 1 tablet (4 mg total) by mouth every 8 (eight) hours as needed for nausea or vomiting., Disp: 20 tablet, Rfl: 0   oxyCODONE (OXY IR/ROXICODONE) 5 MG immediate release tablet, Take 1 tablet (5 mg total) by mouth every 4 (four) hours as needed for severe pain., Disp: 20 tablet, Rfl: 0   prochlorperazine (COMPAZINE) 10 MG tablet, Take 1 tablet (10 mg total) by mouth every 6 (six) hours as needed for nausea or vomiting (migraine)., Disp: 15 tablet, Rfl: 0   promethazine (PHENERGAN) 25 MG tablet, Take 1 tablet (25 mg total) by mouth every 6 (six) hours as needed for nausea or vomiting., Disp: 35 tablet, Rfl: 0   RESTASIS 0.05 % ophthalmic emulsion, Place 1 drop into both eyes 2 (two) times daily. , Disp: , Rfl:    traMADol (ULTRAM) 50 MG tablet, Take 50 mg by mouth every 6 (six) hours as needed., Disp: , Rfl:    triamcinolone cream (KENALOG) 0.1 %, Apply topically., Disp: , Rfl:    TRULICITY 9.50 DT/2.6ZT SOPN, INJECT 0.75 MG INTO THE SKIN ONCE A WEEK., Disp: 6 mL, Rfl: 0  Social History   Tobacco Use  Smoking Status Never  Smokeless Tobacco Never    Allergies  Allergen Reactions   Latex Rash and Swelling    Eye swelling    Butorphanol Other (See Comments)    drowsiness Somnolence Pt "knocked out for 12 hrs" when taking this    Lactose Intolerance (Gi) Other (See Comments)    Reflux and Indigestion   Objective:  There were no vitals filed for this visit. There is no height or weight on file to calculate BMI. Constitutional Well developed. Well nourished.  Vascular Dorsalis pedis pulses palpable bilaterally. Posterior tibial pulses palpable bilaterally. Capillary refill normal to all digits.  No cyanosis or clubbing noted. Pedal hair growth normal.  Neurologic Normal speech. Oriented to person, place, and time. Epicritic sensation to light touch grossly present bilaterally.  Dermatologic Right hallux nail within normal limits.  The nail is attached to  the nailbed.  Blister formation noted to the right hallux with epidermolysis/redness.  This seems to be spreading to other digits.  Mild component of itching noted with it.  Orthopedic: Normal joint ROM without pain or crepitus bilaterally. No visible deformities. No bony tenderness.   Radiographs: None Assessment:   1. Atopic dermatitis, unspecified type   2. Rosacea   3. Fungal infection   4. Bacterial infection    Plan:  Patient was evaluated and treated and all questions answered.  Right hallux dermatitis versus bacterial infection versus fungal fungal infection -I explained the patient the etiology of dermatitis and various treatment options were discussed.  Patient does have a history of rosacea however she does not have any history of psoriatic psoriasis.  I discussed with her that this is presenting in various different ways.  At this time I believe patient will benefit from multiple medications to help calm this down.  I will place her on doxycycline and Lamisil to help with the antibacterial and antifungal component of it.  I also believe she will benefit from a Medrol Dosepak in case if it is vasculitis versus atopic dermatitis.  I discussed this with the patient in extensive detail she states understanding.   No follow-ups on file.

## 2021-01-27 ENCOUNTER — Ambulatory Visit (INDEPENDENT_AMBULATORY_CARE_PROVIDER_SITE_OTHER): Payer: 59 | Admitting: Podiatry

## 2021-01-27 ENCOUNTER — Other Ambulatory Visit: Payer: Self-pay

## 2021-01-27 DIAGNOSIS — L719 Rosacea, unspecified: Secondary | ICD-10-CM | POA: Diagnosis not present

## 2021-01-27 DIAGNOSIS — B49 Unspecified mycosis: Secondary | ICD-10-CM | POA: Diagnosis not present

## 2021-01-27 DIAGNOSIS — L209 Atopic dermatitis, unspecified: Secondary | ICD-10-CM | POA: Diagnosis not present

## 2021-01-27 DIAGNOSIS — A499 Bacterial infection, unspecified: Secondary | ICD-10-CM

## 2021-01-28 ENCOUNTER — Encounter: Payer: Self-pay | Admitting: Podiatry

## 2021-01-28 NOTE — Progress Notes (Signed)
Subjective:  Patient ID: Judith Fox, female    DOB: 02-08-1966,  MRN: [id number]  Chief Complaint  Patient presents with   Foot Pain    Rash follow up     55 y.o. female presents with the above complaint.  Patient presents with a follow-up of right hallux infection/epidermolysis with possible atopic dermatitis.  She got it from [place].  She states she is doing a lot better it does look much better.  She has not seen anyone else for this.  She has completed her Medrol Dosepak but she is to continue taking doxycycline and Lamisil.   Review of Systems: Negative except as noted in the HPI. Denies N/V/F/Ch.  Past Medical History:  Diagnosis Date   Allergy    Arthritis    KNEE   Asthma    "mild" per pt   Complication of anesthesia    PT HAD A MI AFTER KNEE SURGERY IN 2014 FROM HIGH BLOOD PRESSURE-SURGERY WAS DONE AT AN OUTPATIENT SURGERY CENTER   Diabetes mellitus without complication ([place])    Dysrhythmia    sometimes, d/t stress, followed by cardiology   History of hiatal hernia    History of kidney stones    Hypertension    Migraine    Myocardial infarction ([place]) 2014   no stents   Pneumonia    h/o    Current Outpatient Medications:    albuterol (VENTOLIN HFA) 108 (90 Base) MCG/ACT inhaler, Inhale 2 puffs into the lungs every 6 (six) hours as needed for wheezing or shortness of breath., Disp: 8 g, Rfl: 2   aspirin EC 81 MG tablet, Take 81 mg by mouth daily., Disp: , Rfl:    atorvastatin (LIPITOR) 40 MG tablet, Take 1 tablet (40 mg total) by mouth daily., Disp: 90 tablet, Rfl: 3   benazepril (LOTENSIN) 40 MG tablet, Take 1 tablet (40 mg total) by mouth daily., Disp: 90 tablet, Rfl: 3   CALCIUM-VITAMIN D PO, Take 2 tablets by mouth daily. , Disp: , Rfl:    celecoxib (CELEBREX) 200 MG capsule, Take 1 capsule (200 mg total) by mouth daily., Disp: 30 capsule, Rfl: 5   doxycycline (PERIOSTAT) 20 MG tablet, Take 1 tablet (20 mg total) by mouth 2 (two) times daily.,  Disp: 180 tablet, Rfl: 0   doxycycline (VIBRA-TABS) 100 MG tablet, Take 1 tablet (100 mg total) by mouth 2 (two) times daily for 14 days., Disp: 28 tablet, Rfl: 0   fexofenadine (ALLEGRA) 180 MG tablet, Take 180 mg by mouth in the morning., Disp: , Rfl:    fluconazole (DIFLUCAN) 150 MG tablet, Take 150 mg by mouth once a week. And prn, Disp: , Rfl:    glipiZIDE (GLUCOTROL) 10 MG tablet, Take 1 tablet (10 mg total) by mouth daily before breakfast., Disp: 90 tablet, Rfl: 3   levonorgestrel (LILETTA) 19.5 MCG/DAY IUD IUD, by Intrauterine route., Disp: , Rfl:    meclizine (ANTIVERT) 25 MG tablet, Take 1 tablet (25 mg total) by mouth 3 (three) times daily as needed for dizziness., Disp: 30 tablet, Rfl: 2   metFORMIN (GLUCOPHAGE) 1000 MG tablet, TAKE 1 TABLET BY MOUTH 2 TIMES DAILY WITH A MEAL., Disp: 180 tablet, Rfl: 3   methylPREDNISolone (MEDROL DOSEPAK) 4 MG TBPK tablet, Take as directed, Disp: 21 each, Rfl: 0   metoCLOPramide (REGLAN) 5 MG/ML injection, Inject 10 mg into the vein daily as needed (migraine). , Disp: , Rfl:    Multiple Vitamin (MULTIVITAMIN) capsule, Take 1 capsule by mouth daily.,  Disp: , Rfl:    naproxen (NAPROSYN) 500 MG tablet, Take 1 tablet (500 mg total) by mouth 2 (two) times daily as needed., Disp: 180 tablet, Rfl: 1   nitroGLYCERIN (NITROSTAT) 0.4 MG SL tablet, PLACE 1 TABLET UNDER THE TONGUE EVERY 5 MINUTES AS NEEDED FOR CHEST PAIN., Disp: 150 tablet, Rfl: 1   omeprazole (PRILOSEC) 20 MG capsule, TAKE 1 CAPSULE BY MOUTH 2 TIMES DAILY BEFORE A MEAL., Disp: 180 capsule, Rfl: 3   ondansetron (ZOFRAN ODT) 4 MG disintegrating tablet, Take 1 tablet (4 mg total) by mouth every 8 (eight) hours as needed for nausea or vomiting., Disp: 20 tablet, Rfl: 0   oxyCODONE (OXY IR/ROXICODONE) 5 MG immediate release tablet, Take 1 tablet (5 mg total) by mouth every 4 (four) hours as needed for severe pain., Disp: 20 tablet, Rfl: 0   prochlorperazine (COMPAZINE) 10 MG tablet, Take 1 tablet (10 mg  total) by mouth every 6 (six) hours as needed for nausea or vomiting (migraine)., Disp: 15 tablet, Rfl: 0   promethazine (PHENERGAN) 25 MG tablet, Take 1 tablet (25 mg total) by mouth every 6 (six) hours as needed for nausea or vomiting., Disp: 35 tablet, Rfl: 0   RESTASIS 0.05 % ophthalmic emulsion, Place 1 drop into both eyes 2 (two) times daily. , Disp: , Rfl:    terbinafine (LAMISIL) 250 MG tablet, Take 1 tablet (250 mg total) by mouth daily for 14 days., Disp: 14 tablet, Rfl: 0   traMADol (ULTRAM) 50 MG tablet, Take 50 mg by mouth every 6 (six) hours as needed., Disp: , Rfl:    triamcinolone cream (KENALOG) 0.1 %, Apply topically., Disp: , Rfl:    TRULICITY 9.55 YO/3.7CH SOPN, INJECT 0.75 MG INTO THE SKIN ONCE A WEEK., Disp: 6 mL, Rfl: 0  Social History   Tobacco Use  Smoking Status Never  Smokeless Tobacco Never    Allergies  Allergen Reactions   Latex Rash and Swelling    Eye swelling    Butorphanol Other (See Comments)    drowsiness Somnolence Pt "knocked out for 12 hrs" when taking this    Lactose Intolerance (Gi) Other (See Comments)    Reflux and Indigestion   Objective:  There were no vitals filed for this visit. There is no height or weight on file to calculate BMI. Constitutional Well developed. Well nourished.  Vascular Dorsalis pedis pulses palpable bilaterally. Posterior tibial pulses palpable bilaterally. Capillary refill normal to all digits.  No cyanosis or clubbing noted. Pedal hair growth normal.  Neurologic Normal speech. Oriented to person, place, and time. Epicritic sensation to light touch grossly present bilaterally.  Dermatologic Right hallux nail within normal limits.  The nail is attached to the nailbed.  No further blister formation noted to the right hallux without epidermolysis/redness.  This seems to be spreading to other digits.  Mild component of itching noted with it.  Orthopedic: Normal joint ROM without pain or crepitus bilaterally. No  visible deformities. No bony tenderness.   Radiographs: None Assessment:   1. Rosacea   2. Atopic dermatitis, unspecified type   3. Fungal infection   4. Bacterial infection     Plan:  Patient was evaluated and treated and all questions answered.  Right hallux dermatitis versus bacterial infection versus fungal fungal infection -Clinically improving this may have been likely infectious component with epidermal lysis.  I encouraged her to complete her antibiotics.  At this time it has improved considerably.  I discussed with her that if it  regresses let me know right away otherwise I will see her for final follow-up in few weeks.  She can hold off on doing Betadine dressings.  She can go back to utilizing normal shoes. -Completed doxycycline and Lamisil course.  She states understanding   No follow-ups on file.

## 2021-01-30 ENCOUNTER — Telehealth: Payer: Self-pay | Admitting: *Deleted

## 2021-01-30 MED ORDER — DOXYCYCLINE HYCLATE 100 MG PO TABS: 100.0000 mg | ORAL_TABLET | Freq: Two times a day (BID) | ORAL | 0 refills | Status: AC

## 2021-01-30 MED ORDER — TERBINAFINE HCL 250 MG PO TABS: 250.0000 mg | ORAL_TABLET | Freq: Every day | ORAL | 0 refills | Status: AC

## 2021-01-30 NOTE — Telephone Encounter (Signed)
"  [place].  I had five days left of the prescriptions.  I'm calling to see if I can get a 5 day supply sent to my pharmacy."  What medications do you need?  "I need the Terbinafine and the Doxycycline."  I will send the message to the on-call provider.  Dr. Posey Pronto is not in the office today.  What pharmacy do you use?  "I use [facility] on [address]

## 2021-01-30 NOTE — Telephone Encounter (Signed)
I'm calling to let you know that Dr. Sherryle Lis sent your refill to [place].  "Thank you so much, I have already picked it up."

## 2021-02-03 ENCOUNTER — Ambulatory Visit: Payer: 59 | Admitting: Podiatry

## 2021-02-04 ENCOUNTER — Other Ambulatory Visit: Payer: Self-pay | Admitting: Family Medicine

## 2021-02-04 DIAGNOSIS — E119 Type 2 diabetes mellitus without complications: Secondary | ICD-10-CM

## 2021-02-04 NOTE — Telephone Encounter (Signed)
Requested medication (s) are due for refill today: Yes  Requested medication (s) are on the active medication list: Yes  Last refill:  2 months ago  Future visit scheduled: Yes  Notes to clinic:  Diagnosis code needed     Requested Prescriptions  Pending Prescriptions Disp Refills   TRULICITY 4.76 LY/6.5KP SOPN [Pharmacy Med Name: TRULICITY 5.46 FK/8.1 ML PEN]      Sig: INJECT 0.75 MG INTO THE SKIN ONCE A WEEK.      Endocrinology:  Diabetes - GLP-1 Receptor Agonists Passed - 02/04/2021  3:59 PM      Passed - HBA1C is between 0 and 7.9 and within 180 days    Hemoglobin A1C  Date Value Ref Range Status  12/31/2020 6.6 (A) 4.0 - 5.6 % Final   Hgb A1c MFr Bld  Date Value Ref Range Status  04/28/2018 7.5 (H) 4.8 - 5.6 % Final    Comment:             Prediabetes: 5.7 - 6.4          Diabetes: >6.4          Glycemic control for adults with diabetes: <7.0           Passed - Valid encounter within last 6 months    Recent Outpatient Visits           1 month ago Type 2 diabetes mellitus without complication, without long-term current use of insulin [facility])   [facility] Jerrol Banana., MD   3 months ago [place] Jerrol Banana., MD   4 months ago Sinusitis, unspecified chronicity, unspecified location   [facility] Jerrol Banana., MD   5 months ago Subacute pansinusitis   [facility] Jerrol Banana., MD   5 months ago Encounter for annual health examination   [facility] Jerrol Banana., MD       Future Appointments             In 1 week [facility], [place], MD [place]   In 2 months Jerrol Banana., MD [facility], [place]

## 2021-02-12 ENCOUNTER — Ambulatory Visit: Payer: 59 | Admitting: Dermatology

## 2021-02-12 ENCOUNTER — Other Ambulatory Visit: Payer: Self-pay

## 2021-02-12 DIAGNOSIS — Z86006 Personal history of melanoma in-situ: Secondary | ICD-10-CM

## 2021-02-12 DIAGNOSIS — L814 Other melanin hyperpigmentation: Secondary | ICD-10-CM | POA: Diagnosis not present

## 2021-02-12 DIAGNOSIS — D1800 Hemangioma unspecified site: Secondary | ICD-10-CM

## 2021-02-12 DIAGNOSIS — I8310 Varicose veins of unspecified lower extremity with inflammation: Secondary | ICD-10-CM

## 2021-02-12 DIAGNOSIS — Z1283 Encounter for screening for malignant neoplasm of skin: Secondary | ICD-10-CM

## 2021-02-12 DIAGNOSIS — L719 Rosacea, unspecified: Secondary | ICD-10-CM | POA: Diagnosis not present

## 2021-02-12 DIAGNOSIS — L578 Other skin changes due to chronic exposure to nonionizing radiation: Secondary | ICD-10-CM

## 2021-02-12 DIAGNOSIS — L821 Other seborrheic keratosis: Secondary | ICD-10-CM

## 2021-02-12 DIAGNOSIS — D229 Melanocytic nevi, unspecified: Secondary | ICD-10-CM

## 2021-02-12 MED ORDER — DOXYCYCLINE HYCLATE 20 MG PO TABS: 20.0000 mg | ORAL_TABLET | Freq: Two times a day (BID) | ORAL | 1 refills | Status: DC

## 2021-02-12 NOTE — Patient Instructions (Addendum)
Melanoma ABCDEs  Melanoma is the most dangerous type of skin cancer, and is the leading cause of death from skin disease.  You are more likely to develop melanoma if you: Have light-colored skin, light-colored eyes, or red or blond hair Spend a lot of time in the sun Tan regularly, either outdoors or in a tanning bed Have had blistering sunburns, especially during childhood Have a close family member who has had a melanoma Have atypical moles or large birthmarks  Early detection of melanoma is key since treatment is typically straightforward and cure rates are extremely high if we catch it early.   The first sign of melanoma is often a change in a mole or a new dark spot.  The ABCDE system is a way of remembering the signs of melanoma.  A for asymmetry:  The two halves do not match. B for border:  The edges of the growth are irregular. C for color:  A mixture of colors are present instead of an even brown color. D for diameter:  Melanomas are usually (but not always) greater than 30mm - the size of a pencil eraser. E for evolution:  The spot keeps changing in size, shape, and color.  Please check your skin once per month between visits. You can use a small mirror in front and a large mirror behind you to keep an eye on the back side or your body.   If you see any new or changing lesions before your next follow-up, please call to schedule a visit.  Please continue daily skin protection including broad spectrum sunscreen SPF 30+ to sun-exposed areas, reapplying every 2 hours as needed when you're outdoors.    Recommend taking Heliocare sun protection supplement daily in sunny weather for additional sun protection. For maximum protection on the sunniest days, you can take up to 2 capsules of regular Heliocare OR take 1 capsule of Heliocare Ultra. For prolonged exposure (such as a full day in the sun), you can repeat your dose of the supplement 4 hours after your first dose. Heliocare can be  purchased at [facility] or at [email or web address].   Doxycycline should be taken with food to prevent nausea. Do not lay down for 30 minutes after taking. Be cautious with sun exposure and use good sun protection while on this medication. Pregnant women should not take this medication.   If you have any questions or concerns for your doctor, please call our main line at [phone] and press option 4 to reach your doctor's medical assistant. If no one answers, please leave a voicemail as directed and we will return your call as soon as possible. Messages left after 4 pm will be answered the following business day.   You may also send [place] a message via [place]. We typically respond to MyChart messages within 1-2 business days.  For prescription refills, please ask your pharmacy to contact our office. Our fax number is [phone].  If you have an urgent issue when the clinic is closed that cannot wait until the next business day, you can page your doctor at the number below.    Please note that while we do our best to be available for urgent issues outside of office hours, we are not available 24/7.   If you have an urgent issue and are unable to reach [place], you may choose to seek medical care at your doctor's office, retail clinic, urgent care center, or emergency room.  If you have a medical emergency,  please immediately call 911 or go to the emergency department.  Pager Numbers  - Dr. Nehemiah Massed: [phone]  - Dr. Laurence Ferrari: [phone]  - Dr. Nicole Kindred: [phone]  In the event of inclement weather, please call our main line at [phone] for an update on the status of any delays or closures.  Dermatology Medication Tips: Please keep the boxes that topical medications come in in order to help keep track of the instructions about where and how to use these. Pharmacies typically print the medication instructions only on the boxes and not directly on the medication tubes.   If  your medication is too expensive, please contact our office at [phone] option 4 or send [place] a message through [place].   We are unable to tell what your co-pay for medications will be in advance as this is different depending on your insurance coverage. However, we may be able to find a substitute medication at lower cost or fill out paperwork to get insurance to cover a needed medication.   If a prior authorization is required to get your medication covered by your insurance company, please allow [place] 1-2 business days to complete this process.  Drug prices often vary depending on where the prescription is filled and some pharmacies may offer cheaper prices.  The website www.goodrx.com contains coupons for medications through different pharmacies. The prices here do not account for what the cost may be with help from insurance (it may be cheaper with your insurance), but the website can give you the price if you did not use any insurance.  - You can print the associated coupon and take it with your prescription to the pharmacy.  - You may also stop by our office during regular business hours and pick up a GoodRx coupon card.  - If you need your prescription sent electronically to a different pharmacy, notify our office through [facility] or by phone at [phone] option 4.  Instructions for [facility] Medications  One or more of your medications was sent to the [facility] mail order compounding pharmacy. You will receive an email from them and can purchase the medicine through that link. It will then be mailed to your home at the address you confirmed. If for any reason you do not receive an email from them, please check your spam folder. If you still do not find the email, please let us know. [facility] phone number is [phone].

## 2021-02-12 NOTE — Progress Notes (Signed)
New Patient Visit  Subjective  Judith Fox is a 55 y.o. female who presents for the following: FBSE (Patient here for full body skin exam and skin cancer screening. 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 Patient also with hx of rosacea that she treats with doxycycline 20mg  twice daily and feels like it is doing ok. She is not using any topicals. Patient not aware of any new or changing spots but she does have questions about sclerotherapy. ).  Patient was being followed by [facility] prior to moving to [facility].   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 Currently she is using over the counter Pataday eye drops. She advises her eyes are worse when not taking doxycycline.   The following portions of the chart were reviewed this encounter and updated as appropriate:   Tobacco  Allergies  Meds  Problems  Med Hx  Surg Hx  Fam Hx       Review of Systems:  No other skin or systemic complaints except as noted in HPI or Assessment and Plan.  Objective  Well appearing patient in no apparent distress; mood and affect are within normal limits.  A full examination was performed including scalp, head, eyes, ears, nose, lips, neck, chest, axillae, abdomen, back, buttocks, bilateral upper extremities, bilateral lower extremities, hands, feet, fingers, toes, fingernails, and toenails. All findings within normal limits unless otherwise noted below.  Head - Anterior (Face) Mid face erythema   Assessment & Plan  Rosacea Head - Anterior (Face)  Chronic condition with duration over one year. Condition is bothersome to patient. Not currently at goal.  Patient reports ocular symptoms with improvement but not resolution while taking doxycycline.  Rosacea is a chronic progressive skin condition usually affecting the face of adults, causing redness and/or acne bumps. It is treatable but not curable. It sometimes affects the  eyes (ocular rosacea) as well. It may respond to topical and/or systemic medication and can flare with stress, sun exposure, alcohol, exercise and some foods.  Daily application of broad spectrum spf 30+ sunscreen to face is recommended to reduce flares.  Continue doxycycline 20mg  twice daily with food.   Start [facility] Oxymetazoline: 1%, Ivermectin: 1%, Niacinamide: 2% daily  Doxycycline should be taken with food to prevent nausea. Do not lay down for 30 minutes after taking. Be cautious with sun exposure and use good sun protection while on this medication. Pregnant women should not take this medication.   Recommend taking Heliocare sun protection supplement daily in sunny weather for additional sun protection. For maximum protection on the sunniest days, you can take up to 2 capsules of regular Heliocare OR take 1 capsule of Heliocare Ultra. For prolonged exposure (such as a full day in the sun), you can repeat your dose of the supplement 4 hours after your first dose.     Related Medications doxycycline (PERIOSTAT) 20 MG tablet Take 1 tablet (20 mg total) by mouth 2 (two) times daily. Take with food  Lentigines - Scattered tan macules - Due to sun exposure - Benign-appering, observe - Recommend daily broad spectrum sunscreen SPF 30+ to sun-exposed areas, reapply every 2 hours as needed. - Call for any changes  Seborrheic Keratoses - Stuck-on, waxy, tan-brown papules and/or plaques  - Benign-appearing - Discussed benign etiology and prognosis. - Observe - Call for any changes  Melanocytic Nevi - Tan-brown and/or pink-flesh-colored symmetric macules and papules -  Benign appearing on exam today - Observation - Call clinic for new or changing moles - Recommend daily use of broad spectrum spf 30+ sunscreen to sun-exposed areas.   Hemangiomas - Red papules - Discussed benign nature - Observe - Call for any changes  Actinic Damage - Chronic condition, secondary to  cumulative UV/sun exposure - diffuse scaly erythematous macules with underlying dyspigmentation - Recommend daily broad spectrum sunscreen SPF 30+ to sun-exposed areas, reapply every 2 hours as needed.  - Staying in the shade or wearing long sleeves, sun glasses (UVA+UVB protection) and wide brim hats (4-inch brim around the entire circumference of the hat) are also recommended for sun protection.  - Call for new or changing lesions.  Skin cancer screening performed today.  History of Melanoma in Situ - No evidence of recurrence today - Recommend regular full body skin exams - Recommend daily broad spectrum sunscreen SPF 30+ to sun-exposed areas, reapply every 2 hours as needed.  - Call if any new or changing lesions are noted between office visits  Varicose Veins - Dilated blue, purple or red veins at the lower extremities - Reassured - These can be treated by sclerotherapy (a procedure to inject a medicine into the veins to make them disappear) if desired, but the treatment is not covered by insurance - Recommend patient be evaluated by vein specialist  Return in about 1 year (around 02/12/2022) for TBSE.  Graciella Belton, RMA, am acting as scribe for Forest Gleason, MD .   Documentation: I have reviewed the above documentation for accuracy and completeness, and I agree with the above.  Forest Gleason, MD

## 2021-02-16 ENCOUNTER — Other Ambulatory Visit: Payer: Self-pay | Admitting: *Deleted

## 2021-02-16 MED ORDER — CELECOXIB 200 MG PO CAPS: 200.0000 mg | ORAL_CAPSULE | Freq: Every day | ORAL | 1 refills | Status: DC

## 2021-02-17 ENCOUNTER — Encounter: Payer: Self-pay | Admitting: Podiatry

## 2021-02-17 ENCOUNTER — Ambulatory Visit (INDEPENDENT_AMBULATORY_CARE_PROVIDER_SITE_OTHER): Payer: 59 | Admitting: Podiatry

## 2021-02-17 ENCOUNTER — Encounter: Payer: Self-pay | Admitting: Dermatology

## 2021-02-17 ENCOUNTER — Other Ambulatory Visit: Payer: Self-pay

## 2021-02-17 DIAGNOSIS — B49 Unspecified mycosis: Secondary | ICD-10-CM | POA: Diagnosis not present

## 2021-02-17 DIAGNOSIS — C439 Malignant melanoma of skin, unspecified: Secondary | ICD-10-CM | POA: Insufficient documentation

## 2021-02-17 DIAGNOSIS — L719 Rosacea, unspecified: Secondary | ICD-10-CM | POA: Diagnosis not present

## 2021-02-17 DIAGNOSIS — L209 Atopic dermatitis, unspecified: Secondary | ICD-10-CM | POA: Diagnosis not present

## 2021-02-17 MED ORDER — AZITHROMYCIN 250 MG PO TABS: ORAL_TABLET | ORAL | 0 refills | Status: AC

## 2021-02-17 NOTE — Progress Notes (Signed)
Subjective:  Patient ID: Judith Fox, female    DOB: [birth year],  MRN: [id number]  Chief Complaint  Patient presents with   Nail Problem    PT stated that she is doing much better she stated that she still has some pain and redness on the toe but overall is doing great     55 y.o. female presents with the above complaint.  Patient presents with a follow-up of right hallux infection/epidermolysis with possible atopic dermatitis.  She states she is doing a lot better.  She is no longer has any kind of itching or any complaints.  She denies any other acute complaints.   Review of Systems: Negative except as noted in the HPI. Denies N/V/F/Ch.  Past Medical History:  Diagnosis Date   Allergy    Arthritis    KNEE   Asthma    "mild" per pt   Complication of anesthesia    PT HAD A MI AFTER KNEE SURGERY IN 2014 FROM HIGH BLOOD PRESSURE-SURGERY WAS DONE AT AN OUTPATIENT [facility]   Diabetes mellitus without complication ([place])    Dysrhythmia    sometimes, d/t stress, followed by cardiology   History of hiatal hernia    History of kidney stones    Hypertension    Migraine    Myocardial infarction ([place]) 2014   no stents   Pneumonia    h/o    Current Outpatient Medications:    azithromycin (ZITHROMAX) 250 MG tablet, Take 2 tablets on day 1, then 1 tablet daily on days 2 through 5, Disp: 6 tablet, Rfl: 0   albuterol (VENTOLIN HFA) 108 (90 Base) MCG/ACT inhaler, Inhale 2 puffs into the lungs every 6 (six) hours as needed for wheezing or shortness of breath., Disp: 8 g, Rfl: 2   aspirin EC 81 MG tablet, Take 81 mg by mouth daily., Disp: , Rfl:    atorvastatin (LIPITOR) 40 MG tablet, Take 1 tablet (40 mg total) by mouth daily., Disp: 90 tablet, Rfl: 3   benazepril (LOTENSIN) 40 MG tablet, Take 1 tablet (40 mg total) by mouth daily., Disp: 90 tablet, Rfl: 3   CALCIUM-VITAMIN D PO, Take 2 tablets by mouth daily. , Disp: , Rfl:    celecoxib (CELEBREX) 200 MG capsule, Take 1  capsule (200 mg total) by mouth daily., Disp: 90 capsule, Rfl: 1   doxycycline (PERIOSTAT) 20 MG tablet, Take 1 tablet (20 mg total) by mouth 2 (two) times daily. Take with food, Disp: 180 tablet, Rfl: 1   Dulaglutide (TRULICITY) 8.25 KN/3.9JQ SOPN, INJECT 0.75 MG INTO THE SKIN ONCE A WEEK., Disp: 6 mL, Rfl: 1   fexofenadine (ALLEGRA) 180 MG tablet, Take 180 mg by mouth in the morning., Disp: , Rfl:    fluconazole (DIFLUCAN) 150 MG tablet, Take 150 mg by mouth once a week. And prn, Disp: , Rfl:    glipiZIDE (GLUCOTROL) 10 MG tablet, Take 1 tablet (10 mg total) by mouth daily before breakfast., Disp: 90 tablet, Rfl: 3   levonorgestrel (LILETTA) 19.5 MCG/DAY IUD IUD, by Intrauterine route., Disp: , Rfl:    meclizine (ANTIVERT) 25 MG tablet, Take 1 tablet (25 mg total) by mouth 3 (three) times daily as needed for dizziness., Disp: 30 tablet, Rfl: 2   metFORMIN (GLUCOPHAGE) 1000 MG tablet, TAKE 1 TABLET BY MOUTH 2 TIMES DAILY WITH A MEAL., Disp: 180 tablet, Rfl: 3   methylPREDNISolone (MEDROL DOSEPAK) 4 MG TBPK tablet, Take as directed, Disp: 21 each, Rfl: 0   metoCLOPramide (  REGLAN) 5 MG/ML injection, Inject 10 mg into the vein daily as needed (migraine). , Disp: , Rfl:    Multiple Vitamin (MULTIVITAMIN) capsule, Take 1 capsule by mouth daily., Disp: , Rfl:    naproxen (NAPROSYN) 500 MG tablet, Take 1 tablet (500 mg total) by mouth 2 (two) times daily as needed., Disp: 180 tablet, Rfl: 1   nitroGLYCERIN (NITROSTAT) 0.4 MG SL tablet, PLACE 1 TABLET UNDER THE TONGUE EVERY 5 MINUTES AS NEEDED FOR CHEST PAIN., Disp: 150 tablet, Rfl: 1   omeprazole (PRILOSEC) 20 MG capsule, TAKE 1 CAPSULE BY MOUTH 2 TIMES DAILY BEFORE A MEAL., Disp: 180 capsule, Rfl: 3   ondansetron (ZOFRAN ODT) 4 MG disintegrating tablet, Take 1 tablet (4 mg total) by mouth every 8 (eight) hours as needed for nausea or vomiting., Disp: 20 tablet, Rfl: 0   oxyCODONE (OXY IR/ROXICODONE) 5 MG immediate release tablet, Take 1 tablet (5 mg  total) by mouth every 4 (four) hours as needed for severe pain., Disp: 20 tablet, Rfl: 0   prochlorperazine (COMPAZINE) 10 MG tablet, Take 1 tablet (10 mg total) by mouth every 6 (six) hours as needed for nausea or vomiting (migraine)., Disp: 15 tablet, Rfl: 0   promethazine (PHENERGAN) 25 MG tablet, Take 1 tablet (25 mg total) by mouth every 6 (six) hours as needed for nausea or vomiting., Disp: 35 tablet, Rfl: 0   RESTASIS 0.05 % ophthalmic emulsion, Place 1 drop into both eyes 2 (two) times daily. , Disp: , Rfl:    terbinafine (LAMISIL) 250 MG tablet, Take 1 tablet (250 mg total) by mouth daily., Disp: 5 tablet, Rfl: 0   traMADol (ULTRAM) 50 MG tablet, Take 50 mg by mouth every 6 (six) hours as needed., Disp: , Rfl:    triamcinolone cream (KENALOG) 0.1 %, Apply topically., Disp: , Rfl:   Social History   Tobacco Use  Smoking Status Never  Smokeless Tobacco Never    Allergies  Allergen Reactions   Latex Rash and Swelling    Eye swelling    Butorphanol Other (See Comments)    drowsiness Somnolence Pt "knocked out for 12 hrs" when taking this    Lactose Intolerance (Gi) Other (See Comments)    Reflux and Indigestion   Objective:  There were no vitals filed for this visit. There is no height or weight on file to calculate BMI. Constitutional Well developed. Well nourished.  Vascular Dorsalis pedis pulses palpable bilaterally. Posterior tibial pulses palpable bilaterally. Capillary refill normal to all digits.  No cyanosis or clubbing noted. Pedal hair growth normal.  Neurologic Normal speech. Oriented to person, place, and time. Epicritic sensation to light touch grossly present bilaterally.  Dermatologic Right hallux nail within normal limits.  The nail is attached to the nailbed.  No further blister formation noted to the right hallux without epidermolysis/redness.  Clinically resolved.  Orthopedic: Normal joint ROM without pain or crepitus bilaterally. No visible  deformities. No bony tenderness.   Radiographs: None Assessment:   1. Rosacea   2. Atopic dermatitis, unspecified type   3. Fungal infection      Plan:  Patient was evaluated and treated and all questions answered.  Right hallux dermatitis versus bacterial infection versus fungal fungal infection -Clinically resolved.  No further infection noted.  Patient can return to normal activities without any reservation.  If any foot and ankle issues arise in future she will come and see me.   No follow-ups on file.

## 2021-03-25 ENCOUNTER — Other Ambulatory Visit
Admission: RE | Admit: 2021-03-25 | Discharge: 2021-03-25 | Disposition: A | Discharge status: Home / Self Care | Payer: 59 | Source: Ambulatory Visit | Attending: Cardiology | Admitting: Cardiology

## 2021-03-25 DIAGNOSIS — I10 Essential (primary) hypertension: Secondary | ICD-10-CM | POA: Diagnosis not present

## 2021-03-25 DIAGNOSIS — R079 Chest pain, unspecified: Secondary | ICD-10-CM | POA: Insufficient documentation

## 2021-03-25 LAB — TROPONIN I (HIGH SENSITIVITY): Troponin I (High Sensitivity): 3 ng/L (ref <18)

## 2021-03-29 ENCOUNTER — Emergency Department: Payer: 59

## 2021-03-29 ENCOUNTER — Other Ambulatory Visit: Payer: Self-pay

## 2021-03-29 ENCOUNTER — Emergency Department
Admission: EM | Admit: 2021-03-29 | Discharge: 2021-03-29 | Disposition: A | Discharge status: Home / Self Care | Payer: 59 | Attending: Emergency Medicine | Admitting: Emergency Medicine

## 2021-03-29 DIAGNOSIS — R079 Chest pain, unspecified: Secondary | ICD-10-CM | POA: Diagnosis present

## 2021-03-29 DIAGNOSIS — Z5321 Procedure and treatment not carried out due to patient leaving prior to being seen by health care provider: Secondary | ICD-10-CM | POA: Diagnosis not present

## 2021-03-29 DIAGNOSIS — R009 Unspecified abnormalities of heart beat: Secondary | ICD-10-CM | POA: Diagnosis not present

## 2021-03-29 LAB — BASIC METABOLIC PANEL
Anion gap: 8 (ref 5–15)
BUN: 20 mg/dL (ref 6–20)
CO2: 26 mmol/L (ref 22–32)
Calcium: 9.7 mg/dL (ref 8.9–10.3)
Chloride: 102 mmol/L (ref 98–111)
Creatinine, Ser: 0.88 mg/dL (ref 0.44–1.00)
GFR, Estimated: >60 mL/min (ref >60)
Glucose, Bld: 136 mg/dL — ABNORMAL HIGH (ref 70–99)
Potassium: 4.7 mmol/L (ref 3.5–5.1)
Sodium: 136 mmol/L (ref 135–145)

## 2021-03-29 LAB — CBC
HCT: 38.5 % (ref 36.0–46.0)
Hemoglobin: 12.6 g/dL (ref 12.0–15.0)
MCH: 26.7 pg (ref 26.0–34.0)
MCHC: 32.7 g/dL (ref 30.0–36.0)
MCV: 81.6 fL (ref 80.0–100.0)
Platelets: 229 10*3/uL (ref 150–400)
RBC: 4.72 MIL/uL (ref 3.87–5.11)
RDW: 15.6 % — ABNORMAL HIGH (ref 11.5–15.5)
WBC: 7.4 10*3/uL (ref 4.0–10.5)
nRBC: 0 % (ref 0.0–0.2)

## 2021-03-29 LAB — TROPONIN I (HIGH SENSITIVITY): Troponin I (High Sensitivity): 3 ng/L (ref <18)

## 2021-03-29 IMAGING — CR DG CHEST 2V
1 series · 2 of 2 positions shown · non-contrast
Comparison: [DATE]

CLINICAL DATA: Pt states she went to see Dr NIALLY for CP earlier
this week- pt states she has a hx of MI 7-8 years ago- pt states the
CP has gotten worse since seeing cardiology- pt states that she
feels fatigued, n/v, and dizzy- pt states she has an irregular heart
beat sometimes

EXAM:
CHEST - 2 VIEW

[Series 1: dg chest 2 view · 0.14mm/px · 2 of 2 slices shown]
[im 1/2]
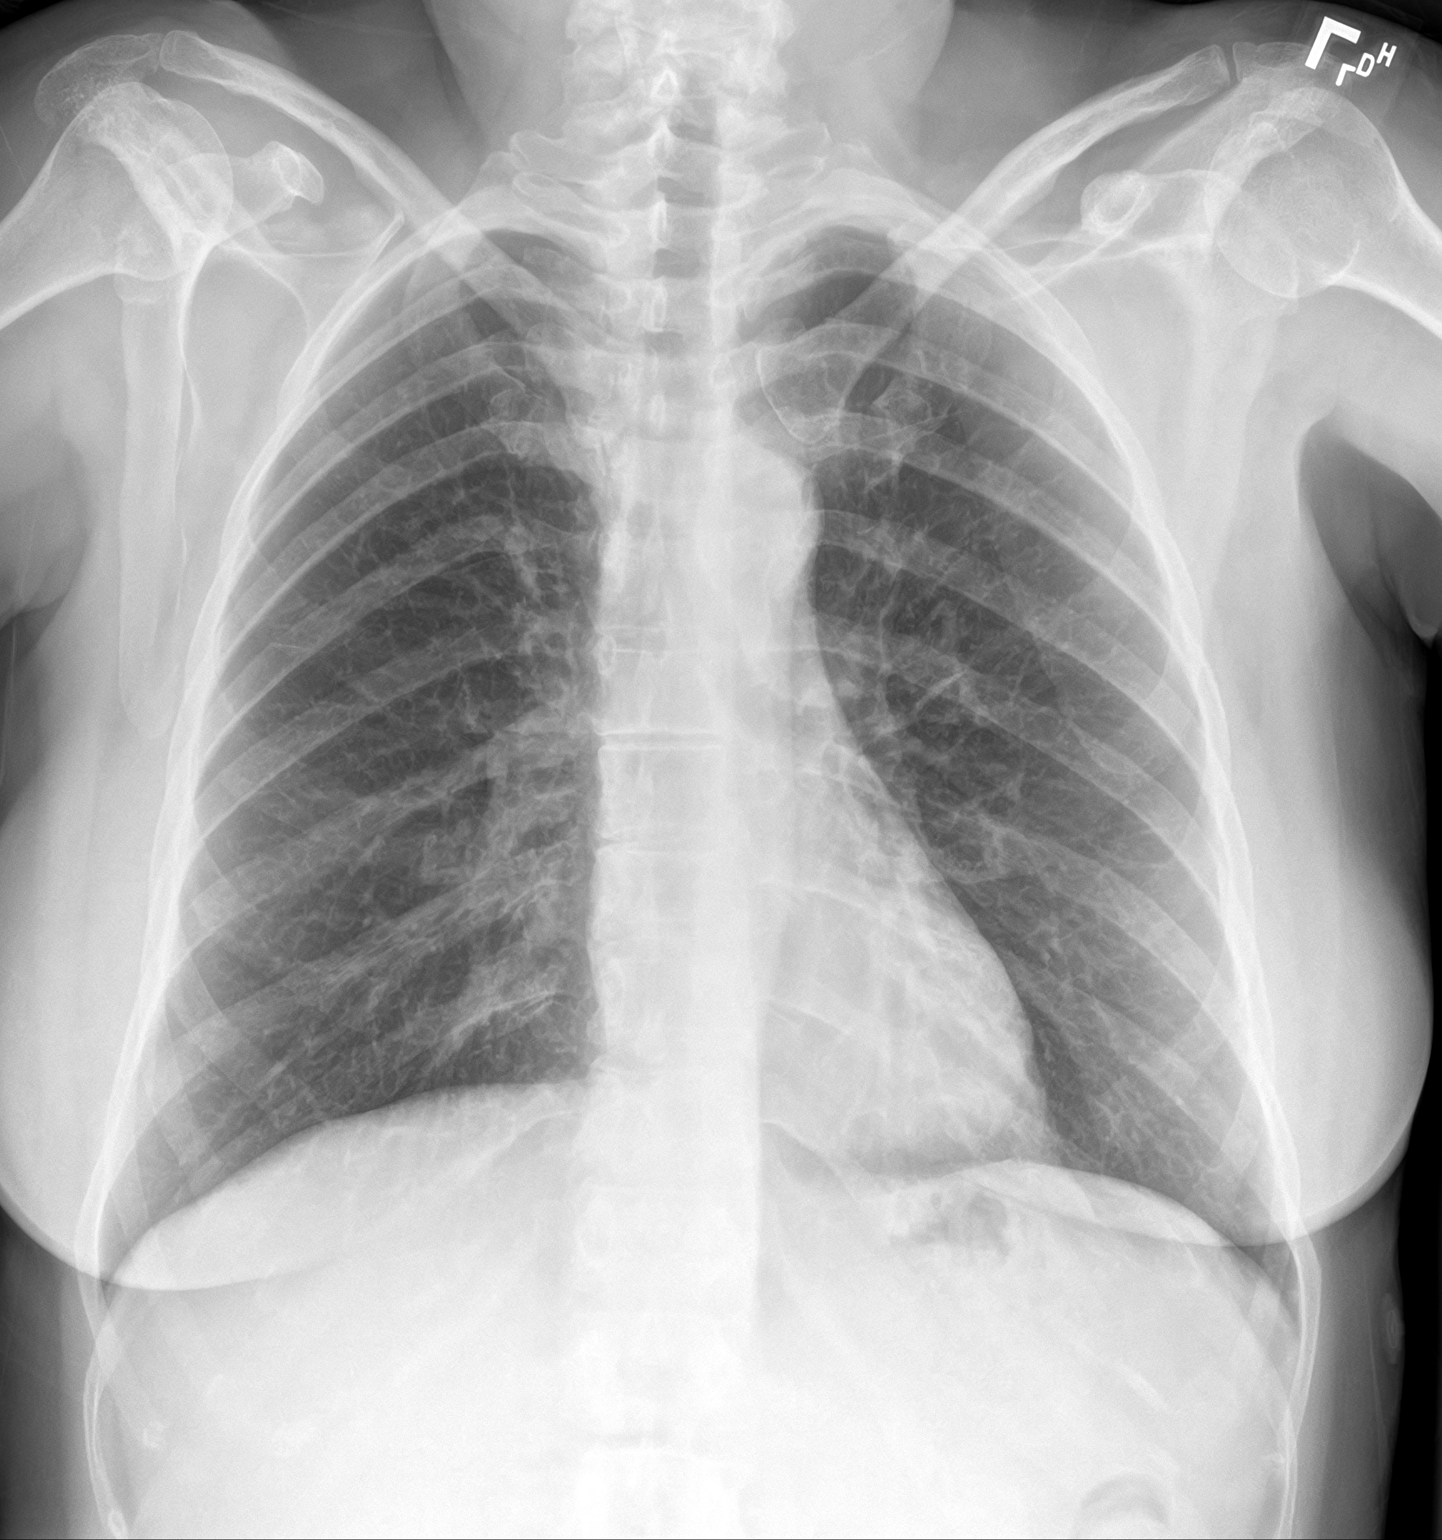
[im 2/2]
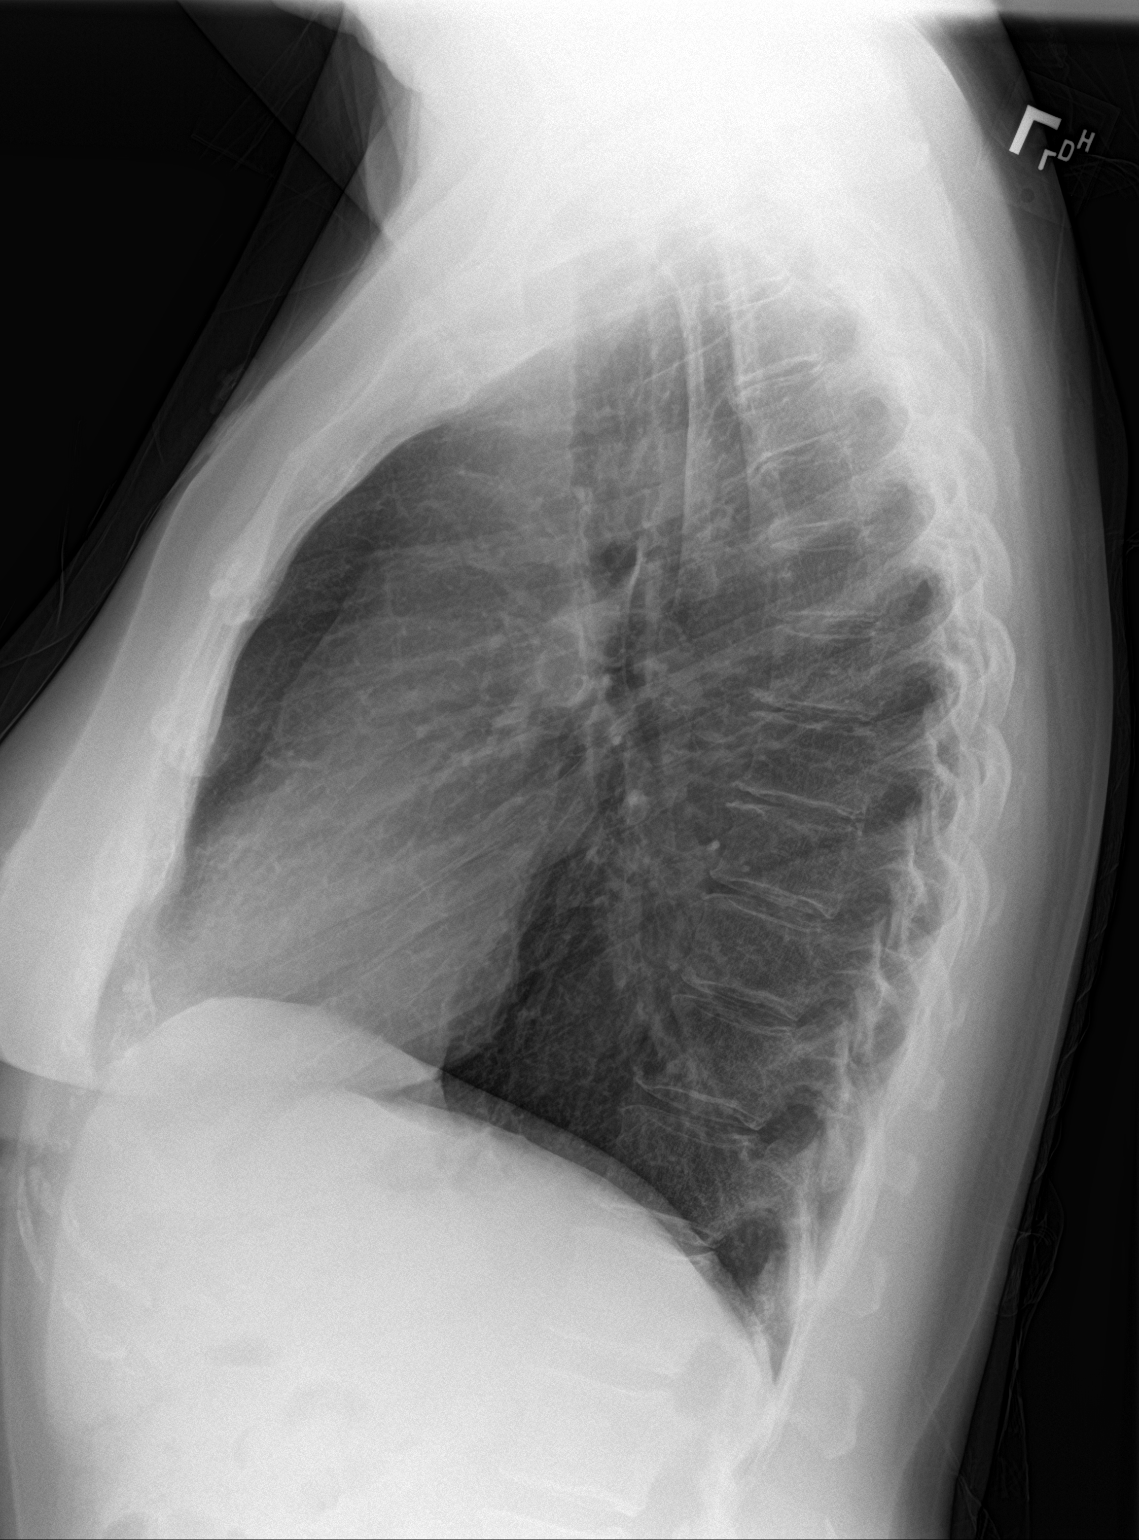

[2 of 2 positions shown; findings below may reference images not displayed]

FINDINGS: Normal heart, mediastinum and hila.

Clear lungs.  No pleural effusion or pneumothorax.

Skeletal structures are intact.
IMPRESSION: No active cardiopulmonary disease.

## 2021-03-29 NOTE — ED Triage Notes (Signed)
Pt states she went to see Dr Ubaldo Glassing for CP earlier this week- pt states she has a hx of MI 7-8 years ago- pt states the CP has gotten worse since seeing cardiology- pt states that she feels fatigued, n/v, and dizzy- pt states she has an irregular heart beat sometimes

## 2021-04-02 ENCOUNTER — Other Ambulatory Visit: Payer: Self-pay | Admitting: Family Medicine

## 2021-04-02 DIAGNOSIS — I519 Heart disease, unspecified: Secondary | ICD-10-CM

## 2021-04-28 ENCOUNTER — Other Ambulatory Visit: Payer: Self-pay | Admitting: Family Medicine

## 2021-04-28 DIAGNOSIS — I519 Heart disease, unspecified: Secondary | ICD-10-CM

## 2021-04-29 ENCOUNTER — Telehealth: Payer: Self-pay | Admitting: Gastroenterology

## 2021-05-04 ENCOUNTER — Encounter: Payer: Self-pay | Admitting: Family Medicine

## 2021-05-04 ENCOUNTER — Ambulatory Visit (INDEPENDENT_AMBULATORY_CARE_PROVIDER_SITE_OTHER): Payer: 59 | Admitting: Family Medicine

## 2021-05-04 ENCOUNTER — Other Ambulatory Visit: Payer: Self-pay

## 2021-05-04 VITALS — BP 133/86 | HR 85 | Resp 16 | Ht 69.0 in | Wt 183.0 lb

## 2021-05-04 DIAGNOSIS — Z23 Encounter for immunization: Secondary | ICD-10-CM | POA: Diagnosis not present

## 2021-05-04 DIAGNOSIS — K219 Gastro-esophageal reflux disease without esophagitis: Secondary | ICD-10-CM

## 2021-05-04 DIAGNOSIS — I10 Essential (primary) hypertension: Secondary | ICD-10-CM

## 2021-05-04 DIAGNOSIS — E119 Type 2 diabetes mellitus without complications: Secondary | ICD-10-CM

## 2021-05-04 DIAGNOSIS — E782 Mixed hyperlipidemia: Secondary | ICD-10-CM | POA: Diagnosis not present

## 2021-05-04 MED ORDER — SUCRALFATE 1 G PO TABS: 1.0000 g | ORAL_TABLET | Freq: Three times a day (TID) | ORAL | 11 refills | Status: DC

## 2021-05-04 NOTE — Progress Notes (Signed)
I,April Miller,acting as a scribe for Wilhemena Durie, MD.,have documented all relevant documentation on the behalf of Wilhemena Durie, MD,as directed by  Wilhemena Durie, MD while in the presence of Wilhemena Durie, MD.   Established patient visit   Patient: Judith Fox   DOB: 1965-09-27   55 y.o. Female  MRN: [id number] Visit Date: 05/04/2021  Today's healthcare provider: Wilhemena Durie, MD   Chief Complaint  Patient presents with   Follow-up   Diabetes   Hypertension   Hyperlipidemia   Subjective    HPI  Patient has recently had a negative work-up with Dr. Satira Mccallum for palpitations and cardiac issues. She is having some GI distress and is waiting on a GI referral.  Mainly reflux symptoms.  She is on omeprazole twice a day presently. [place] and her husband are doing a lot of traveling and she is enjoying that. Diabetes Mellitus Type II, follow-up  Lab Results  Component Value Date   HGBA1C 6.6 (A) 12/31/2020   HGBA1C 7.9 (A) 08/12/2020   HGBA1C 7.5 (A) 04/17/2020   Last seen for diabetes 4 months ago.  Management since then includes; A1c is 6.6 under good control on metformin maximum dose with glipizide 10 most recently Trulicity. She reports good compliance with treatment. She is not having side effects. none  Home blood sugar records: fasting range: not checking  Episodes of hypoglycemia? No none   Current insulin regiment: Trulicity Most Recent Eye Exam: 07/01/2020  --------------------------------------------------------------------------------------------------- Hypertension, follow-up  BP Readings from Last 3 Encounters:  05/04/21 133/86  03/29/21 (!) 171/99  12/31/20 133/82   Wt Readings from Last 3 Encounters:  05/04/21 183 lb (83 kg)  03/29/21 184 lb (83.5 kg)  12/31/20 189 lb 9.6 oz (86 kg)     She was last seen for hypertension 4 months ago.  BP at that visit was 133/82. Management since that visit includes; Good  control on benazepril. She reports good compliance with treatment. She is not having side effects. none She is exercising. She is not adherent to low salt diet.   Outside blood pressures are not checking.  She does not smoke.  Use of agents associated with hypertension: none.   --------------------------------------------------------------------------------------------------- Lipid/Cholesterol, follow-up  Last Lipid Panel: Lab Results  Component Value Date   CHOL 96 (L) 07/08/2020   LDLCALC 32 07/08/2020   HDL 51 07/08/2020   TRIG 56 07/08/2020    She was last seen for this 4 months ago.  Management since that visit includes; labs ordered but zero were done.  She reports good compliance with treatment. She is not having side effects. none  She is following a Regular diet. Current exercise: walking  Last metabolic panel Lab Results  Component Value Date   GLUCOSE 136 (H) 03/29/2021   NA 136 03/29/2021   K 4.7 03/29/2021   BUN 20 03/29/2021   CREATININE 0.88 03/29/2021   GFRNONAA >60 03/29/2021   GFRAA >60 01/24/2020   CALCIUM 9.7 03/29/2021   AST 24 01/24/2020   ALT 20 01/24/2020   The ASCVD Risk score (Arnett DK, et al., 2019) failed to calculate for the following reasons:   The patient has a prior MI or stroke diagnosis  ---------------------------------------------------------------------------------------------------     Medications: Outpatient Medications Prior to Visit  Medication Sig   albuterol (VENTOLIN HFA) 108 (90 Base) MCG/ACT inhaler Inhale 2 puffs into the lungs every 6 (six) hours as needed for wheezing or shortness of breath.  aspirin EC 81 MG tablet Take 81 mg by mouth daily.   atorvastatin (LIPITOR) 40 MG tablet Take 1 tablet (40 mg total) by mouth daily.   benazepril (LOTENSIN) 40 MG tablet Take 1 tablet (40 mg total) by mouth daily.   CALCIUM-VITAMIN D PO Take 2 tablets by mouth daily.    celecoxib (CELEBREX) 200 MG capsule Take 1  capsule (200 mg total) by mouth daily.   doxycycline (PERIOSTAT) 20 MG tablet Take 1 tablet (20 mg total) by mouth 2 (two) times daily. Take with food   Dulaglutide (TRULICITY) 6.01 UX/3.2TF SOPN INJECT 0.75 MG INTO THE SKIN ONCE A WEEK.   fexofenadine (ALLEGRA) 180 MG tablet Take 180 mg by mouth in the morning.   fluconazole (DIFLUCAN) 150 MG tablet Take 150 mg by mouth once a week. And prn   glipiZIDE (GLUCOTROL) 10 MG tablet Take 1 tablet (10 mg total) by mouth daily before breakfast.   levonorgestrel (LILETTA) 19.5 MCG/DAY IUD IUD by Intrauterine route.   meclizine (ANTIVERT) 25 MG tablet Take 1 tablet (25 mg total) by mouth 3 (three) times daily as needed for dizziness.   metFORMIN (GLUCOPHAGE) 1000 MG tablet TAKE 1 TABLET BY MOUTH 2 TIMES DAILY WITH A MEAL.   methylPREDNISolone (MEDROL DOSEPAK) 4 MG TBPK tablet Take as directed   metoCLOPramide (REGLAN) 5 MG/ML injection Inject 10 mg into the vein daily as needed (migraine).    Multiple Vitamin (MULTIVITAMIN) capsule Take 1 capsule by mouth daily.   naproxen (NAPROSYN) 500 MG tablet Take 1 tablet (500 mg total) by mouth 2 (two) times daily as needed.   nitroGLYCERIN (NITROSTAT) 0.4 MG SL tablet PLACE 1 TABLET UNDER THE TONGUE EVERY 5 MINUTES AS NEEDED FOR CHEST PAIN.   omeprazole (PRILOSEC) 20 MG capsule TAKE 1 CAPSULE BY MOUTH 2 TIMES DAILY BEFORE A MEAL.   ondansetron (ZOFRAN ODT) 4 MG disintegrating tablet Take 1 tablet (4 mg total) by mouth every 8 (eight) hours as needed for nausea or vomiting.   oxyCODONE (OXY IR/ROXICODONE) 5 MG immediate release tablet Take 1 tablet (5 mg total) by mouth every 4 (four) hours as needed for severe pain.   prochlorperazine (COMPAZINE) 10 MG tablet Take 1 tablet (10 mg total) by mouth every 6 (six) hours as needed for nausea or vomiting (migraine).   promethazine (PHENERGAN) 25 MG tablet Take 1 tablet (25 mg total) by mouth every 6 (six) hours as needed for nausea or vomiting.   RESTASIS 0.05 %  ophthalmic emulsion Place 1 drop into both eyes 2 (two) times daily.    traMADol (ULTRAM) 50 MG tablet Take 50 mg by mouth every 6 (six) hours as needed.   No facility-administered medications prior to visit.    Review of Systems  Constitutional:  Negative for appetite change, chills, fatigue and fever.  Respiratory:  Negative for chest tightness and shortness of breath.   Cardiovascular:  Negative for chest pain and palpitations.  Gastrointestinal:  Negative for abdominal pain, nausea and vomiting.  Neurological:  Negative for dizziness and weakness.   Last hemoglobin A1c Lab Results  Component Value Date   HGBA1C 7.3 (H) 05/04/2021       Objective    BP 133/86 (BP Location: Left Arm, Patient Position: Sitting, Cuff Size: Large)   Pulse 85   Resp 16   Ht 5\' 9"  (1.753 m)   Wt 183 lb (83 kg)   SpO2 97%   BMI 27.02 kg/m  BP Readings from Last 3 Encounters:  05/04/21  133/86  03/29/21 (!) 171/99  12/31/20 133/82   Wt Readings from Last 3 Encounters:  05/04/21 183 lb (83 kg)  03/29/21 184 lb (83.5 kg)  12/31/20 189 lb 9.6 oz (86 kg)      Physical Exam Vitals reviewed.  Constitutional:      Appearance: Normal appearance. She is well-developed and normal weight.  HENT:     Head: Normocephalic.     Right Ear: External ear normal.     Left Ear: External ear normal.     Nose: Nose normal.     Mouth/Throat:     Pharynx: No oropharyngeal exudate.  Eyes:     Conjunctiva/sclera: Conjunctivae normal.     Pupils: Pupils are equal, round, and reactive to light.  Cardiovascular:     Rate and Rhythm: Normal rate and regular rhythm.     Heart sounds: Normal heart sounds.  Pulmonary:     Effort: Pulmonary effort is normal. No respiratory distress.     Breath sounds: Normal breath sounds.  Abdominal:     Palpations: Abdomen is soft.  Musculoskeletal:        General: No tenderness.     Cervical back: Normal range of motion and neck supple.  Lymphadenopathy:     Cervical:  No cervical adenopathy.  Skin:    General: Skin is warm and dry.  Neurological:     General: No focal deficit present.     Mental Status: She is alert and oriented to person, place, and time. Mental status is at baseline.  Psychiatric:        Mood and Affect: Mood normal.        Behavior: Behavior normal.        Thought Content: Thought content normal.        Judgment: Judgment normal.      No results found for any visits on 05/04/21.  Assessment & Plan     1. Type 2 diabetes mellitus without complication, without long-term current use of insulin (HCC) On Trulicity and glipizide and metformin. - Hemoglobin A1c - Lipid panel - TSH - CBC w/Diff/Platelet - Comprehensive Metabolic Panel (CMET)  2. Essential hypertension Controlled on benazepril - Hemoglobin A1c - Lipid panel - TSH - CBC w/Diff/Platelet - Comprehensive Metabolic Panel (CMET)  3. Mixed hyperlipidemia On atorvastatin 40 - Hemoglobin A1c - Lipid panel - TSH - CBC w/Diff/Platelet - Comprehensive Metabolic Panel (CMET)  4. Need for influenza vaccination  - Flu Vaccine QUAD 35mo+IM (Fluarix, Fluzone & Alfiuria Quad PF)  5. Gastroesophageal reflux disease, unspecified whether esophagitis present Start sucralfate before each meal and at bedtime pending GI referral. - sucralfate (CARAFATE) 1 g tablet; Take 1 tablet (1 g total) by mouth 4 (four) times daily -  before meals and at bedtime.  Dispense: 120 tablet; Refill: 11   Return in about 4 months (around 09/03/2021).      I, Wilhemena Durie, MD, have reviewed all documentation for this visit. The documentation on 05/07/21 for the exam, diagnosis, procedures, and orders are all accurate and complete.    Lyanna Blystone Cranford Mon, MD  [facility] [phone] (phone) [phone] (fax)  [place]

## 2021-05-05 LAB — CBC WITH DIFFERENTIAL/PLATELET
Basophils Absolute: 0 10*3/uL (ref 0.0–0.2)
Basos: 1 %
EOS (ABSOLUTE): 0.1 10*3/uL (ref 0.0–0.4)
Eos: 2 %
Hematocrit: 37.6 % (ref 34.0–46.6)
Hemoglobin: 11.8 g/dL (ref 11.1–15.9)
Immature Grans (Abs): 0 10*3/uL (ref 0.0–0.1)
Immature Granulocytes: 0 %
Lymphocytes Absolute: 1.9 10*3/uL (ref 0.7–3.1)
Lymphs: 31 %
MCH: 25.4 pg — ABNORMAL LOW (ref 26.6–33.0)
MCHC: 31.4 g/dL — ABNORMAL LOW (ref 31.5–35.7)
MCV: 81 fL (ref 79–97)
Monocytes Absolute: 0.5 10*3/uL (ref 0.1–0.9)
Monocytes: 7 %
Neutrophils Absolute: 3.7 10*3/uL (ref 1.4–7.0)
Neutrophils: 59 %
Platelets: 256 10*3/uL (ref 150–450)
RBC: 4.64 x10E6/uL (ref 3.77–5.28)
RDW: 14.7 % (ref 11.7–15.4)
WBC: 6.2 10*3/uL (ref 3.4–10.8)

## 2021-05-05 LAB — TSH: TSH: 1.32 u[IU]/mL (ref 0.450–4.500)

## 2021-05-05 LAB — COMPREHENSIVE METABOLIC PANEL
ALT: 15 IU/L (ref 0–32)
AST: 25 IU/L (ref 0–40)
Albumin/Globulin Ratio: 2.1 (ref 1.2–2.2)
Albumin: 4.5 g/dL (ref 3.8–4.9)
Alkaline Phosphatase: 88 IU/L (ref 44–121)
BUN/Creatinine Ratio: 16 (ref 9–23)
BUN: 15 mg/dL (ref 6–24)
Bilirubin Total: 0.5 mg/dL (ref 0.0–1.2)
CO2: 24 mmol/L (ref 20–29)
Calcium: 10 mg/dL (ref 8.7–10.2)
Chloride: 104 mmol/L (ref 96–106)
Creatinine, Ser: 0.94 mg/dL (ref 0.57–1.00)
Globulin, Total: 2.1 g/dL (ref 1.5–4.5)
Glucose: 127 mg/dL — ABNORMAL HIGH (ref 70–99)
Potassium: 5 mmol/L (ref 3.5–5.2)
Sodium: 142 mmol/L (ref 134–144)
Total Protein: 6.6 g/dL (ref 6.0–8.5)
eGFR: 72 mL/min/{1.73_m2} (ref >59)

## 2021-05-05 LAB — LIPID PANEL
Chol/HDL Ratio: 1.9 ratio (ref 0.0–4.4)
Cholesterol, Total: 98 mg/dL — ABNORMAL LOW (ref 100–199)
HDL: 52 mg/dL (ref >39)
LDL Chol Calc (NIH): 35 mg/dL (ref 0–99)
Triglycerides: 42 mg/dL (ref 0–149)
VLDL Cholesterol Cal: 11 mg/dL (ref 5–40)

## 2021-05-05 LAB — HEMOGLOBIN A1C
Est. average glucose Bld gHb Est-mCnc: 163 mg/dL
Hgb A1c MFr Bld: 7.3 % — ABNORMAL HIGH (ref 4.8–5.6)

## 2021-05-06 ENCOUNTER — Other Ambulatory Visit (HOSPITAL_COMMUNITY): Payer: Self-pay | Admitting: Gastroenterology

## 2021-05-06 ENCOUNTER — Other Ambulatory Visit: Payer: Self-pay | Admitting: Gastroenterology

## 2021-05-06 ENCOUNTER — Telehealth: Payer: Self-pay

## 2021-05-06 DIAGNOSIS — R1011 Right upper quadrant pain: Secondary | ICD-10-CM

## 2021-05-06 NOTE — Telephone Encounter (Signed)
Patient left a voicemail stating that Dr. Kyra Searles was going to send over a referral to see Dr. Vicente Males in office. Called patient and informed patient that we have not receive the referral but to call there office to get them to resend the referral and then we could see her in the office

## 2021-05-11 ENCOUNTER — Encounter: Payer: Self-pay | Admitting: *Deleted

## 2021-05-12 ENCOUNTER — Ambulatory Visit: Payer: 59 | Admitting: Certified Registered Nurse Anesthetist

## 2021-05-12 ENCOUNTER — Ambulatory Visit
Admission: RE | Admit: 2021-05-12 | Discharge: 2021-05-12 | Disposition: A | Discharge status: Home / Self Care | Payer: 59 | Attending: Gastroenterology | Admitting: Gastroenterology

## 2021-05-12 ENCOUNTER — Encounter: Payer: Self-pay | Admitting: *Deleted

## 2021-05-12 ENCOUNTER — Encounter
Admission: RE | Disposition: A | Discharge status: Home / Self Care | Payer: Self-pay | Source: Home / Self Care | Attending: Gastroenterology

## 2021-05-12 ENCOUNTER — Other Ambulatory Visit: Payer: Self-pay

## 2021-05-12 DIAGNOSIS — R1013 Epigastric pain: Secondary | ICD-10-CM | POA: Diagnosis present

## 2021-05-12 DIAGNOSIS — Z7982 Long term (current) use of aspirin: Secondary | ICD-10-CM | POA: Insufficient documentation

## 2021-05-12 DIAGNOSIS — K449 Diaphragmatic hernia without obstruction or gangrene: Secondary | ICD-10-CM | POA: Diagnosis not present

## 2021-05-12 DIAGNOSIS — K317 Polyp of stomach and duodenum: Secondary | ICD-10-CM | POA: Diagnosis not present

## 2021-05-12 DIAGNOSIS — E669 Obesity, unspecified: Secondary | ICD-10-CM | POA: Insufficient documentation

## 2021-05-12 DIAGNOSIS — Z793 Long term (current) use of hormonal contraceptives: Secondary | ICD-10-CM | POA: Insufficient documentation

## 2021-05-12 DIAGNOSIS — Z7984 Long term (current) use of oral hypoglycemic drugs: Secondary | ICD-10-CM | POA: Diagnosis not present

## 2021-05-12 DIAGNOSIS — Z975 Presence of (intrauterine) contraceptive device: Secondary | ICD-10-CM | POA: Diagnosis not present

## 2021-05-12 DIAGNOSIS — I252 Old myocardial infarction: Secondary | ICD-10-CM | POA: Insufficient documentation

## 2021-05-12 DIAGNOSIS — Z791 Long term (current) use of non-steroidal anti-inflammatories (NSAID): Secondary | ICD-10-CM | POA: Insufficient documentation

## 2021-05-12 DIAGNOSIS — I10 Essential (primary) hypertension: Secondary | ICD-10-CM | POA: Insufficient documentation

## 2021-05-12 DIAGNOSIS — Z888 Allergy status to other drugs, medicaments and biological substances status: Secondary | ICD-10-CM | POA: Diagnosis not present

## 2021-05-12 DIAGNOSIS — J45909 Unspecified asthma, uncomplicated: Secondary | ICD-10-CM | POA: Diagnosis not present

## 2021-05-12 DIAGNOSIS — K2950 Unspecified chronic gastritis without bleeding: Secondary | ICD-10-CM | POA: Insufficient documentation

## 2021-05-12 DIAGNOSIS — Z87442 Personal history of urinary calculi: Secondary | ICD-10-CM | POA: Insufficient documentation

## 2021-05-12 DIAGNOSIS — E119 Type 2 diabetes mellitus without complications: Secondary | ICD-10-CM | POA: Insufficient documentation

## 2021-05-12 DIAGNOSIS — E785 Hyperlipidemia, unspecified: Secondary | ICD-10-CM | POA: Insufficient documentation

## 2021-05-12 DIAGNOSIS — Z7985 Long-term (current) use of injectable non-insulin antidiabetic drugs: Secondary | ICD-10-CM | POA: Insufficient documentation

## 2021-05-12 DIAGNOSIS — Z91011 Allergy to milk products: Secondary | ICD-10-CM | POA: Insufficient documentation

## 2021-05-12 DIAGNOSIS — Z91040 Latex allergy status: Secondary | ICD-10-CM | POA: Insufficient documentation

## 2021-05-12 HISTORY — DX: Simple chronic bronchitis: J41.0

## 2021-05-12 HISTORY — DX: Palpitations: R00.2

## 2021-05-12 HISTORY — DX: Varicose veins of bilateral lower extremities with pain: I83.813

## 2021-05-12 HISTORY — DX: Anxiety disorder, unspecified: F41.9

## 2021-05-12 HISTORY — PX: ESOPHAGOGASTRODUODENOSCOPY (EGD) WITH PROPOFOL: SHX5813

## 2021-05-12 HISTORY — DX: Hypomagnesemia: E83.42

## 2021-05-12 LAB — GLUCOSE, CAPILLARY: Glucose-Capillary: 137 mg/dL — ABNORMAL HIGH (ref 70–99)

## 2021-05-12 SURGERY — ESOPHAGOGASTRODUODENOSCOPY (EGD) WITH PROPOFOL
Anesthesia: General

## 2021-05-12 MED ORDER — PROPOFOL 10 MG/ML IV BOLUS
INTRAVENOUS | Status: DC | PRN
Start: 2021-05-12 — End: 2021-05-12
  Administered 2021-05-12: 60 mg via INTRAVENOUS
  Administered 2021-05-12 (×2): 20 mg via INTRAVENOUS

## 2021-05-12 MED ORDER — LIDOCAINE HCL (CARDIAC) PF 100 MG/5ML IV SOSY
PREFILLED_SYRINGE | INTRAVENOUS | Status: DC | PRN
  Administered 2021-05-12: 100 mg via INTRAVENOUS

## 2021-05-12 MED ORDER — PROPOFOL 500 MG/50ML IV EMUL
INTRAVENOUS | Status: DC | PRN
  Administered 2021-05-12: 150 ug/kg/min via INTRAVENOUS

## 2021-05-12 MED ORDER — SODIUM CHLORIDE 0.9 % IV SOLN: INTRAVENOUS | Status: DC

## 2021-05-12 NOTE — Anesthesia Preprocedure Evaluation (Signed)
Anesthesia Evaluation  Patient identified by MRN, date of birth, ID band Patient awake    Reviewed: Allergy & Precautions, NPO status , Patient's Chart, lab work & pertinent test results  History of Anesthesia Complications Negative for: history of anesthetic complications  Airway Mallampati: II  TM Distance: >3 FB Neck ROM: Full    Dental   Pulmonary asthma (URI related) , neg sleep apnea,    breath sounds clear to auscultation- rhonchi (-) wheezing      Cardiovascular Exercise Tolerance: Good hypertension, Pt. on medications + Past MI (during joint replacement, unknown cause)  (-) CAD, (-) Cardiac Stents and (-) CABG  Rhythm:Regular Rate:Normal - Systolic murmurs and - Diastolic murmurs    Neuro/Psych  Headaches, neg Seizures Anxiety    GI/Hepatic Neg liver ROS, hiatal hernia,   Endo/Other  diabetes, Oral Hypoglycemic Agents  Renal/GU negative Renal ROS     Musculoskeletal  (+) Arthritis ,   Abdominal (+) - obese,   Peds  Hematology negative hematology ROS (+)   Anesthesia Other Findings Past Medical History: No date: Allergy No date: Anxiety No date: Arthritis     Comment:  KNEE No date: Asthma     Comment:  "mild" per pt No date: Complication of anesthesia     Comment:  PT HAD A MI AFTER KNEE SURGERY IN 2014 FROM HIGH BLOOD               PRESSURE-SURGERY WAS DONE AT AN [place] No date: Diabetes mellitus without complication (HCC) No date: Dysrhythmia     Comment:  sometimes, d/t stress, followed by cardiology No date: History of hiatal hernia No date: History of kidney stones No date: Hypertension No date: Hypomagnesemia No date: Melanoma in Situ     Comment:  >28 years ago No date: Migraine 2014: Myocardial infarction ([place])     Comment:  no stents No date: Palpitation No date: Pneumonia     Comment:  h/o No date: 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: Syncope and  collapse No date: Varicose veins of both lower extremities with pain   Reproductive/Obstetrics                             Anesthesia Physical Anesthesia Plan  ASA: 3  Anesthesia Plan: General   Post-op Pain Management:    Induction: Intravenous  PONV Risk Score and Plan: 2 and Propofol infusion  Airway Management Planned: Natural Airway  Additional Equipment:   Intra-op Plan:   Post-operative Plan:   Informed Consent: I have reviewed the patients History and Physical, chart, labs and discussed the procedure including the risks, benefits and alternatives for the proposed anesthesia with the patient or authorized representative who has indicated his/her understanding and acceptance.     Dental advisory given  Plan Discussed with: CRNA and Anesthesiologist  Anesthesia Plan Comments:         Anesthesia Quick Evaluation

## 2021-05-12 NOTE — Transfer of Care (Signed)
Immediate Anesthesia Transfer of Care Note  Patient: Judith Fox  Procedure(s) Performed: ESOPHAGOGASTRODUODENOSCOPY (EGD) WITH PROPOFOL  Patient Location: Endoscopy Unit  Anesthesia Type:General  Level of Consciousness: awake, alert  and oriented  Airway & Oxygen Therapy: Patient Spontanous Breathing  Post-op Assessment: Report given to RN and Post -op Vital signs reviewed and stable  Post vital signs: Reviewed and stable  Last Vitals:  Vitals Value Taken Time  BP 123/80 05/12/21 0941  Temp    Pulse 92 05/12/21 0943  Resp 13 05/12/21 0943  SpO2 99 % 05/12/21 0943  Vitals shown include unvalidated device data.  Last Pain:  Vitals:   05/12/21 0941  TempSrc:   PainSc: 0-No pain         Complications: No notable events documented.

## 2021-05-12 NOTE — H&P (Signed)
Outpatient short stay form Pre-procedure 05/12/2021  Lesly Rubenstein, MD  Primary Physician: Jerrol Banana., MD  Reason for visit:  Dyspepsia  History of present illness:   55 y/o lady with history of hypertension, HLD, and DM II here for EGD for dyspepsia. No blood thinners. No family history of GI malignancies.    Current Facility-Administered Medications:    0.9 %  sodium chloride infusion, , Intravenous, Continuous, Orean Giarratano, Hilton Cork, MD, Last Rate: 20 mL/hr at 05/12/21 0841, New Bag at 05/12/21 0841  Medications Prior to Admission  Medication Sig Dispense Refill Last Dose   albuterol (VENTOLIN HFA) 108 (90 Base) MCG/ACT inhaler Inhale 2 puffs into the lungs every 6 (six) hours as needed for wheezing or shortness of breath. 8 g 2 Past Month   aspirin EC 81 MG tablet Take 81 mg by mouth daily.   05/11/2021   atorvastatin (LIPITOR) 40 MG tablet Take 1 tablet (40 mg total) by mouth daily. 90 tablet 3 05/11/2021   benazepril (LOTENSIN) 40 MG tablet Take 1 tablet (40 mg total) by mouth daily. 90 tablet 3 05/12/2021   CALCIUM-VITAMIN D PO Take 2 tablets by mouth daily.    05/11/2021   celecoxib (CELEBREX) 200 MG capsule Take 1 capsule (200 mg total) by mouth daily. 90 capsule 1 05/11/2021   doxycycline (PERIOSTAT) 20 MG tablet Take 1 tablet (20 mg total) by mouth 2 (two) times daily. Take with food 180 tablet 1 05/11/2021   Dulaglutide (TRULICITY) 3.24 MW/1.0UV SOPN INJECT 0.75 MG INTO THE SKIN ONCE A WEEK. 6 mL 1 Past Week   fexofenadine (ALLEGRA) 180 MG tablet Take 180 mg by mouth in the morning.   05/11/2021   fluconazole (DIFLUCAN) 150 MG tablet Take 150 mg by mouth once a week. And prn   Past Week   glipiZIDE (GLUCOTROL) 10 MG tablet Take 1 tablet (10 mg total) by mouth daily before breakfast. 90 tablet 3 05/11/2021   loratadine (CLARITIN) 10 MG tablet Take 10 mg by mouth daily.   Past Week   meclizine (ANTIVERT) 25 MG tablet Take 1 tablet (25 mg total) by mouth 3 (three) times  daily as needed for dizziness. 30 tablet 2 Past Month   metFORMIN (GLUCOPHAGE) 1000 MG tablet TAKE 1 TABLET BY MOUTH 2 TIMES DAILY WITH A MEAL. 180 tablet 3 05/11/2021   methylPREDNISolone (MEDROL DOSEPAK) 4 MG TBPK tablet Take as directed 21 each 0 Past Month   Multiple Vitamin (MULTIVITAMIN) capsule Take 1 capsule by mouth daily.   05/11/2021   naproxen (NAPROSYN) 500 MG tablet Take 1 tablet (500 mg total) by mouth 2 (two) times daily as needed. 180 tablet 1 05/11/2021   nitroGLYCERIN (NITROSTAT) 0.4 MG SL tablet PLACE 1 TABLET UNDER THE TONGUE EVERY 5 MINUTES AS NEEDED FOR CHEST PAIN. 150 tablet 1 Past Month   omeprazole (PRILOSEC) 20 MG capsule TAKE 1 CAPSULE BY MOUTH 2 TIMES DAILY BEFORE A MEAL. 180 capsule 3 Past Month   ondansetron (ZOFRAN ODT) 4 MG disintegrating tablet Take 1 tablet (4 mg total) by mouth every 8 (eight) hours as needed for nausea or vomiting. 20 tablet 0 Past Month   oxyCODONE (OXY IR/ROXICODONE) 5 MG immediate release tablet Take 1 tablet (5 mg total) by mouth every 4 (four) hours as needed for severe pain. 20 tablet 0 Past Month   pantoprazole (PROTONIX) 40 MG tablet Take 40 mg by mouth daily.   05/11/2021   prochlorperazine (COMPAZINE) 10 MG tablet Take 1 tablet (  10 mg total) by mouth every 6 (six) hours as needed for nausea or vomiting (migraine). 15 tablet 0 Past Month   promethazine (PHENERGAN) 25 MG tablet Take 1 tablet (25 mg total) by mouth every 6 (six) hours as needed for nausea or vomiting. 35 tablet 0 Past Month   sucralfate (CARAFATE) 1 g tablet Take 1 tablet (1 g total) by mouth 4 (four) times daily -  before meals and at bedtime. 120 tablet 11 Past Week   traMADol (ULTRAM) 50 MG tablet Take 50 mg by mouth every 6 (six) hours as needed.   Past Week   levonorgestrel (LILETTA) 19.5 MCG/DAY IUD IUD by Intrauterine route.      metoCLOPramide (REGLAN) 5 MG/ML injection Inject 10 mg into the vein daily as needed (migraine).       RESTASIS 0.05 % ophthalmic emulsion  Place 1 drop into both eyes 2 (two) times daily.         Allergies  Allergen Reactions   Latex Rash and Swelling    Eye swelling    Butorphanol Other (See Comments)    drowsiness Somnolence Pt "knocked out for 12 hrs" when taking this    Lactose Intolerance (Gi) Other (See Comments)    Reflux and Indigestion     Past Medical History:  Diagnosis Date   Allergy    Anxiety    Arthritis    KNEE   Asthma    "mild" per pt   Complication of anesthesia    PT HAD A MI AFTER KNEE SURGERY IN 2014 FROM HIGH BLOOD PRESSURE-SURGERY WAS DONE AT AN [facility]   Diabetes mellitus without complication ([place])    Dysrhythmia    sometimes, d/t stress, followed by cardiology   History of hiatal hernia    History of kidney stones    Hypertension    Hypomagnesemia    Melanoma in Situ    >28 years ago   Migraine    Myocardial infarction [facility]) 2014   no stents   Palpitation    Pneumonia    h/o   Simple chronic bronchitis (HCC)    Syncope and collapse 07/30/2013   Varicose veins of both lower extremities with pain     Review of systems:  Otherwise negative.    Physical Exam  Gen: Alert, oriented. Appears stated age.  HEENT: PERRLA. Lungs: No respiratory distress. CV: RRR Abd: soft, benign, no masses Ext: No edema    Planned procedures: Proceed with EGD. The patient understands the nature of the planned procedure, indications, risks, alternatives and potential complications including but not limited to bleeding, infection, perforation, damage to internal organs and possible oversedation/side effects from anesthesia. The patient agrees and gives consent to proceed.  Please refer to procedure notes for findings, recommendations and patient disposition/instructions.     Lesly Rubenstein, MD [facility]

## 2021-05-12 NOTE — Anesthesia Procedure Notes (Signed)
Date/Time: 05/12/2021 9:26 AM Performed by: Lily Peer, Judeen Geralds, CRNA Pre-anesthesia Checklist: Patient identified, Emergency Drugs available, Suction available, Patient being monitored and Timeout performed Patient Re-evaluated:Patient Re-evaluated prior to induction Oxygen Delivery Method: Simple face mask Induction Type: IV induction

## 2021-05-12 NOTE — Anesthesia Postprocedure Evaluation (Signed)
Anesthesia Post Note  Patient: Judith Fox  Procedure(s) Performed: ESOPHAGOGASTRODUODENOSCOPY (EGD) WITH PROPOFOL  Patient location during evaluation: Endoscopy Anesthesia Type: General Level of consciousness: awake and alert and oriented Pain management: pain level controlled Vital Signs Assessment: post-procedure vital signs reviewed and stable Respiratory status: spontaneous breathing, nonlabored ventilation and respiratory function stable Cardiovascular status: blood pressure returned to baseline and stable Postop Assessment: no signs of nausea or vomiting Anesthetic complications: no   No notable events documented.   Last Vitals:  Vitals:   05/12/21 1002 05/12/21 1016  BP: (!) 151/96 (!) 166/90  Pulse:    Resp:    Temp:    SpO2:      Last Pain:  Vitals:   05/12/21 1002  TempSrc:   PainSc: 0-No pain                 Sangeeta Youse

## 2021-05-12 NOTE — Op Note (Signed)
[facility] Patient Name: Judith Fox Procedure Date: 05/12/2021 9:09 AM MRN: [id number] Account #: [id number] Date of Birth: 11-29-1965 Admit Type: Outpatient Age: 55 Room: [facility] ENDO ROOM 1 Gender: Female Note Status: Finalized Instrument Name: Altamese Cabal Endoscope [id number] Procedure:             Upper GI endoscopy Indications:           Dyspepsia Providers:             Andrey Farmer MD, MD Referring MD:          Janine Ores. Rosanna Randy, MD (Referring MD) Medicines:             Monitored Anesthesia Care Complications:         No immediate complications. Estimated blood loss:                         Minimal. Procedure:             Pre-Anesthesia Assessment:                        - Prior to the procedure, a History and Physical was                         performed, and patient medications and allergies were                         reviewed. The patient is competent. The risks and                         benefits of the procedure and the sedation options and                         risks were discussed with the patient. All questions                         were answered and informed consent was obtained.                         Patient identification and proposed procedure were                         verified by the physician, the nurse, the anesthetist                         and the technician in the endoscopy suite. Mental                         Status Examination: alert and oriented. Airway                         Examination: normal oropharyngeal airway and neck                         mobility. Respiratory Examination: clear to                         auscultation. CV Examination: normal. Prophylactic  Antibiotics: The patient does not require prophylactic                         antibiotics. Prior Anticoagulants: The patient has                         taken no previous anticoagulant or antiplatelet agents                          except for aspirin. ASA Grade Assessment: II - A                         patient with mild systemic disease. After reviewing                         the risks and benefits, the patient was deemed in                         satisfactory condition to undergo the procedure. The                         anesthesia plan was to use monitored anesthesia care                         (MAC). Immediately prior to administration of                         medications, the patient was re-assessed for adequacy                         to receive sedatives. The heart rate, respiratory                         rate, oxygen saturations, blood pressure, adequacy of                         pulmonary ventilation, and response to care were                         monitored throughout the procedure. The physical                         status of the patient was re-assessed after the                         procedure.                        After obtaining informed consent, the endoscope was                         passed under direct vision. Throughout the procedure,                         the patient's blood pressure, pulse, and oxygen                         saturations were monitored continuously. The Endoscope  was introduced through the mouth, and advanced to the                         second part of duodenum. The upper GI endoscopy was                         accomplished without difficulty. The patient tolerated                         the procedure well. Findings:      A 4 cm hiatal hernia was present.      The examined esophagus was normal.      Multiple 2 to 20 mm pedunculated and sessile fundic gland polyps with no       bleeding and no stigmata of recent bleeding were found in the gastric       body. Biopsies were taken with a cold forceps for histology. Estimated       blood loss was minimal.      Normal mucosa was found in the entire examined stomach.  Biopsies were       taken with a cold forceps for Helicobacter pylori testing. Estimated       blood loss was minimal.      The examined duodenum was normal. Impression:            - 4 cm hiatal hernia.                        - Normal esophagus.                        - Multiple fundic gland polyps. Biopsied.                        - Normal mucosa was found in the entire stomach.                         Biopsied.                        - Normal examined duodenum. Recommendation:        - Discharge patient to home.                        - Resume previous diet.                        - Continue present medications.                        - Await pathology results.                        - Return to referring physician as previously                         scheduled. Procedure Code(s):     --- Professional ---                        [phone], Esophagogastroduodenoscopy, flexible,  transoral; with biopsy, single or multiple Diagnosis Code(s):     --- Professional ---                        K44.9, Diaphragmatic hernia without obstruction or                         gangrene                        K31.7, Polyp of stomach and duodenum                        R10.13, Epigastric pain CPT copyright 2019 American Medical Association. All rights reserved. The codes documented in this report are preliminary and upon coder review may  be revised to meet current compliance requirements. Andrey Farmer MD, MD 05/12/2021 9:42:12 AM Number of Addenda: 0 Note Initiated On: 05/12/2021 9:09 AM Estimated Blood Loss:  Estimated blood loss was minimal.      [facility]

## 2021-05-12 NOTE — Interval H&P Note (Signed)
History and Physical Interval Note:  05/12/2021 9:19 AM  Judith Fox  has presented today for surgery, with the diagnosis of RUQ pain & Epigastric.  The various methods of treatment have been discussed with the patient and family. After consideration of risks, benefits and other options for treatment, the patient has consented to  Procedure(s): ESOPHAGOGASTRODUODENOSCOPY (EGD) WITH PROPOFOL (N/A) as a surgical intervention.  The patient's history has been reviewed, patient examined, no change in status, stable for surgery.  I have reviewed the patient's chart and labs.  Questions were answered to the patient's satisfaction.     Lesly Rubenstein  Ok to proceed with EGD

## 2021-05-13 ENCOUNTER — Encounter: Payer: Self-pay | Admitting: Gastroenterology

## 2021-05-13 LAB — SURGICAL PATHOLOGY

## 2021-05-14 ENCOUNTER — Ambulatory Visit
Admission: RE | Admit: 2021-05-14 | Discharge: 2021-05-14 | Disposition: A | Discharge status: Home / Self Care | Payer: 59 | Source: Ambulatory Visit | Attending: Gastroenterology | Admitting: Gastroenterology

## 2021-05-14 ENCOUNTER — Other Ambulatory Visit: Payer: Self-pay

## 2021-05-14 DIAGNOSIS — R1011 Right upper quadrant pain: Secondary | ICD-10-CM | POA: Insufficient documentation

## 2021-05-14 IMAGING — US US ABDOMEN LIMITED
1 series · 14 of 25 positions shown · non-contrast
Comparison: None.

CLINICAL DATA: Right upper quadrant pain and nausea for the past
2-3 months

EXAM:
ULTRASOUND ABDOMEN LIMITED RIGHT UPPER QUADRANT

[Series 1: us abdomen limited · 0.22mm/px · 14 of 46 slices shown]
[im 1/46]
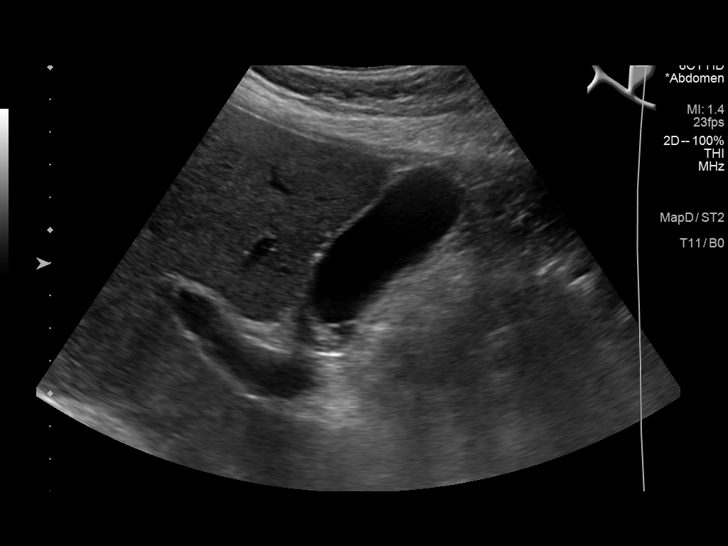
[im 4/46]
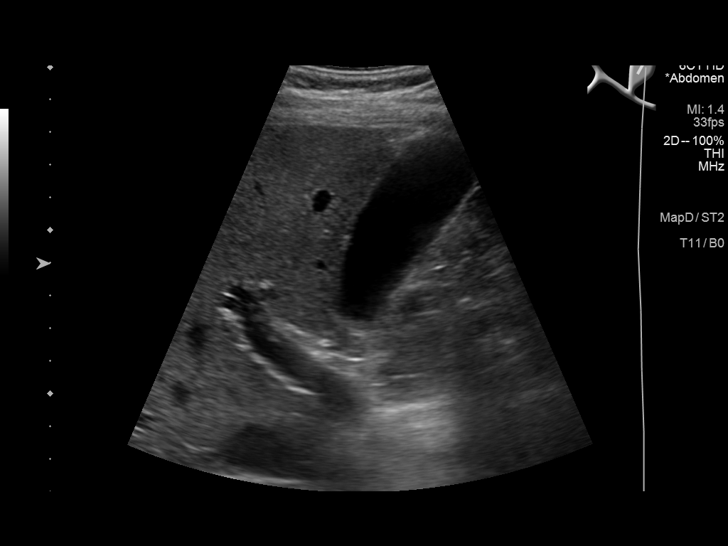
[im 8/46]
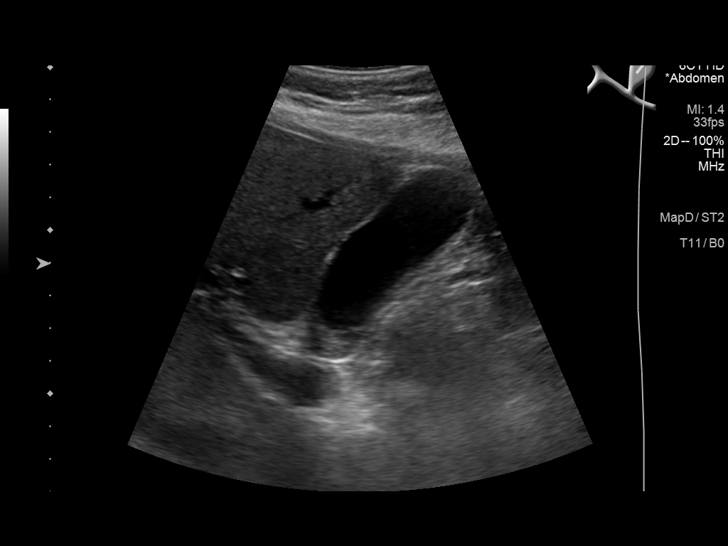
[im 12/46]
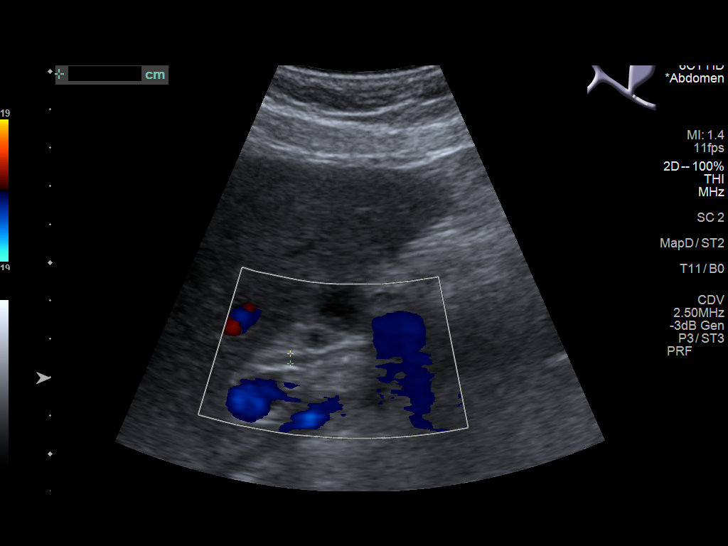
[im 16/46]
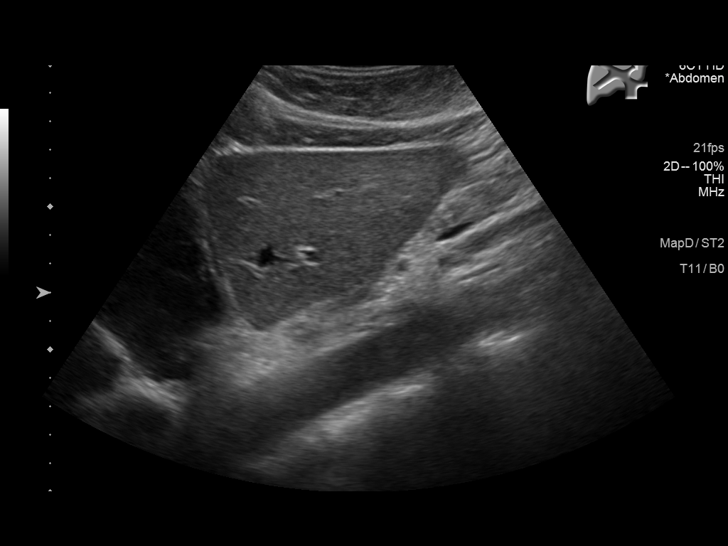
[im 17/46]
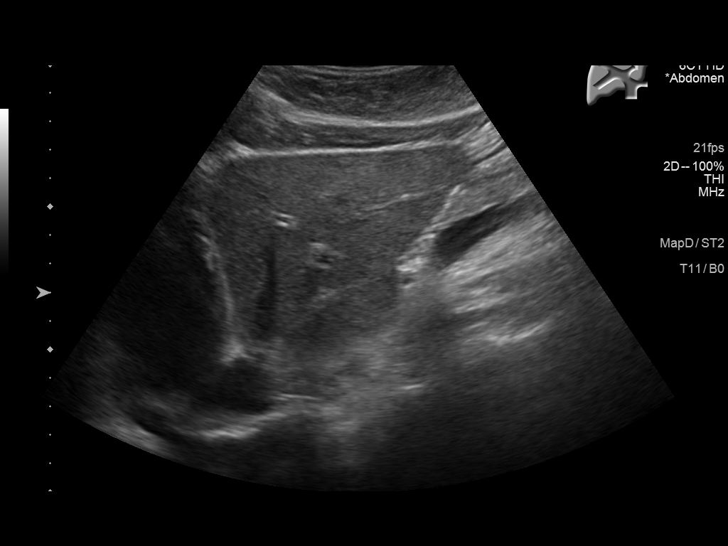
[im 21/46]
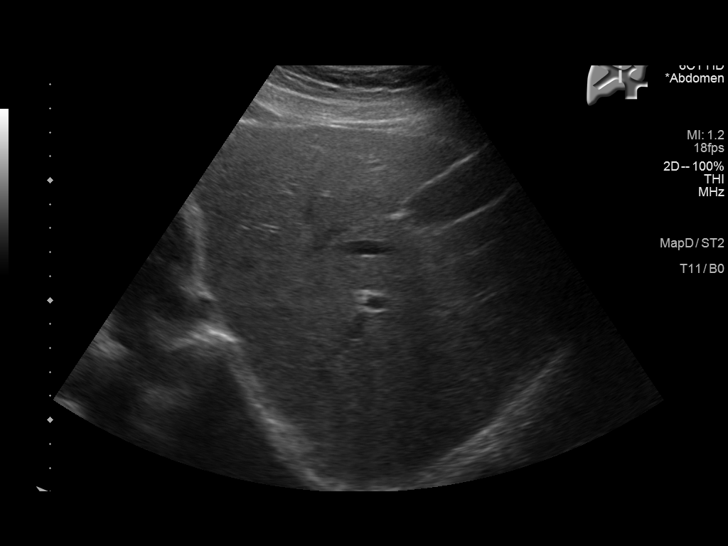
[im 25/46]
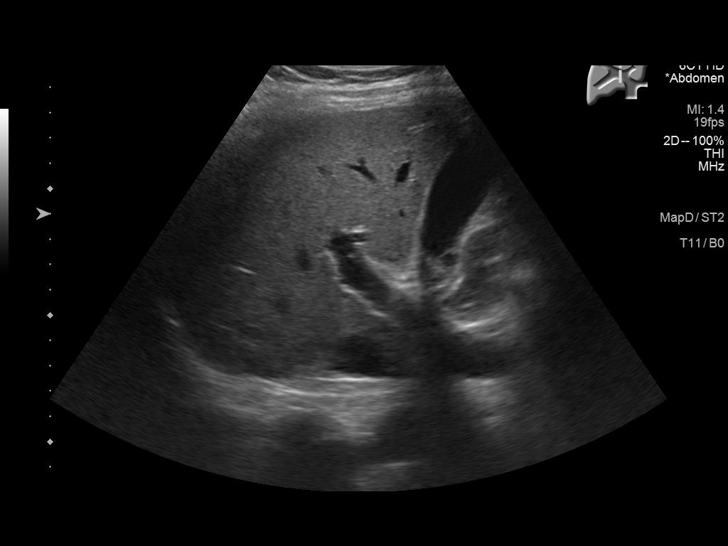
[im 29/46]
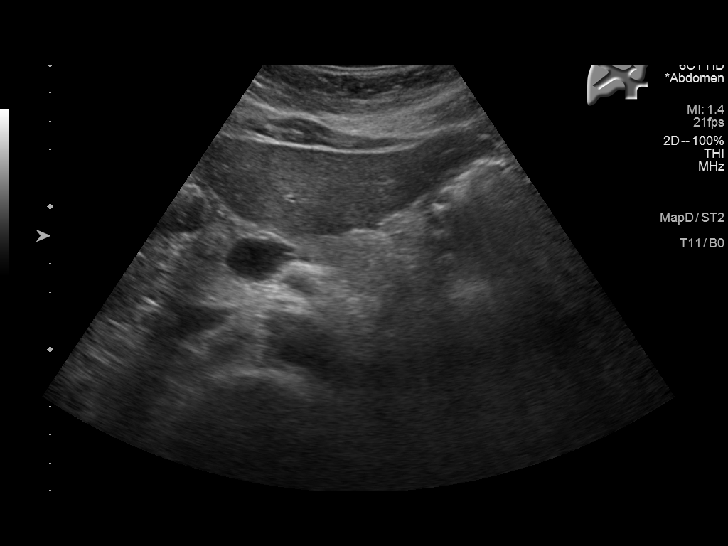
[im 31/46]
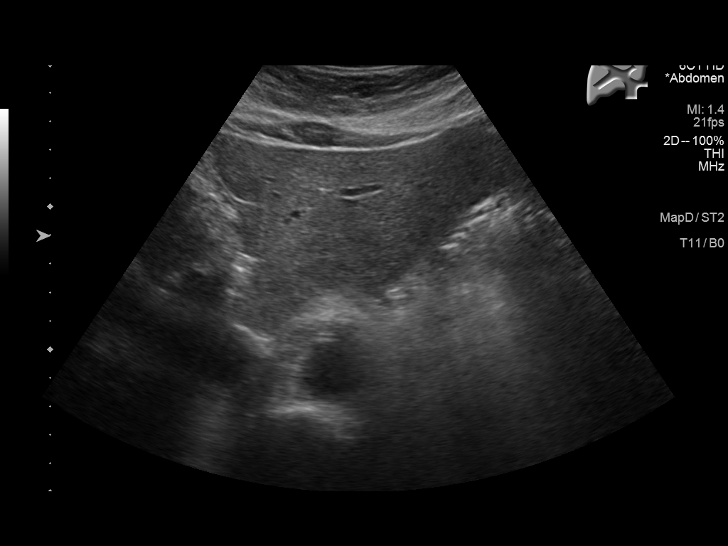
[im 34/46]
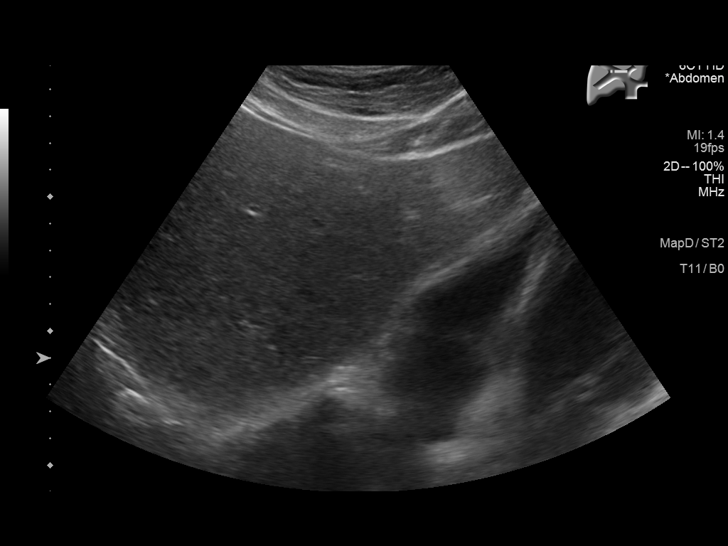
[im 38/46]
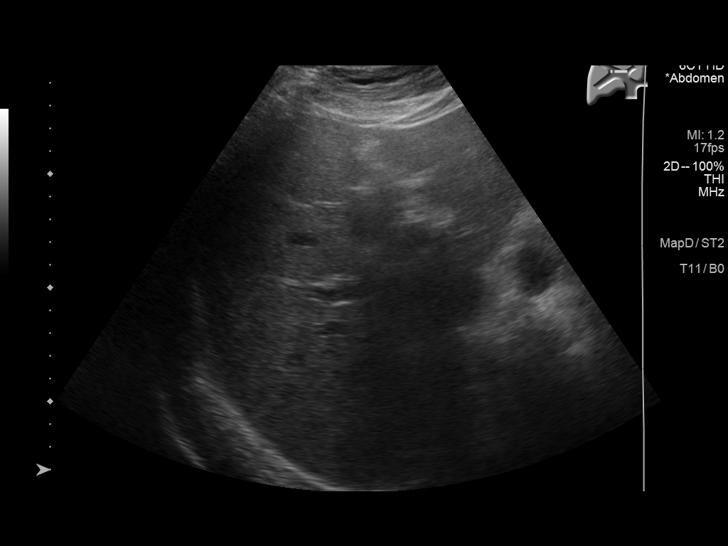
[im 42/46]
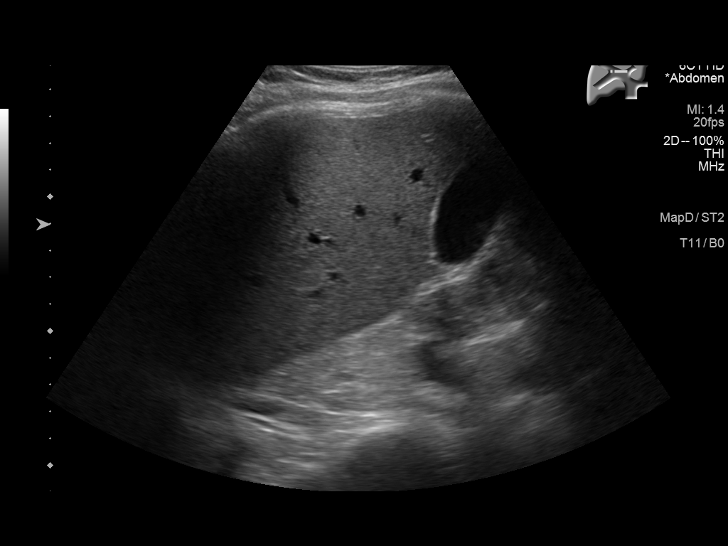
[im 46/46]
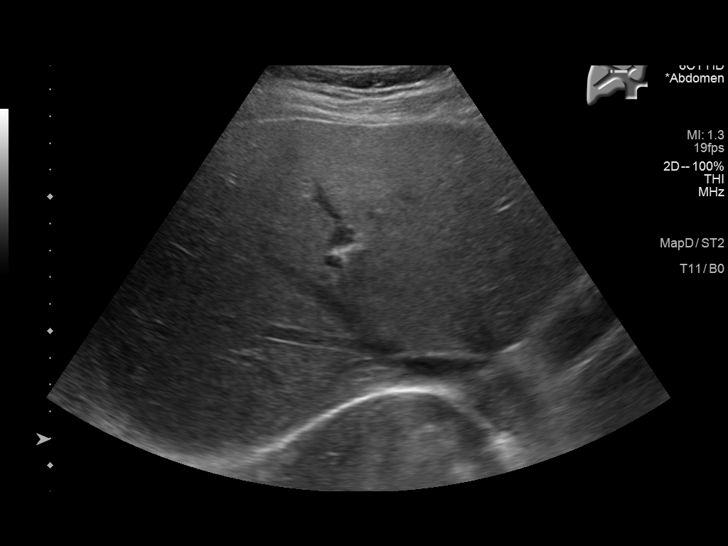

[14 of 25 positions shown; findings below may reference images not displayed]

FINDINGS: Gallbladder:

No gallstones or wall thickening visualized. No sonographic Murphy
sign noted by sonographer.

Common bile duct:

Diameter: Normal at 3 mm

Liver:

No focal lesion identified. Within normal limits in parenchymal
echogenicity. Portal vein is patent on color Doppler imaging with
normal direction of blood flow towards the liver.

Other: None.
IMPRESSION: Normal right upper quadrant ultrasound.

## 2021-05-18 ENCOUNTER — Ambulatory Visit: Payer: 59

## 2021-05-24 ENCOUNTER — Other Ambulatory Visit: Payer: Self-pay | Admitting: Family Medicine

## 2021-05-24 DIAGNOSIS — I519 Heart disease, unspecified: Secondary | ICD-10-CM

## 2021-05-24 NOTE — Telephone Encounter (Signed)
Requested Prescriptions  Pending Prescriptions Disp Refills  . nitroGLYCERIN (NITROSTAT) 0.4 MG SL tablet [Pharmacy Med Name: NITROGLYCERIN 0.4 MG TABLET SL] 150 tablet 1    Sig: PLACE 1 TABLET UNDER THE TONGUE EVERY 5 MINUTES AS NEEDED FOR CHEST PAIN.     Cardiovascular:  Nitrates Failed - 05/24/2021 11:31 AM      Failed - Last BP in normal range    BP Readings from Last 1 Encounters:  05/12/21 (!) 166/90         Passed - Last Heart Rate in normal range    Pulse Readings from Last 1 Encounters:  05/12/21 84         Passed - Valid encounter within last 12 months    Recent Outpatient Visits          2 weeks ago Type 2 diabetes mellitus without complication, without long-term current use of insulin [facility])   [facility] Jerrol Banana., MD   4 months ago Type 2 diabetes mellitus without complication, without long-term current use of insulin [facility])   [facility] Jerrol Banana., MD   7 months ago [place] Jerrol Banana., MD   8 months ago Sinusitis, unspecified chronicity, unspecified location   [facility] Jerrol Banana., MD   9 months ago Subacute pansinusitis   [facility] Jerrol Banana., MD      Future Appointments            In 3 months Jerrol Banana., MD [facility], [place]

## 2021-05-26 ENCOUNTER — Other Ambulatory Visit: Payer: Self-pay | Admitting: Gastroenterology

## 2021-05-26 DIAGNOSIS — R1013 Epigastric pain: Secondary | ICD-10-CM

## 2021-05-26 DIAGNOSIS — R1011 Right upper quadrant pain: Secondary | ICD-10-CM

## 2021-06-11 ENCOUNTER — Ambulatory Visit: Payer: 59

## 2021-06-21 ENCOUNTER — Other Ambulatory Visit: Payer: Self-pay | Admitting: Family Medicine

## 2021-06-21 DIAGNOSIS — I519 Heart disease, unspecified: Secondary | ICD-10-CM

## 2021-06-22 NOTE — Telephone Encounter (Signed)
Requested Prescriptions  Pending Prescriptions Disp Refills  . nitroGLYCERIN (NITROSTAT) 0.4 MG SL tablet [Pharmacy Med Name: NITROGLYCERIN 0.4 MG TABLET SL] 150 tablet 1    Sig: PLACE 1 TABLET UNDER THE TONGUE EVERY 5 MINUTES AS NEEDED FOR CHEST PAIN.     Cardiovascular:  Nitrates Failed - 06/21/2021 11:34 AM      Failed - Last BP in normal range    BP Readings from Last 1 Encounters:  05/12/21 (!) 166/90         Passed - Last Heart Rate in normal range    Pulse Readings from Last 1 Encounters:  05/12/21 84         Passed - Valid encounter within last 12 months    Recent Outpatient Visits          1 month ago Type 2 diabetes mellitus without complication, without long-term current use of insulin [facility])   [facility] Jerrol Banana., MD   5 months ago Type 2 diabetes mellitus without complication, without long-term current use of insulin [facility])   [facility] Jerrol Banana., MD   8 months ago [place] Jerrol Banana., MD   9 months ago Sinusitis, unspecified chronicity, unspecified location   [facility] Jerrol Banana., MD   9 months ago Subacute pansinusitis   [facility] Jerrol Banana., MD      Future Appointments            In 2 months Jerrol Banana., MD [facility] [place]

## 2021-07-13 ENCOUNTER — Other Ambulatory Visit: Payer: Self-pay | Admitting: Obstetrics and Gynecology

## 2021-07-13 DIAGNOSIS — Z1231 Encounter for screening mammogram for malignant neoplasm of breast: Secondary | ICD-10-CM

## 2021-07-16 ENCOUNTER — Encounter: Payer: Self-pay | Admitting: Family Medicine

## 2021-07-16 LAB — HM DIABETES EYE EXAM

## 2021-08-13 ENCOUNTER — Telehealth: Payer: 59 | Admitting: Physician Assistant

## 2021-08-13 ENCOUNTER — Ambulatory Visit: Payer: Self-pay | Admitting: *Deleted

## 2021-08-13 DIAGNOSIS — B9689 Other specified bacterial agents as the cause of diseases classified elsewhere: Secondary | ICD-10-CM

## 2021-08-13 DIAGNOSIS — J019 Acute sinusitis, unspecified: Secondary | ICD-10-CM | POA: Diagnosis not present

## 2021-08-13 MED ORDER — BENZONATATE 100 MG PO CAPS: 100.0000 mg | ORAL_CAPSULE | Freq: Three times a day (TID) | ORAL | 0 refills | Status: DC | PRN

## 2021-08-13 MED ORDER — DOXYCYCLINE HYCLATE 100 MG PO TABS: 100.0000 mg | ORAL_TABLET | Freq: Two times a day (BID) | ORAL | 0 refills | Status: DC

## 2021-08-13 NOTE — Telephone Encounter (Signed)
FYI

## 2021-08-13 NOTE — Patient Instructions (Signed)
Judith Fox, thank you for joining Judith Rio, PA-C for today's virtual visit.  While this provider is not your primary care provider (PCP), if your PCP is located in our provider database this encounter information will be shared with them immediately following your visit.  Consent: (Patient) Judith Fox provided verbal consent for this virtual visit at the beginning of the encounter.  Current Medications:  Current Outpatient Medications:    benzonatate (TESSALON) 100 MG capsule, Take 1 capsule (100 mg total) by mouth 3 (three) times daily as needed for cough., Disp: 30 capsule, Rfl: 0   doxycycline (VIBRA-TABS) 100 MG tablet, Take 1 tablet (100 mg total) by mouth 2 (two) times daily., Disp: 20 tablet, Rfl: 0   albuterol (VENTOLIN HFA) 108 (90 Base) MCG/ACT inhaler, Inhale 2 puffs into the lungs every 6 (six) hours as needed for wheezing or shortness of breath., Disp: 8 g, Rfl: 2   aspirin EC 81 MG tablet, Take 81 mg by mouth daily., Disp: , Rfl:    atorvastatin (LIPITOR) 40 MG tablet, Take 1 tablet (40 mg total) by mouth daily., Disp: 90 tablet, Rfl: 3   benazepril (LOTENSIN) 40 MG tablet, Take 1 tablet (40 mg total) by mouth daily., Disp: 90 tablet, Rfl: 3   CALCIUM-VITAMIN D PO, Take 2 tablets by mouth daily. , Disp: , Rfl:    celecoxib (CELEBREX) 200 MG capsule, Take 1 capsule (200 mg total) by mouth daily., Disp: 90 capsule, Rfl: 1   doxycycline (PERIOSTAT) 20 MG tablet, Take 1 tablet (20 mg total) by mouth 2 (two) times daily. Take with food, Disp: 180 tablet, Rfl: 1   Dulaglutide (TRULICITY) 1.94 RD/4.0CX SOPN, INJECT 0.75 MG INTO THE SKIN ONCE A WEEK., Disp: 6 mL, Rfl: 1   fexofenadine (ALLEGRA) 180 MG tablet, Take 180 mg by mouth in the morning., Disp: , Rfl:    fluconazole (DIFLUCAN) 150 MG tablet, Take 150 mg by mouth once a week. And prn, Disp: , Rfl:    glipiZIDE (GLUCOTROL) 10 MG tablet, Take 1 tablet (10 mg total) by mouth daily before breakfast.,  Disp: 90 tablet, Rfl: 3   levonorgestrel (LILETTA) 19.5 MCG/DAY IUD IUD, by Intrauterine route., Disp: , Rfl:    loratadine (CLARITIN) 10 MG tablet, Take 10 mg by mouth daily., Disp: , Rfl:    meclizine (ANTIVERT) 25 MG tablet, Take 1 tablet (25 mg total) by mouth 3 (three) times daily as needed for dizziness., Disp: 30 tablet, Rfl: 2   metFORMIN (GLUCOPHAGE) 1000 MG tablet, TAKE 1 TABLET BY MOUTH 2 TIMES DAILY WITH A MEAL., Disp: 180 tablet, Rfl: 3   methylPREDNISolone (MEDROL DOSEPAK) 4 MG TBPK tablet, Take as directed, Disp: 21 each, Rfl: 0   metoCLOPramide (REGLAN) 5 MG/ML injection, Inject 10 mg into the vein daily as needed (migraine). , Disp: , Rfl:    Multiple Vitamin (MULTIVITAMIN) capsule, Take 1 capsule by mouth daily., Disp: , Rfl:    naproxen (NAPROSYN) 500 MG tablet, Take 1 tablet (500 mg total) by mouth 2 (two) times daily as needed., Disp: 180 tablet, Rfl: 1   nitroGLYCERIN (NITROSTAT) 0.4 MG SL tablet, PLACE 1 TABLET UNDER THE TONGUE EVERY 5 MINUTES AS NEEDED FOR CHEST PAIN., Disp: 150 tablet, Rfl: 1   omeprazole (PRILOSEC) 20 MG capsule, TAKE 1 CAPSULE BY MOUTH 2 TIMES DAILY BEFORE A MEAL., Disp: 180 capsule, Rfl: 3   ondansetron (ZOFRAN ODT) 4 MG disintegrating tablet, Take 1 tablet (4 mg total) by mouth every 8 (  eight) hours as needed for nausea or vomiting., Disp: 20 tablet, Rfl: 0   oxyCODONE (OXY IR/ROXICODONE) 5 MG immediate release tablet, Take 1 tablet (5 mg total) by mouth every 4 (four) hours as needed for severe pain., Disp: 20 tablet, Rfl: 0   pantoprazole (PROTONIX) 40 MG tablet, Take 40 mg by mouth daily., Disp: , Rfl:    prochlorperazine (COMPAZINE) 10 MG tablet, Take 1 tablet (10 mg total) by mouth every 6 (six) hours as needed for nausea or vomiting (migraine)., Disp: 15 tablet, Rfl: 0   promethazine (PHENERGAN) 25 MG tablet, Take 1 tablet (25 mg total) by mouth every 6 (six) hours as needed for nausea or vomiting., Disp: 35 tablet, Rfl: 0   RESTASIS 0.05 %  ophthalmic emulsion, Place 1 drop into both eyes 2 (two) times daily. , Disp: , Rfl:    sucralfate (CARAFATE) 1 g tablet, Take 1 tablet (1 g total) by mouth 4 (four) times daily -  before meals and at bedtime., Disp: 120 tablet, Rfl: 11   traMADol (ULTRAM) 50 MG tablet, Take 50 mg by mouth every 6 (six) hours as needed., Disp: , Rfl:    Medications ordered in this encounter:  Meds ordered this encounter  Medications   benzonatate (TESSALON) 100 MG capsule    Sig: Take 1 capsule (100 mg total) by mouth 3 (three) times daily as needed for cough.    Dispense:  30 capsule    Refill:  0    Order Specific Question:   Supervising Provider    Answer:   Judith Fox [[id number]]   doxycycline (VIBRA-TABS) 100 MG tablet    Sig: Take 1 tablet (100 mg total) by mouth 2 (two) times daily.    Dispense:  20 tablet    Refill:  0    Order Specific Question:   Supervising Provider    Answer:   Judith Fox [[id number]]     *If you need refills on other medications prior to your next appointment, please contact your pharmacy*  Follow-Up: Call back or seek an in-person evaluation if the symptoms worsen or if the condition fails to improve as anticipated.  Other Instructions Please take antibiotic as directed.  Increase fluid intake.  Use Saline nasal spray.  Take a daily multivitamin. Use the Tessalon as directed with cough. You can continue your allergy medications.  Place a humidifier in the bedroom.  Please call or return clinic if symptoms are not improving.  Sinusitis Sinusitis is redness, soreness, and swelling (inflammation) of the paranasal sinuses. Paranasal sinuses are air pockets within the bones of your face (beneath the eyes, the middle of the forehead, or above the eyes). In healthy paranasal sinuses, mucus is able to drain out, and air is able to circulate through them by way of your nose. However, when your paranasal sinuses are inflamed, mucus and air can become trapped. This can allow bacteria and  other germs to grow and cause infection. Sinusitis can develop quickly and last only a short time (acute) or continue over a long period (chronic). Sinusitis that lasts for more than 12 weeks is considered chronic.  CAUSES  Causes of sinusitis include: Allergies. Structural abnormalities, such as displacement of the cartilage that separates your nostrils (deviated septum), which can decrease the air flow through your nose and sinuses and affect sinus drainage. Functional abnormalities, such as when the small hairs (cilia) that line your sinuses and help remove mucus do not work properly or are not present. SYMPTOMS  Symptoms of acute and chronic sinusitis are the same. The primary symptoms are pain and pressure around the affected sinuses. Other symptoms include: Upper toothache. Earache. Headache. Bad breath. Decreased sense of smell and taste. A cough, which worsens when you are lying flat. Fatigue. Fever. Thick drainage from your nose, which often is green and may contain pus (purulent). Swelling and warmth over the affected sinuses. DIAGNOSIS  Your caregiver will perform a physical exam. During the exam, your caregiver may: Look in your nose for signs of abnormal growths in your nostrils (nasal polyps). Tap over the affected sinus to check for signs of infection. View the inside of your sinuses (endoscopy) with a special imaging device with a light attached (endoscope), which is inserted into your sinuses. If your caregiver suspects that you have chronic sinusitis, one or more of the following tests may be recommended: Allergy tests. Nasal culture A sample of mucus is taken from your nose and sent to a lab and screened for bacteria. Nasal cytology A sample of mucus is taken from your nose and examined by your caregiver to determine if your sinusitis is related to an allergy. TREATMENT  Most cases of acute sinusitis are related to a viral infection and will resolve on their own within  10 days. Sometimes medicines are prescribed to help relieve symptoms (pain medicine, decongestants, nasal steroid sprays, or saline sprays).  However, for sinusitis related to a bacterial infection, your caregiver will prescribe antibiotic medicines. These are medicines that will help kill the bacteria causing the infection.  Rarely, sinusitis is caused by a fungal infection. In theses cases, your caregiver will prescribe antifungal medicine. For some cases of chronic sinusitis, surgery is needed. Generally, these are cases in which sinusitis recurs more than 3 times per year, despite other treatments. HOME CARE INSTRUCTIONS  Drink plenty of water. Water helps thin the mucus so your sinuses can drain more easily. Use a humidifier. Inhale steam 3 to 4 times a day (for example, sit in the bathroom with the shower running). Apply a warm, moist washcloth to your face 3 to 4 times a day, or as directed by your caregiver. Use saline nasal sprays to help moisten and clean your sinuses. Take over-the-counter or prescription medicines for pain, discomfort, or fever only as directed by your caregiver. SEEK IMMEDIATE MEDICAL CARE IF: You have increasing pain or severe headaches. You have nausea, vomiting, or drowsiness. You have swelling around your face. You have vision problems. You have a stiff neck. You have difficulty breathing. MAKE SURE YOU:  Understand these instructions. Will watch your condition. Will get help right away if you are not doing well or get worse. Document Released: 07/26/2005 Document Revised: 10/18/2011 Document Reviewed: 08/10/2011 [facility] Patient Information 2014 [place], [place].    If you have been instructed to have an in-person evaluation today at a local Urgent Care facility, please use the link below. It will take you to a list of all of our available [facility], including address, phone number and hours of operation. Please do not delay care.  [facility]  If you or a family member do not have a primary care provider, use the link below to schedule a visit and establish care. When you choose a [facility] primary care physician or advanced practice provider, you gain a long-term partner in health. Find a Primary Care Provider  Learn more about 's in-office and virtual care options: [facility]

## 2021-08-13 NOTE — Telephone Encounter (Signed)
Reason for Disposition  [1] Sinus pain (not just congestion) AND [2] fever  Answer Assessment - Initial Assessment Questions 1. LOCATION: "Where does it hurt?"      I returned her call.   She has a history of sinus infections.   I'm getting over the flu from last week but now I'm getting sinus symptoms, congestion, headaches, sore throat, fever.    She is unhappy that [facility] can't see her when she calls.   Finished Tamiflu yesterday.   Was seen at the urgent care not happy with that. 2. ONSET: "When did the sinus pain start?"  (e.g., hours, days)      Yesterday pain pressure, coughing, sore throat, headaches, a lot of congestion, post nasal drip and fever.   Not getting much up now but it was green.    3. SEVERITY: "How bad is the pain?"   (Scale 1-10; mild, moderate or severe)   - MILD (1-3): doesn't interfere with normal activities    - MODERATE (4-7): interferes with normal activities (e.g., work or school) or awakens from sleep   - SEVERE (8-10): excruciating pain and patient unable to do any normal activities        *No Answer* 4. RECURRENT SYMPTOM: "Have you ever had sinus problems before?" If Yes, ask: "When was the last time?" and "What happened that time?"      Yes 5. NASAL CONGESTION: "Is the nose blocked?" If Yes, ask: "Can you open it or must you breathe through your mouth?"     Yes 6. NASAL DISCHARGE: "Do you have discharge from your nose?" If so ask, "What color?"     Yes 7. FEVER: "Do you have a fever?" If Yes, ask: "What is it, how was it measured, and when did it start?"      Yes low grade 8. OTHER SYMPTOMS: "Do you have any other symptoms?" (e.g., sore throat, cough, earache, difficulty breathing)     See above for symptoms. 9. PREGNANCY: "Is there any chance you are pregnant?" "When was your last menstrual period?"     N/A I've had a terrible experience  Protocols used: Sinus Pain or Congestion-A-AH

## 2021-08-13 NOTE — Telephone Encounter (Signed)
°  Chief Complaint: sinus infection after having the flu last week Symptoms: sore throat, fever, coughing, nasal congestion, post nasal drip, sinus pressure and pain Frequency: since yesterday Pertinent Negatives: Patient denies having Covid. Disposition: [] ED /[] Urgent Care (no appt availability in office) / [] Appointment(In office/virtual)/ [x]  [facility]/ [] Home Care/ [] Refused Recommended Disposition /[] [facility]/ []  Follow-up with PCP Additional Notes: I got her scheduled for a video visit via the [facility] web site for today at 10:00.

## 2021-08-13 NOTE — Progress Notes (Signed)
Virtual Visit Consent   Judith Fox, you are scheduled for a virtual visit with a [facility] provider today.     Just as with appointments in the office, your consent must be obtained to participate.  Your consent will be active for this visit and any virtual visit you may have with one of our providers in the next 365 days.     If you have a MyChart account, a copy of this consent can be sent to you electronically.  All virtual visits are billed to your insurance company just like a traditional visit in the office.    As this is a virtual visit, video technology does not allow for your provider to perform a traditional examination.  This may limit your provider's ability to fully assess your condition.  If your provider identifies any concerns that need to be evaluated in person or the need to arrange testing (such as labs, EKG, etc.), we will make arrangements to do so.     Although advances in technology are sophisticated, we cannot ensure that it will always work on either your end or our end.  If the connection with a video visit is poor, the visit may have to be switched to a telephone visit.  With either a video or telephone visit, we are not always able to ensure that we have a secure connection.     I need to obtain your verbal consent now.   Are you willing to proceed with your visit today?    Judith Fox has provided verbal consent on 08/13/2021 for a virtual visit (video or telephone).   Leeanne Rio, [place]   Date: 08/13/2021 10:07 AM   Virtual Visit via Video Note   I, Leeanne Rio, connected with  Judith Fox  ([id number], 1966/02/01) on 08/13/21 at 10:00 AM EST by a video-enabled telemedicine application and verified that I am speaking with the correct person using two identifiers.  Location: Patient: Virtual Visit Location Patient: Home Provider: Virtual Visit Location Provider: Home Office   I discussed the limitations of  evaluation and management by telemedicine and the availability of in person appointments. The patient expressed understanding and agreed to proceed.    History of Present Illness: Judith Fox is a 56 y.o. who identifies as a female who was assigned female at birth, and is being seen today for possible sinusitis. Notes being diagnosed with the flu last week and was started on Tamiflu. Notes she was never formally tested for the flu but did respond well to the Tamiflu. Finished yesterday. Notes she was considerably better but now having worsening of sinus congestion/pressure and sinus pain along with headache and some bilateral ear ache. Some mild residual cough. Notes recurrence of fever starting last night with Tmax (100.3). Notes history of sinus infections when she gets sick so this is not out of the ordinary for her.   HPI: HPI  Problems:  Patient Active Problem List   Diagnosis Date Noted   Melanoma ([place]) 02/17/2021   Generalized weakness 01/23/2020   Hypomagnesemia 01/23/2020   Intestinal infection due to bacteria causing bloody diarrhea 01/23/2020   IUD (intrauterine device) in place 10/25/2019   Cyst of left ovary 10/25/2019   Acute left-sided low back pain without sciatica 10/24/2019   Left lower quadrant abdominal pain 10/23/2019   Varicose veins of both lower extremities with pain 05/16/2018   Bilateral lower extremity edema 05/16/2018   Mixed hyperlipidemia 09/05/2017   Anxiety  06/26/2014   Syncope and collapse 07/30/2013   Palpitations 03/11/2013   Type 2 diabetes mellitus without complication ([place]) 63/87/5643   History of non-ST elevation myocardial infarction (NSTEMI) 03/01/2013   HTN (hypertension) 03/01/2013   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   Chronic pain syndrome 12/27/2011   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   Chronic migraine without aura 10/12/2011   Migraine without aura 10/12/2011    Allergies:  Allergies  Allergen Reactions   Latex  Rash and Swelling    Eye swelling    Butorphanol Other (See Comments)    drowsiness Somnolence Pt "knocked out for 12 hrs" when taking this    Lactose Intolerance (Gi) Other (See Comments)    Reflux and Indigestion   Medications:  Current Outpatient Medications:    benzonatate (TESSALON) 100 MG capsule, Take 1 capsule (100 mg total) by mouth 3 (three) times daily as needed for cough., Disp: 30 capsule, Rfl: 0   doxycycline (VIBRA-TABS) 100 MG tablet, Take 1 tablet (100 mg total) by mouth 2 (two) times daily., Disp: 20 tablet, Rfl: 0   albuterol (VENTOLIN HFA) 108 (90 Base) MCG/ACT inhaler, Inhale 2 puffs into the lungs every 6 (six) hours as needed for wheezing or shortness of breath., Disp: 8 g, Rfl: 2   aspirin EC 81 MG tablet, Take 81 mg by mouth daily., Disp: , Rfl:    atorvastatin (LIPITOR) 40 MG tablet, Take 1 tablet (40 mg total) by mouth daily., Disp: 90 tablet, Rfl: 3   benazepril (LOTENSIN) 40 MG tablet, Take 1 tablet (40 mg total) by mouth daily., Disp: 90 tablet, Rfl: 3   CALCIUM-VITAMIN D PO, Take 2 tablets by mouth daily. , Disp: , Rfl:    celecoxib (CELEBREX) 200 MG capsule, Take 1 capsule (200 mg total) by mouth daily., Disp: 90 capsule, Rfl: 1   doxycycline (PERIOSTAT) 20 MG tablet, Take 1 tablet (20 mg total) by mouth 2 (two) times daily. Take with food, Disp: 180 tablet, Rfl: 1   Dulaglutide (TRULICITY) 3.29 JJ/8.8CZ SOPN, INJECT 0.75 MG INTO THE SKIN ONCE A WEEK., Disp: 6 mL, Rfl: 1   fexofenadine (ALLEGRA) 180 MG tablet, Take 180 mg by mouth in the morning., Disp: , Rfl:    fluconazole (DIFLUCAN) 150 MG tablet, Take 150 mg by mouth once a week. And prn, Disp: , Rfl:    glipiZIDE (GLUCOTROL) 10 MG tablet, Take 1 tablet (10 mg total) by mouth daily before breakfast., Disp: 90 tablet, Rfl: 3   levonorgestrel (LILETTA) 19.5 MCG/DAY IUD IUD, by Intrauterine route., Disp: , Rfl:    loratadine (CLARITIN) 10 MG tablet, Take 10 mg by mouth daily., Disp: , Rfl:    meclizine  (ANTIVERT) 25 MG tablet, Take 1 tablet (25 mg total) by mouth 3 (three) times daily as needed for dizziness., Disp: 30 tablet, Rfl: 2   metFORMIN (GLUCOPHAGE) 1000 MG tablet, TAKE 1 TABLET BY MOUTH 2 TIMES DAILY WITH A MEAL., Disp: 180 tablet, Rfl: 3   methylPREDNISolone (MEDROL DOSEPAK) 4 MG TBPK tablet, Take as directed, Disp: 21 each, Rfl: 0   metoCLOPramide (REGLAN) 5 MG/ML injection, Inject 10 mg into the vein daily as needed (migraine). , Disp: , Rfl:    Multiple Vitamin (MULTIVITAMIN) capsule, Take 1 capsule by mouth daily., Disp: , Rfl:    naproxen (NAPROSYN) 500 MG tablet, Take 1 tablet (500 mg total) by mouth 2 (two) times daily as needed., Disp: 180 tablet, Rfl: 1   nitroGLYCERIN (NITROSTAT) 0.4 MG SL tablet, PLACE 1  TABLET UNDER THE TONGUE EVERY 5 MINUTES AS NEEDED FOR CHEST PAIN., Disp: 150 tablet, Rfl: 1   omeprazole (PRILOSEC) 20 MG capsule, TAKE 1 CAPSULE BY MOUTH 2 TIMES DAILY BEFORE A MEAL., Disp: 180 capsule, Rfl: 3   ondansetron (ZOFRAN ODT) 4 MG disintegrating tablet, Take 1 tablet (4 mg total) by mouth every 8 (eight) hours as needed for nausea or vomiting., Disp: 20 tablet, Rfl: 0   oxyCODONE (OXY IR/ROXICODONE) 5 MG immediate release tablet, Take 1 tablet (5 mg total) by mouth every 4 (four) hours as needed for severe pain., Disp: 20 tablet, Rfl: 0   pantoprazole (PROTONIX) 40 MG tablet, Take 40 mg by mouth daily., Disp: , Rfl:    prochlorperazine (COMPAZINE) 10 MG tablet, Take 1 tablet (10 mg total) by mouth every 6 (six) hours as needed for nausea or vomiting (migraine)., Disp: 15 tablet, Rfl: 0   promethazine (PHENERGAN) 25 MG tablet, Take 1 tablet (25 mg total) by mouth every 6 (six) hours as needed for nausea or vomiting., Disp: 35 tablet, Rfl: 0   RESTASIS 0.05 % ophthalmic emulsion, Place 1 drop into both eyes 2 (two) times daily. , Disp: , Rfl:    sucralfate (CARAFATE) 1 g tablet, Take 1 tablet (1 g total) by mouth 4 (four) times daily -  before meals and at bedtime.,  Disp: 120 tablet, Rfl: 11   traMADol (ULTRAM) 50 MG tablet, Take 50 mg by mouth every 6 (six) hours as needed., Disp: , Rfl:   Observations/Objective: Patient is well-developed, well-nourished in no acute distress.  Resting comfortably at home.  Head is normocephalic, atraumatic.  No labored breathing. Speech is clear and coherent with logical content.  Patient is alert and oriented at baseline.   Assessment and Plan: 1. Acute bacterial sinusitis - benzonatate (TESSALON) 100 MG capsule; Take 1 capsule (100 mg total) by mouth 3 (three) times daily as needed for cough.  Dispense: 30 capsule; Refill: 0 - doxycycline (VIBRA-TABS) 100 MG tablet; Take 1 tablet (100 mg total) by mouth 2 (two) times daily.  Dispense: 20 tablet; Refill: 0  Rx Doxycycline 100 mg BID x 10 days.  She is to hold her 20 mg dose of Doxycycline while on this regimen. Increase fluids.  Rest.  Saline nasal spray.  Probiotic.  Mucinex as directed.  Humidifier in bedroom. Tessalon per orders.  Call or return to clinic if symptoms are not improving.   Follow Up Instructions: I discussed the assessment and treatment plan with the patient. The patient was provided an opportunity to ask questions and all were answered. The patient agreed with the plan and demonstrated an understanding of the instructions.  A copy of instructions were sent to the patient via MyChart unless otherwise noted below.   The patient was advised to call back or seek an in-person evaluation if the symptoms worsen or if the condition fails to improve as anticipated.  Time:  I spent 10 minutes with the patient via telehealth technology discussing the above problems/concerns.    Leeanne Rio, PA-C

## 2021-08-21 ENCOUNTER — Telehealth: Payer: 59 | Admitting: Physician Assistant

## 2021-08-21 DIAGNOSIS — J019 Acute sinusitis, unspecified: Secondary | ICD-10-CM | POA: Diagnosis not present

## 2021-08-21 DIAGNOSIS — B9689 Other specified bacterial agents as the cause of diseases classified elsewhere: Secondary | ICD-10-CM | POA: Diagnosis not present

## 2021-08-21 MED ORDER — PREDNISONE 10 MG (21) PO TBPK
ORAL_TABLET | ORAL | 0 refills | Status: DC
Start: 2021-08-21 — End: 2022-07-05

## 2021-08-21 MED ORDER — CLINDAMYCIN HCL 300 MG PO CAPS: 300.0000 mg | ORAL_CAPSULE | Freq: Three times a day (TID) | ORAL | 0 refills | Status: DC

## 2021-08-21 NOTE — Patient Instructions (Signed)
[name], thank you for joining Mar Daring, PA-C for today's virtual visit.  While this provider is not your primary care provider (PCP), if your PCP is located in our provider database this encounter information will be shared with them immediately following your visit.  Consent: (Patient) Judith Fox provided verbal consent for this virtual visit at the beginning of the encounter.  Current Medications:  Current Outpatient Medications:    clindamycin (CLEOCIN) 300 MG capsule, Take 1 capsule (300 mg total) by mouth 3 (three) times daily., Disp: 21 capsule, Rfl: 0   predniSONE (STERAPRED UNI-PAK 21 TAB) 10 MG (21) TBPK tablet, 6 day taper; take as directed on package instructions, Disp: 21 tablet, Rfl: 0   albuterol (VENTOLIN HFA) 108 (90 Base) MCG/ACT inhaler, Inhale 2 puffs into the lungs every 6 (six) hours as needed for wheezing or shortness of breath., Disp: 8 g, Rfl: 2   aspirin EC 81 MG tablet, Take 81 mg by mouth daily., Disp: , Rfl:    atorvastatin (LIPITOR) 40 MG tablet, Take 1 tablet (40 mg total) by mouth daily., Disp: 90 tablet, Rfl: 3   benazepril (LOTENSIN) 40 MG tablet, Take 1 tablet (40 mg total) by mouth daily., Disp: 90 tablet, Rfl: 3   benzonatate (TESSALON) 100 MG capsule, Take 1 capsule (100 mg total) by mouth 3 (three) times daily as needed for cough., Disp: 30 capsule, Rfl: 0   CALCIUM-VITAMIN D PO, Take 2 tablets by mouth daily. , Disp: , Rfl:    celecoxib (CELEBREX) 200 MG capsule, Take 1 capsule (200 mg total) by mouth daily., Disp: 90 capsule, Rfl: 1   doxycycline (PERIOSTAT) 20 MG tablet, Take 1 tablet (20 mg total) by mouth 2 (two) times daily. Take with food, Disp: 180 tablet, Rfl: 1   Dulaglutide (TRULICITY) 2.69 SW/5.4OE SOPN, INJECT 0.75 MG INTO THE SKIN ONCE A WEEK., Disp: 6 mL, Rfl: 1   fexofenadine (ALLEGRA) 180 MG tablet, Take 180 mg by mouth in the morning., Disp: , Rfl:    fluconazole (DIFLUCAN) 150 MG tablet, Take 150 mg  by mouth once a week. And prn, Disp: , Rfl:    glipiZIDE (GLUCOTROL) 10 MG tablet, Take 1 tablet (10 mg total) by mouth daily before breakfast., Disp: 90 tablet, Rfl: 3   levonorgestrel (LILETTA) 19.5 MCG/DAY IUD IUD, by Intrauterine route., Disp: , Rfl:    loratadine (CLARITIN) 10 MG tablet, Take 10 mg by mouth daily., Disp: , Rfl:    meclizine (ANTIVERT) 25 MG tablet, Take 1 tablet (25 mg total) by mouth 3 (three) times daily as needed for dizziness., Disp: 30 tablet, Rfl: 2   metFORMIN (GLUCOPHAGE) 1000 MG tablet, TAKE 1 TABLET BY MOUTH 2 TIMES DAILY WITH A MEAL., Disp: 180 tablet, Rfl: 3   metoCLOPramide (REGLAN) 5 MG/ML injection, Inject 10 mg into the vein daily as needed (migraine). , Disp: , Rfl:    Multiple Vitamin (MULTIVITAMIN) capsule, Take 1 capsule by mouth daily., Disp: , Rfl:    naproxen (NAPROSYN) 500 MG tablet, Take 1 tablet (500 mg total) by mouth 2 (two) times daily as needed., Disp: 180 tablet, Rfl: 1   nitroGLYCERIN (NITROSTAT) 0.4 MG SL tablet, PLACE 1 TABLET UNDER THE TONGUE EVERY 5 MINUTES AS NEEDED FOR CHEST PAIN., Disp: 150 tablet, Rfl: 1   omeprazole (PRILOSEC) 20 MG capsule, TAKE 1 CAPSULE BY MOUTH 2 TIMES DAILY BEFORE A MEAL., Disp: 180 capsule, Rfl: 3   ondansetron (ZOFRAN ODT) 4 MG disintegrating tablet, Take  1 tablet (4 mg total) by mouth every 8 (eight) hours as needed for nausea or vomiting., Disp: 20 tablet, Rfl: 0   oxyCODONE (OXY IR/ROXICODONE) 5 MG immediate release tablet, Take 1 tablet (5 mg total) by mouth every 4 (four) hours as needed for severe pain., Disp: 20 tablet, Rfl: 0   pantoprazole (PROTONIX) 40 MG tablet, Take 40 mg by mouth daily., Disp: , Rfl:    prochlorperazine (COMPAZINE) 10 MG tablet, Take 1 tablet (10 mg total) by mouth every 6 (six) hours as needed for nausea or vomiting (migraine)., Disp: 15 tablet, Rfl: 0   promethazine (PHENERGAN) 25 MG tablet, Take 1 tablet (25 mg total) by mouth every 6 (six) hours as needed for nausea or vomiting.,  Disp: 35 tablet, Rfl: 0   RESTASIS 0.05 % ophthalmic emulsion, Place 1 drop into both eyes 2 (two) times daily. , Disp: , Rfl:    sucralfate (CARAFATE) 1 g tablet, Take 1 tablet (1 g total) by mouth 4 (four) times daily -  before meals and at bedtime., Disp: 120 tablet, Rfl: 11   traMADol (ULTRAM) 50 MG tablet, Take 50 mg by mouth every 6 (six) hours as needed., Disp: , Rfl:    Medications ordered in this encounter:  Meds ordered this encounter  Medications   clindamycin (CLEOCIN) 300 MG capsule    Sig: Take 1 capsule (300 mg total) by mouth 3 (three) times daily.    Dispense:  21 capsule    Refill:  0    Order Specific Question:   Supervising Provider    Answer:   MILLER, BRIAN [[id number]]   predniSONE (STERAPRED UNI-PAK 21 TAB) 10 MG (21) TBPK tablet    Sig: 6 day taper; take as directed on package instructions    Dispense:  21 tablet    Refill:  0    Order Specific Question:   Supervising Provider    Answer:   Sabra Heck, [place]     *If you need refills on other medications prior to your next appointment, please contact your pharmacy*  Follow-Up: Call back or seek an in-person evaluation if the symptoms worsen or if the condition fails to improve as anticipated.  Other Instructions Sinusitis, Adult Sinusitis is inflammation of your sinuses. Sinuses are hollow spaces in the bones around your face. Your sinuses are located: Around your eyes. In the middle of your forehead. Behind your nose. In your cheekbones. Mucus normally drains out of your sinuses. When your nasal tissues become inflamed or swollen, mucus can become trapped or blocked. This allows bacteria, viruses, and fungi to grow, which leads to infection. Most infections of the sinuses are caused by a virus. Sinusitis can develop quickly. It can last for up to 4 weeks (acute) or for more than 12 weeks (chronic). Sinusitis often develops after a cold. What are the causes? This condition is caused by anything that creates  swelling in the sinuses or stops mucus from draining. This includes: Allergies. Asthma. Infection from bacteria or viruses. Deformities or blockages in your nose or sinuses. Abnormal growths in the nose (nasal polyps). Pollutants, such as chemicals or irritants in the air. Infection from fungi (rare). What increases the risk? You are more likely to develop this condition if you: Have a weak body defense system (immune system). Do a lot of swimming or diving. Overuse nasal sprays. Smoke. What are the signs or symptoms? The main symptoms of this condition are pain and a feeling of pressure around the affected sinuses.  Other symptoms include: Stuffy nose or congestion. Thick drainage from your nose. Swelling and warmth over the affected sinuses. Headache. Upper toothache. A cough that may get worse at night. Extra mucus that collects in the throat or the back of the nose (postnasal drip). Decreased sense of smell and taste. Fatigue. A fever. Sore throat. Bad breath. How is this diagnosed? This condition is diagnosed based on: Your symptoms. Your medical history. A physical exam. Tests to find out if your condition is acute or chronic. This may include: Checking your nose for nasal polyps. Viewing your sinuses using a device that has a light (endoscope). Testing for allergies or bacteria. Imaging tests, such as an MRI or CT scan. In rare cases, a bone biopsy may be done to rule out more serious types of fungal sinus disease. How is this treated? Treatment for sinusitis depends on the cause and whether your condition is chronic or acute. If caused by a virus, your symptoms should go away on their own within 10 days. You may be given medicines to relieve symptoms. They include: Medicines that shrink swollen nasal passages (topical intranasal decongestants). Medicines that treat allergies (antihistamines). A spray that eases inflammation of the nostrils (topical intranasal  corticosteroids). Rinses that help get rid of thick mucus in your nose (nasal saline washes). If caused by bacteria, your health care provider may recommend waiting to see if your symptoms improve. Most bacterial infections will get better without antibiotic medicine. You may be given antibiotics if you have: A severe infection. A weak immune system. If caused by narrow nasal passages or nasal polyps, you may need to have surgery. Follow these instructions at home: Medicines Take, use, or apply over-the-counter and prescription medicines only as told by your health care provider. These may include nasal sprays. If you were prescribed an antibiotic medicine, take it as told by your health care provider. Do not stop taking the antibiotic even if you start to feel better. Hydrate and humidify  Drink enough fluid to keep your urine pale yellow. Staying hydrated will help to thin your mucus. Use a cool mist humidifier to keep the humidity level in your home above 50%. Inhale steam for 10-15 minutes, 3-4 times a day, or as told by your health care provider. You can do this in the bathroom while a hot shower is running. Limit your exposure to cool or dry air. Rest Rest as much as possible. Sleep with your head raised (elevated). Make sure you get enough sleep each night. General instructions  Apply a warm, moist washcloth to your face 3-4 times a day or as told by your health care provider. This will help with discomfort. Wash your hands often with soap and water to reduce your exposure to germs. If soap and water are not available, use hand sanitizer. Do not smoke. Avoid being around people who are smoking (secondhand smoke). Keep all follow-up visits as told by your health care provider. This is important. Contact a health care provider if: You have a fever. Your symptoms get worse. Your symptoms do not improve within 10 days. Get help right away if: You have a severe headache. You have  persistent vomiting. You have severe pain or swelling around your face or eyes. You have vision problems. You develop confusion. Your neck is stiff. You have trouble breathing. Summary Sinusitis is soreness and inflammation of your sinuses. Sinuses are hollow spaces in the bones around your face. This condition is caused by nasal tissues that become  inflamed or swollen. The swelling traps or blocks the flow of mucus. This allows bacteria, viruses, and fungi to grow, which leads to infection. If you were prescribed an antibiotic medicine, take it as told by your health care provider. Do not stop taking the antibiotic even if you start to feel better. Keep all follow-up visits as told by your health care provider. This is important. This information is not intended to replace advice given to you by your health care provider. Make sure you discuss any questions you have with your health care provider. Document Revised: 12/26/2017 Document Reviewed: 12/26/2017 Elsevier Patient Education  2022 [employer].    If you have been instructed to have an in-person evaluation today at a local Urgent Care facility, please use the link below. It will take you to a list of all of our available [facility], including address, phone number and hours of operation. Please do not delay care.  [facility]  If you or a family member do not have a primary care provider, use the link below to schedule a visit and establish care. When you choose a [facility] primary care physician or advanced practice provider, you gain a long-term partner in health. Find a Primary Care Provider  Learn more about [facility]'s in-office and virtual care options: [facility]

## 2021-08-21 NOTE — Progress Notes (Signed)
Virtual Visit Consent   Judith Fox, you are scheduled for a virtual visit with a [facility] provider today.     Just as with appointments in the office, your consent must be obtained to participate.  Your consent will be active for this visit and any virtual visit you may have with one of our providers in the next 365 days.     If you have a MyChart account, a copy of this consent can be sent to you electronically.  All virtual visits are billed to your insurance company just like a traditional visit in the office.    As this is a virtual visit, video technology does not allow for your provider to perform a traditional examination.  This may limit your provider's ability to fully assess your condition.  If your provider identifies any concerns that need to be evaluated in person or the need to arrange testing (such as labs, EKG, etc.), we will make arrangements to do so.     Although advances in technology are sophisticated, we cannot ensure that it will always work on either your end or our end.  If the connection with a video visit is poor, the visit may have to be switched to a telephone visit.  With either a video or telephone visit, we are not always able to ensure that we have a secure connection.     I need to obtain your verbal consent now.   Are you willing to proceed with your visit today?    Judith Fox has provided verbal consent on 08/21/2021 for a virtual visit (video or telephone).   Mar Daring, PA-C   Date: 08/21/2021 12:24 PM   Virtual Visit via Video Note   I, Mar Daring, connected with  Judith Fox  ([id number], [birth year]) on 08/21/21 at 12:15 PM EST by a video-enabled telemedicine application and verified that I am speaking with the correct person using two identifiers.  Location: Patient: Virtual Visit Location Patient: Home Provider: Virtual Visit Location Provider: Home Office   I discussed the limitations of  evaluation and management by telemedicine and the availability of in person appointments. The patient expressed understanding and agreed to proceed.    History of Present Illness: Judith Fox is a 56 y.o. who identifies as a female who was assigned female at birth, and is being seen today for continued sinus symptoms. Seen virtually on 08/13/21 and prescribed Doxycycline for sinus infection secondary to influenza. Still having sinus pressure, congestion, sinus pain, cough, post nasal drainage, sore throat from cough, bad taste to mucous.    Problems:  Patient Active Problem List   Diagnosis Date Noted   Melanoma ([place]) 02/17/2021   Generalized weakness 01/23/2020   Hypomagnesemia 01/23/2020   Intestinal infection due to bacteria causing bloody diarrhea 01/23/2020   IUD (intrauterine device) in place 10/25/2019   Cyst of left ovary 10/25/2019   Acute left-sided low back pain without sciatica 10/24/2019   Left lower quadrant abdominal pain 10/23/2019   Varicose veins of both lower extremities with pain 05/16/2018   Bilateral lower extremity edema 05/16/2018   Mixed hyperlipidemia 09/05/2017   Anxiety 06/26/2014   Syncope and collapse 07/30/2013   Palpitations 03/11/2013   Type 2 diabetes mellitus without complication ([place]) 32/99/2426   History of non-ST elevation myocardial infarction (NSTEMI) 03/01/2013   HTN (hypertension) 03/01/2013   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   Chronic pain syndrome 12/27/2011   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  Chronic migraine without aura 10/12/2011   Migraine without aura 10/12/2011    Allergies:  Allergies  Allergen Reactions   Latex Rash and Swelling    Eye swelling    Butorphanol Other (See Comments)    drowsiness Somnolence Pt "knocked out for 12 hrs" when taking this    Lactose Intolerance (Gi) Other (See Comments)    Reflux and Indigestion   Medications:  Current Outpatient Medications:    albuterol (VENTOLIN  HFA) 108 (90 Base) MCG/ACT inhaler, Inhale 2 puffs into the lungs every 6 (six) hours as needed for wheezing or shortness of breath., Disp: 8 g, Rfl: 2   aspirin EC 81 MG tablet, Take 81 mg by mouth daily., Disp: , Rfl:    atorvastatin (LIPITOR) 40 MG tablet, Take 1 tablet (40 mg total) by mouth daily., Disp: 90 tablet, Rfl: 3   benazepril (LOTENSIN) 40 MG tablet, Take 1 tablet (40 mg total) by mouth daily., Disp: 90 tablet, Rfl: 3   benzonatate (TESSALON) 100 MG capsule, Take 1 capsule (100 mg total) by mouth 3 (three) times daily as needed for cough., Disp: 30 capsule, Rfl: 0   CALCIUM-VITAMIN D PO, Take 2 tablets by mouth daily. , Disp: , Rfl:    celecoxib (CELEBREX) 200 MG capsule, Take 1 capsule (200 mg total) by mouth daily., Disp: 90 capsule, Rfl: 1   doxycycline (PERIOSTAT) 20 MG tablet, Take 1 tablet (20 mg total) by mouth 2 (two) times daily. Take with food, Disp: 180 tablet, Rfl: 1   doxycycline (VIBRA-TABS) 100 MG tablet, Take 1 tablet (100 mg total) by mouth 2 (two) times daily., Disp: 20 tablet, Rfl: 0   Dulaglutide (TRULICITY) 6.06 TK/1.6WF SOPN, INJECT 0.75 MG INTO THE SKIN ONCE A WEEK., Disp: 6 mL, Rfl: 1   fexofenadine (ALLEGRA) 180 MG tablet, Take 180 mg by mouth in the morning., Disp: , Rfl:    fluconazole (DIFLUCAN) 150 MG tablet, Take 150 mg by mouth once a week. And prn, Disp: , Rfl:    glipiZIDE (GLUCOTROL) 10 MG tablet, Take 1 tablet (10 mg total) by mouth daily before breakfast., Disp: 90 tablet, Rfl: 3   levonorgestrel (LILETTA) 19.5 MCG/DAY IUD IUD, by Intrauterine route., Disp: , Rfl:    loratadine (CLARITIN) 10 MG tablet, Take 10 mg by mouth daily., Disp: , Rfl:    meclizine (ANTIVERT) 25 MG tablet, Take 1 tablet (25 mg total) by mouth 3 (three) times daily as needed for dizziness., Disp: 30 tablet, Rfl: 2   metFORMIN (GLUCOPHAGE) 1000 MG tablet, TAKE 1 TABLET BY MOUTH 2 TIMES DAILY WITH A MEAL., Disp: 180 tablet, Rfl: 3   methylPREDNISolone (MEDROL DOSEPAK) 4 MG TBPK  tablet, Take as directed, Disp: 21 each, Rfl: 0   metoCLOPramide (REGLAN) 5 MG/ML injection, Inject 10 mg into the vein daily as needed (migraine). , Disp: , Rfl:    Multiple Vitamin (MULTIVITAMIN) capsule, Take 1 capsule by mouth daily., Disp: , Rfl:    naproxen (NAPROSYN) 500 MG tablet, Take 1 tablet (500 mg total) by mouth 2 (two) times daily as needed., Disp: 180 tablet, Rfl: 1   nitroGLYCERIN (NITROSTAT) 0.4 MG SL tablet, PLACE 1 TABLET UNDER THE TONGUE EVERY 5 MINUTES AS NEEDED FOR CHEST PAIN., Disp: 150 tablet, Rfl: 1   omeprazole (PRILOSEC) 20 MG capsule, TAKE 1 CAPSULE BY MOUTH 2 TIMES DAILY BEFORE A MEAL., Disp: 180 capsule, Rfl: 3   ondansetron (ZOFRAN ODT) 4 MG disintegrating tablet, Take 1 tablet (4 mg total) by mouth  every 8 (eight) hours as needed for nausea or vomiting., Disp: 20 tablet, Rfl: 0   oxyCODONE (OXY IR/ROXICODONE) 5 MG immediate release tablet, Take 1 tablet (5 mg total) by mouth every 4 (four) hours as needed for severe pain., Disp: 20 tablet, Rfl: 0   pantoprazole (PROTONIX) 40 MG tablet, Take 40 mg by mouth daily., Disp: , Rfl:    prochlorperazine (COMPAZINE) 10 MG tablet, Take 1 tablet (10 mg total) by mouth every 6 (six) hours as needed for nausea or vomiting (migraine)., Disp: 15 tablet, Rfl: 0   promethazine (PHENERGAN) 25 MG tablet, Take 1 tablet (25 mg total) by mouth every 6 (six) hours as needed for nausea or vomiting., Disp: 35 tablet, Rfl: 0   RESTASIS 0.05 % ophthalmic emulsion, Place 1 drop into both eyes 2 (two) times daily. , Disp: , Rfl:    sucralfate (CARAFATE) 1 g tablet, Take 1 tablet (1 g total) by mouth 4 (four) times daily -  before meals and at bedtime., Disp: 120 tablet, Rfl: 11   traMADol (ULTRAM) 50 MG tablet, Take 50 mg by mouth every 6 (six) hours as needed., Disp: , Rfl:   Observations/Objective: Patient is well-developed, well-nourished in no acute distress.  Resting comfortably at home.  Head is normocephalic, atraumatic.  No labored  breathing.  Speech is clear and coherent with logical content.  Patient is alert and oriented at baseline.    Assessment and Plan: 1. Acute bacterial sinusitis  - Worsening symptoms that have not responded to OTC medications and failed Doxycycline - Will give Clindamycin and prednisone - Steam and humidifier can help - Continue allergy medications.  - Stay well hydrated and get plenty of rest. - Seek in person evaluation if no symptom improvement or if symptoms worsen.  Follow Up Instructions: I discussed the assessment and treatment plan with the patient. The patient was provided an opportunity to ask questions and all were answered. The patient agreed with the plan and demonstrated an understanding of the instructions.  A copy of instructions were sent to the patient via MyChart unless otherwise noted below.   The patient was advised to call back or seek an in-person evaluation if the symptoms worsen or if the condition fails to improve as anticipated.  Time:  I spent 14 minutes with the patient via telehealth technology discussing the above problems/concerns.    Mar Daring, PA-C

## 2021-08-24 ENCOUNTER — Other Ambulatory Visit: Payer: Self-pay | Admitting: Family Medicine

## 2021-08-24 DIAGNOSIS — E119 Type 2 diabetes mellitus without complications: Secondary | ICD-10-CM

## 2021-09-05 ENCOUNTER — Other Ambulatory Visit: Payer: Self-pay | Admitting: Podiatry

## 2021-09-05 DIAGNOSIS — L719 Rosacea, unspecified: Secondary | ICD-10-CM

## 2021-09-07 ENCOUNTER — Encounter: Payer: 59 | Admitting: Family Medicine

## 2021-09-07 ENCOUNTER — Other Ambulatory Visit: Payer: Self-pay | Admitting: Family Medicine

## 2021-09-07 DIAGNOSIS — L719 Rosacea, unspecified: Secondary | ICD-10-CM

## 2021-09-07 NOTE — Telephone Encounter (Signed)
Medication Refill - Medication: doxycycline (PERIOSTAT) 20 MG tablet  Has the patient contacted their pharmacy? Yes.   (Agent: If no, request that the patient contact the pharmacy for the refill. If patient does not wish to contact the pharmacy document the reason why and proceed with request.) (Agent: If yes, when and what did the pharmacy advise?)  Preferred Pharmacy (with phone number or street name):  [id number] [place] [address] Phone:  [phone]  Fax:  [phone]     Has the patient been seen for an appointment in the last year OR does the patient have an upcoming appointment? Yes.    Agent: Please be advised that RX refills may take up to 3 business days. We ask that you follow-up with your pharmacy.   PATIENT STATED SHE TAKES THIS MEDICATION FOR ROSACEA.

## 2021-09-07 NOTE — Telephone Encounter (Signed)
Requested medication (s) are due for refill today: yes  Requested medication (s) are on the active medication list: yes  Last refill:  02/12/21 #180/1  Future visit scheduled: yes  Notes to clinic:  Unable to refill per protocol, last refill by another provider.      Requested Prescriptions  Pending Prescriptions Disp Refills   doxycycline (PERIOSTAT) 20 MG tablet 180 tablet 1    Sig: Take 1 tablet (20 mg total) by mouth 2 (two) times daily. Take with food     Off-Protocol Failed - 09/07/2021 11:53 AM      Failed - Medication not assigned to a protocol, review manually.      Passed - Valid encounter within last 12 months    Recent Outpatient Visits           4 months ago Type 2 diabetes mellitus without complication, without long-term current use of insulin [facility])   [facility] Jerrol Banana., MD   8 months ago Type 2 diabetes mellitus without complication, without long-term current use of insulin [facility]   [facility] Jerrol Banana., MD   11 months ago [place] Jerrol Banana., MD   11 months ago Sinusitis, unspecified chronicity, unspecified location   [facility] Jerrol Banana., MD   1 year ago Subacute pansinusitis   [facility] Jerrol Banana., MD       Future Appointments             In 2 months Jerrol Banana., MD [facility] [place]

## 2021-09-21 DIAGNOSIS — J0111 Acute recurrent frontal sinusitis: Secondary | ICD-10-CM | POA: Diagnosis not present

## 2021-09-21 DIAGNOSIS — E782 Mixed hyperlipidemia: Secondary | ICD-10-CM | POA: Diagnosis not present

## 2021-09-21 DIAGNOSIS — E119 Type 2 diabetes mellitus without complications: Secondary | ICD-10-CM | POA: Diagnosis not present

## 2021-09-21 DIAGNOSIS — I10 Essential (primary) hypertension: Secondary | ICD-10-CM | POA: Diagnosis not present

## 2021-09-21 DIAGNOSIS — Z0000 Encounter for general adult medical examination without abnormal findings: Secondary | ICD-10-CM | POA: Diagnosis not present

## 2021-09-25 ENCOUNTER — Other Ambulatory Visit: Payer: Self-pay

## 2021-09-25 ENCOUNTER — Ambulatory Visit
Admission: RE | Admit: 2021-09-25 | Discharge: 2021-09-25 | Disposition: A | Discharge status: Home / Self Care | Payer: 59 | Source: Ambulatory Visit | Attending: Obstetrics and Gynecology | Admitting: Obstetrics and Gynecology

## 2021-09-25 DIAGNOSIS — Z1231 Encounter for screening mammogram for malignant neoplasm of breast: Secondary | ICD-10-CM | POA: Diagnosis not present

## 2021-09-25 IMAGING — MG MM DIGITAL SCREENING BILAT W/ TOMO AND CAD
6 of 10 series · 6 of 30 positions shown · non-contrast
Comparison: Previous exam(s).

CLINICAL DATA: Screening.

EXAM:
DIGITAL SCREENING BILATERAL MAMMOGRAM WITH TOMOSYNTHESIS AND CAD
TECHNIQUE: Bilateral screening digital craniocaudal and mediolateral oblique
mammograms were obtained. Bilateral screening digital breast
tomosynthesis was performed. The images were evaluated with
computer-aided detection.

[R CC synth-2D (1 of 2)]
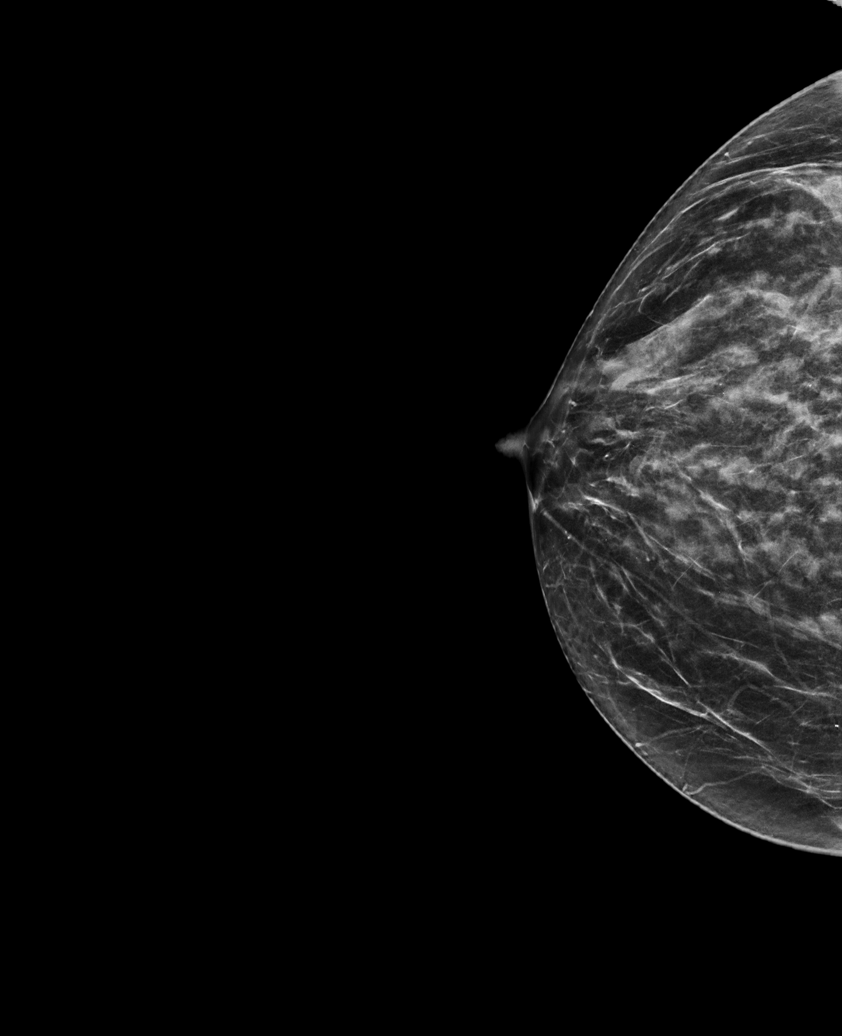

[R MLO synth-2D]
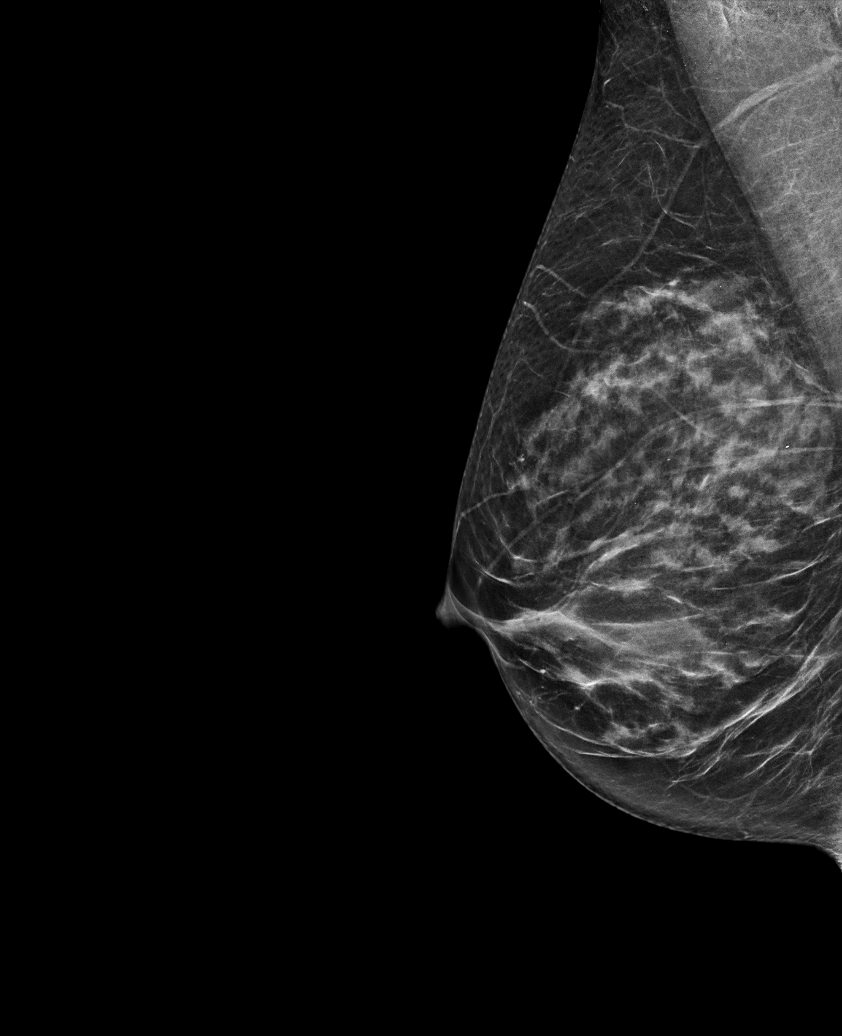

[R CC synth-2D (2 of 2)]
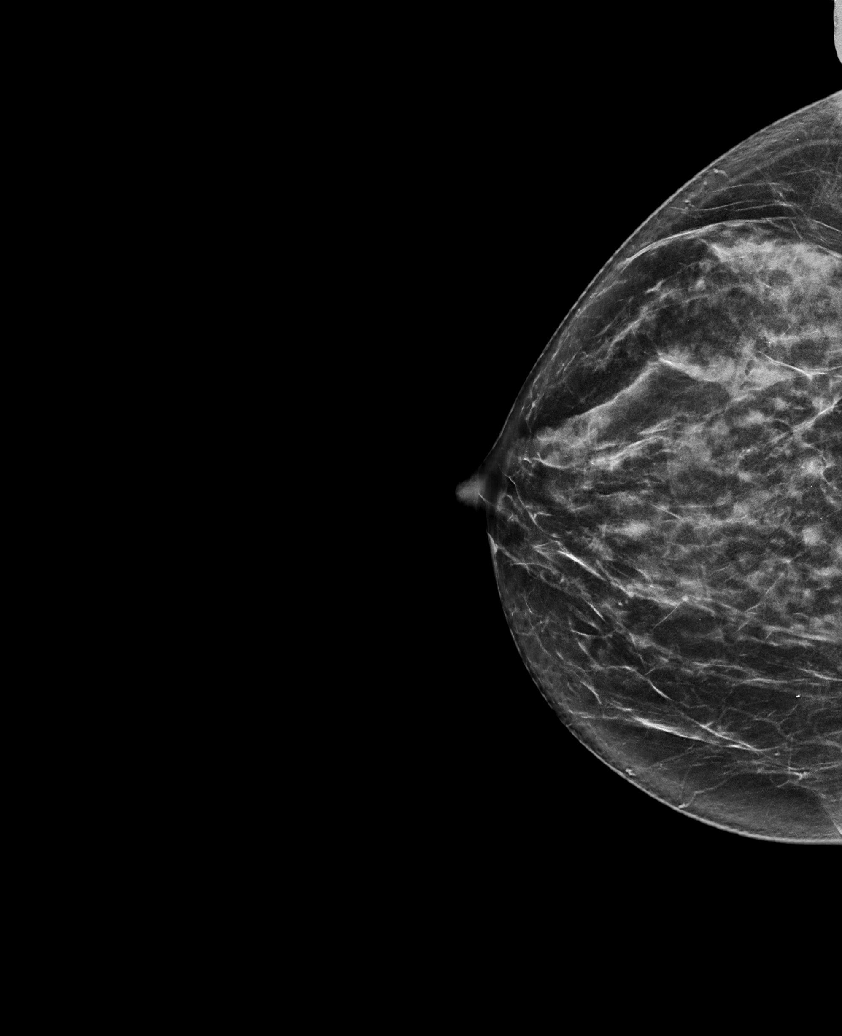

[L CC synth-2D]
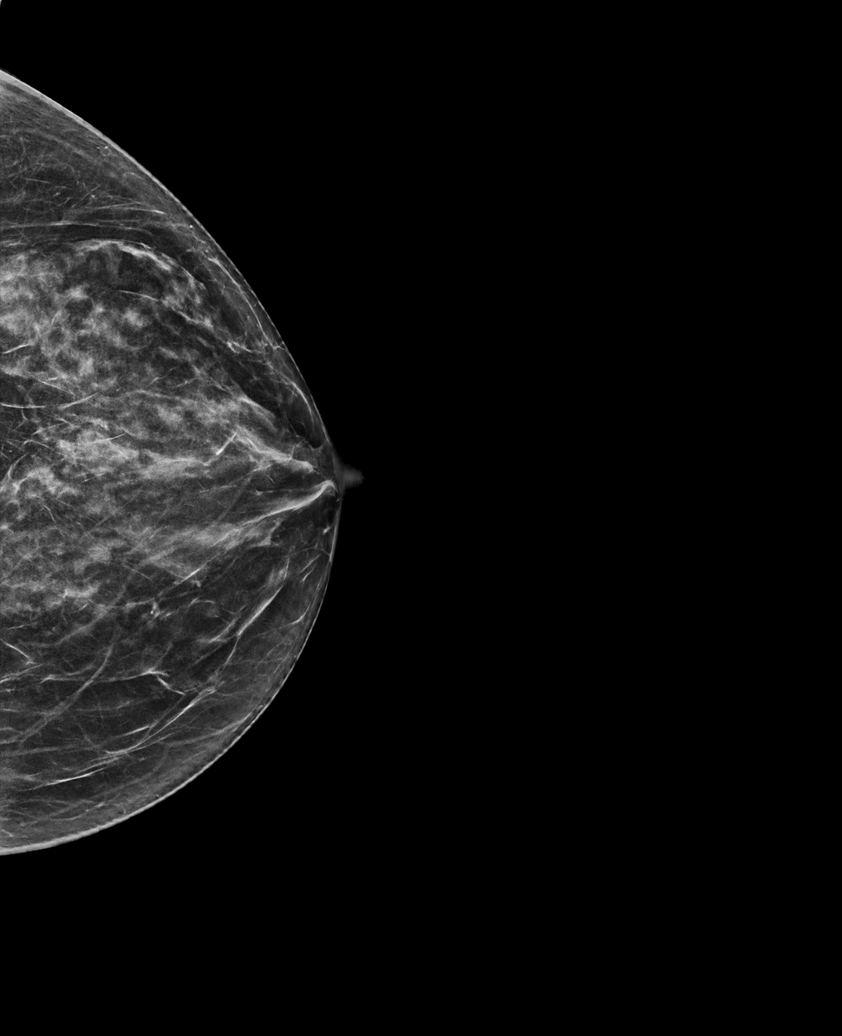

[L MLO synth-2D]
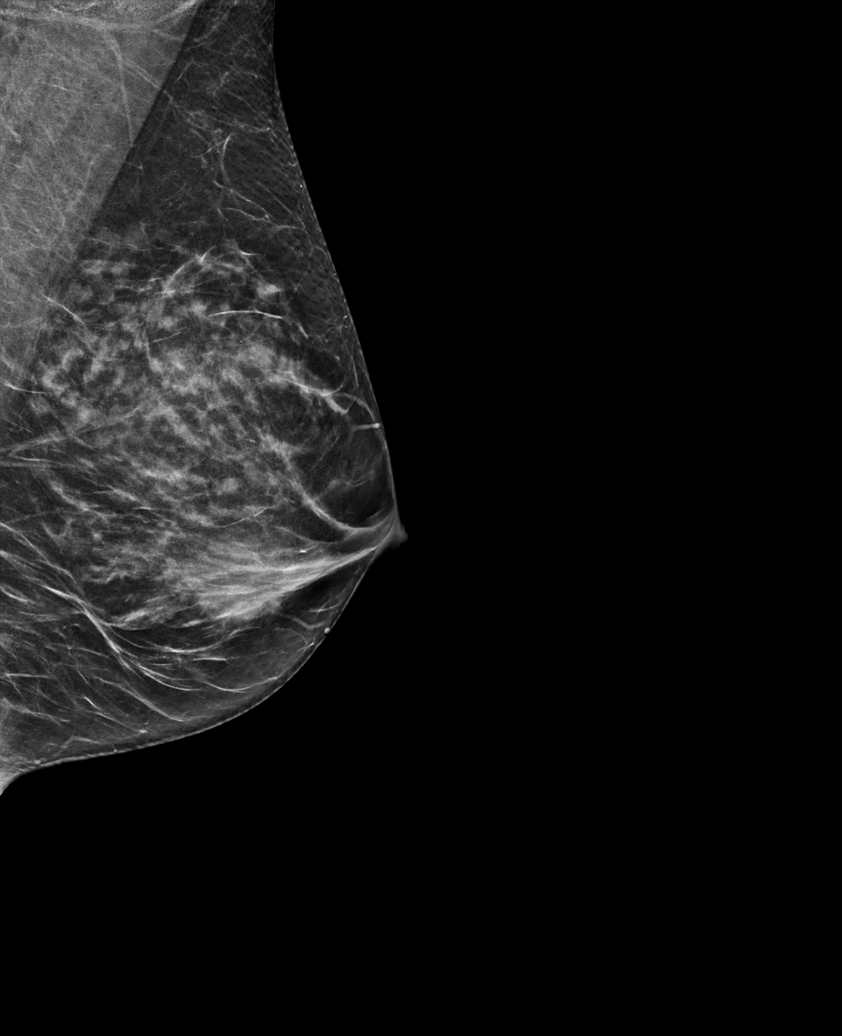

[L CC tomo · tomo slice 31/61.0]
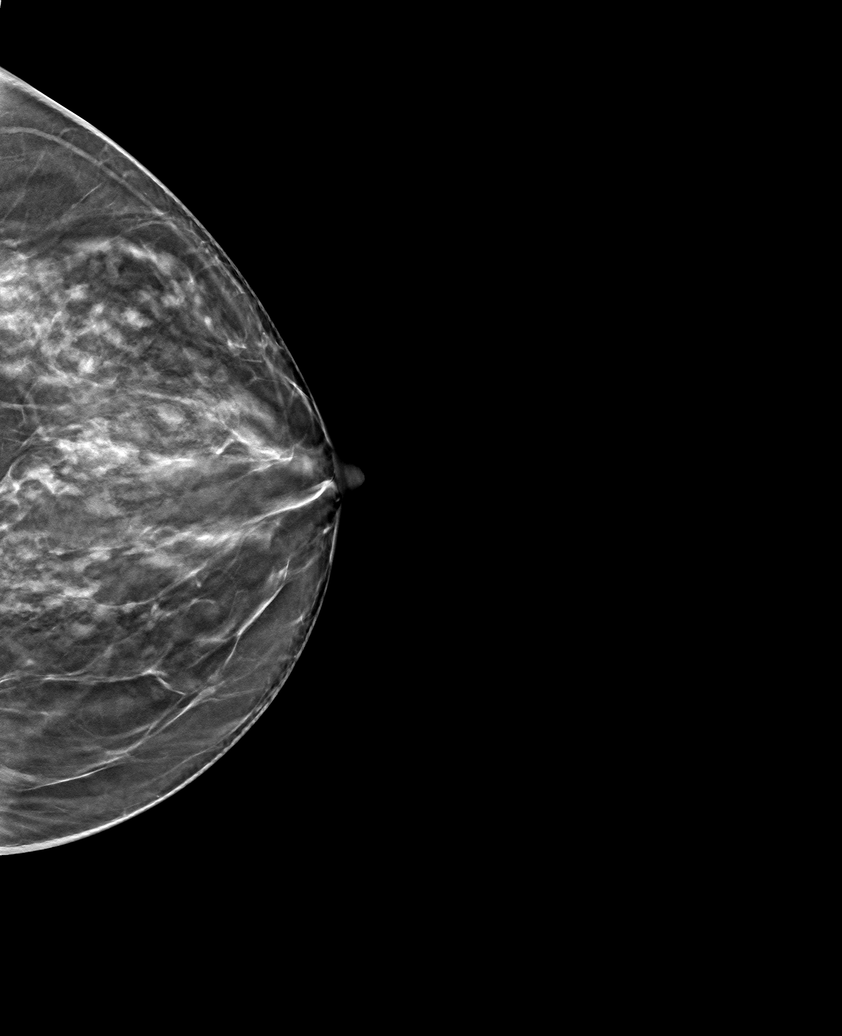

[6 of 30 positions shown; findings below may reference images not displayed]

ACR Breast Density Category c: The breast tissue is heterogeneously
dense, which may obscure small masses.
FINDINGS: There are no findings suspicious for malignancy.
IMPRESSION: No mammographic evidence of malignancy. A result letter of this
screening mammogram will be mailed directly to the patient.

RECOMMENDATION:
Screening mammogram in one year. (Code:[V2])

BI-RADS CATEGORY  1: Negative.

## 2021-09-29 DIAGNOSIS — J40 Bronchitis, not specified as acute or chronic: Secondary | ICD-10-CM | POA: Diagnosis not present

## 2021-09-29 DIAGNOSIS — J019 Acute sinusitis, unspecified: Secondary | ICD-10-CM | POA: Diagnosis not present

## 2021-10-13 DIAGNOSIS — E782 Mixed hyperlipidemia: Secondary | ICD-10-CM | POA: Diagnosis not present

## 2021-10-13 DIAGNOSIS — I10 Essential (primary) hypertension: Secondary | ICD-10-CM | POA: Diagnosis not present

## 2021-10-13 DIAGNOSIS — J320 Chronic maxillary sinusitis: Secondary | ICD-10-CM | POA: Diagnosis not present

## 2021-10-13 DIAGNOSIS — E119 Type 2 diabetes mellitus without complications: Secondary | ICD-10-CM | POA: Diagnosis not present

## 2021-10-15 DIAGNOSIS — J309 Allergic rhinitis, unspecified: Secondary | ICD-10-CM | POA: Diagnosis not present

## 2021-10-15 DIAGNOSIS — J329 Chronic sinusitis, unspecified: Secondary | ICD-10-CM | POA: Diagnosis not present

## 2021-10-15 DIAGNOSIS — J301 Allergic rhinitis due to pollen: Secondary | ICD-10-CM | POA: Diagnosis not present

## 2021-10-16 DIAGNOSIS — J320 Chronic maxillary sinusitis: Secondary | ICD-10-CM | POA: Diagnosis not present

## 2021-11-03 ENCOUNTER — Other Ambulatory Visit: Payer: Self-pay | Admitting: Family Medicine

## 2021-11-03 DIAGNOSIS — E119 Type 2 diabetes mellitus without complications: Secondary | ICD-10-CM

## 2021-11-11 ENCOUNTER — Ambulatory Visit: Payer: Self-pay | Admitting: Family Medicine

## 2021-11-25 ENCOUNTER — Other Ambulatory Visit: Payer: Self-pay | Admitting: Dermatology

## 2021-11-25 ENCOUNTER — Ambulatory Visit: Payer: Self-pay | Admitting: Family Medicine

## 2021-11-25 ENCOUNTER — Other Ambulatory Visit: Payer: Self-pay | Admitting: Family Medicine

## 2021-11-25 DIAGNOSIS — E119 Type 2 diabetes mellitus without complications: Secondary | ICD-10-CM

## 2021-11-25 DIAGNOSIS — L719 Rosacea, unspecified: Secondary | ICD-10-CM

## 2021-11-25 NOTE — Telephone Encounter (Signed)
I called and left a voicemail for her to call [facility] and schedule an appt.   On 09/21/2021 it looks like she has established care with another provider possibly.   ? ?Sent Glipizide 10 mg refill request to [facility] for provider review.    ?

## 2021-12-30 DIAGNOSIS — M545 Low back pain, unspecified: Secondary | ICD-10-CM | POA: Diagnosis not present

## 2021-12-30 DIAGNOSIS — G8929 Other chronic pain: Secondary | ICD-10-CM | POA: Diagnosis not present

## 2021-12-30 DIAGNOSIS — M25552 Pain in left hip: Secondary | ICD-10-CM | POA: Diagnosis not present

## 2021-12-30 DIAGNOSIS — E1169 Type 2 diabetes mellitus with other specified complication: Secondary | ICD-10-CM | POA: Diagnosis not present

## 2021-12-30 DIAGNOSIS — M5442 Lumbago with sciatica, left side: Secondary | ICD-10-CM | POA: Diagnosis not present

## 2022-01-03 ENCOUNTER — Other Ambulatory Visit: Payer: Self-pay | Admitting: Family Medicine

## 2022-01-03 DIAGNOSIS — E119 Type 2 diabetes mellitus without complications: Secondary | ICD-10-CM

## 2022-01-13 DIAGNOSIS — I10 Essential (primary) hypertension: Secondary | ICD-10-CM | POA: Diagnosis not present

## 2022-01-13 DIAGNOSIS — Z780 Asymptomatic menopausal state: Secondary | ICD-10-CM | POA: Diagnosis not present

## 2022-01-13 DIAGNOSIS — E782 Mixed hyperlipidemia: Secondary | ICD-10-CM | POA: Diagnosis not present

## 2022-01-13 DIAGNOSIS — E119 Type 2 diabetes mellitus without complications: Secondary | ICD-10-CM | POA: Diagnosis not present

## 2022-01-19 DIAGNOSIS — Z780 Asymptomatic menopausal state: Secondary | ICD-10-CM | POA: Diagnosis not present

## 2022-01-19 DIAGNOSIS — E119 Type 2 diabetes mellitus without complications: Secondary | ICD-10-CM | POA: Diagnosis not present

## 2022-01-19 DIAGNOSIS — E782 Mixed hyperlipidemia: Secondary | ICD-10-CM | POA: Diagnosis not present

## 2022-01-19 DIAGNOSIS — I10 Essential (primary) hypertension: Secondary | ICD-10-CM | POA: Diagnosis not present

## 2022-02-09 ENCOUNTER — Other Ambulatory Visit: Payer: Self-pay | Admitting: Family Medicine

## 2022-02-09 DIAGNOSIS — K219 Gastro-esophageal reflux disease without esophagitis: Secondary | ICD-10-CM

## 2022-02-10 ENCOUNTER — Other Ambulatory Visit: Payer: Self-pay | Admitting: Family Medicine

## 2022-02-10 DIAGNOSIS — K219 Gastro-esophageal reflux disease without esophagitis: Secondary | ICD-10-CM

## 2022-02-18 ENCOUNTER — Encounter: Payer: 59 | Admitting: Dermatology

## 2022-03-08 DIAGNOSIS — S00262A Insect bite (nonvenomous) of left eyelid and periocular area, initial encounter: Secondary | ICD-10-CM | POA: Diagnosis not present

## 2022-03-17 ENCOUNTER — Other Ambulatory Visit: Payer: Self-pay | Admitting: Family Medicine

## 2022-03-17 ENCOUNTER — Other Ambulatory Visit: Payer: Self-pay | Admitting: Dermatology

## 2022-03-17 DIAGNOSIS — E119 Type 2 diabetes mellitus without complications: Secondary | ICD-10-CM

## 2022-03-17 DIAGNOSIS — L719 Rosacea, unspecified: Secondary | ICD-10-CM

## 2022-03-25 ENCOUNTER — Other Ambulatory Visit: Payer: Self-pay | Admitting: Family Medicine

## 2022-03-25 DIAGNOSIS — E119 Type 2 diabetes mellitus without complications: Secondary | ICD-10-CM

## 2022-04-08 DIAGNOSIS — M222X1 Patellofemoral disorders, right knee: Secondary | ICD-10-CM | POA: Diagnosis not present

## 2022-04-09 ENCOUNTER — Other Ambulatory Visit: Payer: Self-pay | Admitting: Dermatology

## 2022-04-09 ENCOUNTER — Other Ambulatory Visit: Payer: Self-pay | Admitting: Family Medicine

## 2022-04-09 DIAGNOSIS — K219 Gastro-esophageal reflux disease without esophagitis: Secondary | ICD-10-CM

## 2022-04-09 DIAGNOSIS — R7309 Other abnormal glucose: Secondary | ICD-10-CM | POA: Diagnosis not present

## 2022-04-09 DIAGNOSIS — R899 Unspecified abnormal finding in specimens from other organs, systems and tissues: Secondary | ICD-10-CM | POA: Diagnosis not present

## 2022-04-09 DIAGNOSIS — L719 Rosacea, unspecified: Secondary | ICD-10-CM

## 2022-04-09 DIAGNOSIS — E119 Type 2 diabetes mellitus without complications: Secondary | ICD-10-CM

## 2022-04-13 NOTE — Telephone Encounter (Signed)
Requested Prescriptions  Pending Prescriptions Disp Refills  . glipiZIDE (GLUCOTROL) 10 MG tablet [Pharmacy Med Name: GLIPIZIDE 10 MG TABLET] 30 tablet 0    Sig: TAKE 1 TABLET BY MOUTH EVERY DAY BEFORE BREAKFAST     Endocrinology:  Diabetes - Sulfonylureas Failed - 04/09/2022 10:49 AM      Failed - HBA1C is between 0 and 7.9 and within 180 days    Hgb A1c MFr Bld  Date Value Ref Range Status  05/04/2021 7.3 (H) 4.8 - 5.6 % Final    Comment:             Prediabetes: 5.7 - 6.4          Diabetes: >6.4          Glycemic control for adults with diabetes: <7.0          Failed - Valid encounter within last 6 months    Recent Outpatient Visits          11 months ago Type 2 diabetes mellitus without complication, without long-term current use of insulin ([place])   [facility] Jerrol Banana., MD   1 year ago Type 2 diabetes mellitus without complication, without long-term current use of insulin ([place])   [facility] Jerrol Banana., MD   1 year ago COVID   [facility] Jerrol Banana., MD   1 year ago Sinusitis, unspecified chronicity, unspecified location   [facility] Jerrol Banana., MD   1 year ago Subacute pansinusitis   [facility] Jerrol Banana., MD             Passed - Cr in normal range and within 360 days    Creatinine, Ser  Date Value Ref Range Status  05/04/2021 0.94 0.57 - 1.00 mg/dL Final         . sucralfate (CARAFATE) 1 g tablet [Pharmacy Med Name: SUCRALFATE 1 GM TABLET] 120 tablet 2    Sig: TAKE 1 TABLET (1 G TOTAL) BY MOUTH 4 (FOUR) TIMES DAILY - BEFORE MEALS AND AT BEDTIME.     Gastroenterology: Antiacids Passed - 04/09/2022 10:49 AM      Passed - Valid encounter within last 12 months    Recent Outpatient Visits          11 months ago Type 2 diabetes mellitus without complication, without long-term current use of insulin [facility]   [facility]  Jerrol Banana., MD   1 year ago Type 2 diabetes mellitus without complication, without long-term current use of insulin [facility])   [facility] Jerrol Banana., MD   1 year ago COVID   [facility] Jerrol Banana., MD   1 year ago Sinusitis, unspecified chronicity, unspecified location   [facility] Jerrol Banana., MD   1 year ago Subacute pansinusitis   [facility] Jerrol Banana., MD

## 2022-04-14 ENCOUNTER — Other Ambulatory Visit: Payer: Self-pay | Admitting: Family Medicine

## 2022-05-04 ENCOUNTER — Other Ambulatory Visit: Payer: Self-pay | Admitting: Family Medicine

## 2022-05-04 DIAGNOSIS — E119 Type 2 diabetes mellitus without complications: Secondary | ICD-10-CM

## 2022-05-19 DIAGNOSIS — M25552 Pain in left hip: Secondary | ICD-10-CM | POA: Diagnosis not present

## 2022-05-19 DIAGNOSIS — M5442 Lumbago with sciatica, left side: Secondary | ICD-10-CM | POA: Diagnosis not present

## 2022-05-19 DIAGNOSIS — E119 Type 2 diabetes mellitus without complications: Secondary | ICD-10-CM | POA: Diagnosis not present

## 2022-05-19 DIAGNOSIS — I10 Essential (primary) hypertension: Secondary | ICD-10-CM | POA: Diagnosis not present

## 2022-05-19 DIAGNOSIS — G8929 Other chronic pain: Secondary | ICD-10-CM | POA: Diagnosis not present

## 2022-05-19 DIAGNOSIS — E559 Vitamin D deficiency, unspecified: Secondary | ICD-10-CM | POA: Diagnosis not present

## 2022-05-19 DIAGNOSIS — I83813 Varicose veins of bilateral lower extremities with pain: Secondary | ICD-10-CM | POA: Diagnosis not present

## 2022-05-19 DIAGNOSIS — E782 Mixed hyperlipidemia: Secondary | ICD-10-CM | POA: Diagnosis not present

## 2022-06-25 DIAGNOSIS — R69 Illness, unspecified: Secondary | ICD-10-CM | POA: Diagnosis not present

## 2022-06-25 DIAGNOSIS — Z1331 Encounter for screening for depression: Secondary | ICD-10-CM | POA: Diagnosis not present

## 2022-06-25 DIAGNOSIS — R232 Flushing: Secondary | ICD-10-CM | POA: Diagnosis not present

## 2022-06-25 DIAGNOSIS — R1900 Intra-abdominal and pelvic swelling, mass and lump, unspecified site: Secondary | ICD-10-CM | POA: Diagnosis not present

## 2022-06-25 DIAGNOSIS — Z1231 Encounter for screening mammogram for malignant neoplasm of breast: Secondary | ICD-10-CM | POA: Diagnosis not present

## 2022-06-25 DIAGNOSIS — Z01419 Encounter for gynecological examination (general) (routine) without abnormal findings: Secondary | ICD-10-CM | POA: Diagnosis not present

## 2022-06-25 DIAGNOSIS — R102 Pelvic and perineal pain: Secondary | ICD-10-CM | POA: Diagnosis not present

## 2022-06-28 ENCOUNTER — Other Ambulatory Visit: Payer: Self-pay | Admitting: Obstetrics and Gynecology

## 2022-06-28 DIAGNOSIS — Z1231 Encounter for screening mammogram for malignant neoplasm of breast: Secondary | ICD-10-CM

## 2022-06-30 ENCOUNTER — Other Ambulatory Visit (INDEPENDENT_AMBULATORY_CARE_PROVIDER_SITE_OTHER): Payer: Self-pay | Admitting: Nurse Practitioner

## 2022-06-30 DIAGNOSIS — I83813 Varicose veins of bilateral lower extremities with pain: Secondary | ICD-10-CM

## 2022-07-04 NOTE — Progress Notes (Signed)
MRN : [id number]  Judith OCCHIPINTI is a 56 y.o. (1966-07-24) female who presents with chief complaint of varicose veins hurt.  History of Present Illness:  The patient is seen for evaluation of symptomatic varicose veins. The patient relates burning and stinging which worsened steadily throughout the course of the day, particularly with standing.  She notes the left leg is more painful than the right.  The patient also notes an aching and throbbing pain over the varicosities predominantly in the ankle area, particularly with prolonged dependent positions. The symptoms are significantly improved with elevation.  The patient also notes that during hot weather the symptoms are greatly intensified. The patient states the pain from the varicose veins interferes with work, daily exercise, shopping and household maintenance. At this point, the symptoms are persistent and severe enough that they're having a negative impact on lifestyle and are interfering with daily activities.  The patient has been seen in this office years ago for the same problem.  Since that visit she has been wearing class I graduated compression. At the present time the patient has  been using over-the-counter analgesics.  She has been exercising on a routine basis.  There is no history of DVT, PE or superficial thrombophlebitis. There is no history of ulceration or hemorrhage. The patient denies a significant family history of varicose veins.  There is no history of prior surgical intervention or sclerotherapy.   No outpatient medications have been marked as taking for the 07/05/22 encounter (Appointment) with Delana Meyer, Dolores Lory, MD.    Past Medical History:  Diagnosis Date   Allergy    Anxiety    Arthritis    KNEE   Asthma    "mild" per pt   Complication of anesthesia    PT HAD A MI AFTER KNEE SURGERY IN 2014 FROM HIGH BLOOD PRESSURE-SURGERY WAS DONE AT AN [place]   Diabetes mellitus  without complication ([place])    Dysrhythmia    sometimes, d/t stress, followed by cardiology   History of hiatal hernia    History of kidney stones    Hypertension    Hypomagnesemia    Melanoma in Situ    >28 years ago   Migraine    Myocardial infarction [facility]) 2014   no stents   Palpitation    Pneumonia    h/o   Simple chronic bronchitis ([place])    Syncope and collapse 07/30/2013   Varicose veins of both lower extremities with pain     Past Surgical History:  Procedure Laterality Date   ANAL FISSURE REPAIR     COLONOSCOPY WITH PROPOFOL N/A 11/13/2020   Procedure: COLONOSCOPY WITH PROPOFOL;  Surgeon: Jonathon Bellows, MD;  Location: [facility] ENDOSCOPY;  Service: Gastroenterology;  Laterality: N/A;  COVID POSITIVE 10/08/2020   ESOPHAGOGASTRODUODENOSCOPY (EGD) WITH PROPOFOL N/A 05/12/2021   Procedure: ESOPHAGOGASTRODUODENOSCOPY (EGD) WITH PROPOFOL;  Surgeon: Lesly Rubenstein, MD;  Location: [facility] ENDOSCOPY;  Service: Endoscopy;  Laterality: N/A;   INTRAUTERINE DEVICE (IUD) INSERTION N/A 11/02/2019   Procedure: INTRAUTERINE DEVICE (IUD) INSERTION - LILETTA;  Surgeon: Benjaman Kindler, MD;  Location: [facility] ORS;  Service: Gynecology;  Laterality: N/A;   IUD REMOVAL N/A 11/02/2019   Procedure: INTRAUTERINE DEVICE (IUD) REMOVAL- MIRENA;  Surgeon: Benjaman Kindler, MD;  Location: [facility] ORS;  Service: Gynecology;  Laterality: N/A;   JOINT REPLACEMENT Right    knee replacement   KNEE ARTHROSCOPY     LAPAROSCOPIC OVARIAN CYSTECTOMY Right 11/02/2019   Procedure: LAPAROSCOPIC PERITUBALCYSTECTOMY;  Surgeon: Benjaman Kindler, MD;  Location: [facility] ORS;  Service: Gynecology;  Laterality: Right;   LAPAROSCOPIC SALPINGO OOPHERECTOMY Left 11/02/2019   Procedure: LAPAROSCOPIC OOPHO;  Surgeon: Benjaman Kindler, MD;  Location: [facility] ORS;  Service: Gynecology;  Laterality: Left;   LAPAROSCOPY N/A 11/02/2019   Procedure: LAPAROSCOPY DIAGNOSTIC WITH PELVIC WASHINGS;  Surgeon: Benjaman Kindler, MD;  Location: [facility] ORS;  Service:  Gynecology;  Laterality: N/A;   TONSILLECTOMY     WISDOM TOOTH EXTRACTION      Social History Social History   Tobacco Use   Smoking status: Never   Smokeless tobacco: Never  Vaping Use   Vaping Use: Never used  Substance Use Topics   Alcohol use: Yes    Alcohol/week: 2.0 standard drinks of alcohol    Types: 1 Glasses of wine, 1 Shots of liquor per week    Comment: average varies, possibly once monthly, socially   Drug use: Never    Family History Family History  Problem Relation Age of Onset   Migraines Mother    Cancer Maternal Aunt        lung   Cancer Maternal Grandmother        breast   Diabetes Maternal Grandmother    Breast cancer Maternal Grandmother 24   Cancer Father        died age 28 with metastatic cancer, uncertain origin   Hypertension Brother    Breast cancer Cousin 65       maternal   Breast cancer Cousin 44       maternal    Allergies  Allergen Reactions   Latex Rash and Swelling    Eye swelling    Butorphanol Other (See Comments)    drowsiness Somnolence Pt "knocked out for 12 hrs" when taking this    Lactose Intolerance (Gi) Other (See Comments)    Reflux and Indigestion     REVIEW OF SYSTEMS (Negative unless checked)  Constitutional: '[]'$ Weight loss  '[]'$ Fever  '[]'$ Chills Cardiac: '[]'$ Chest pain   '[]'$ Chest pressure   '[]'$ Palpitations   '[]'$ Shortness of breath when laying flat   '[]'$ Shortness of breath with exertion. Vascular:  '[]'$ Pain in legs with walking   '[x]'$ Pain in legs with standing  '[]'$ History of DVT   '[]'$ Phlebitis   '[]'$ Swelling in legs   '[x]'$ Varicose veins   '[]'$ Non-healing ulcers Pulmonary:   '[]'$ Uses home oxygen   '[]'$ Productive cough   '[]'$ Hemoptysis   '[]'$ Wheeze  '[]'$ COPD   '[]'$ Asthma Neurologic:  '[]'$ Dizziness   '[]'$ Seizures   '[]'$ History of stroke   '[]'$ History of TIA  '[]'$ Aphasia   '[]'$ Vissual changes   '[]'$ Weakness or numbness in arm   '[]'$ Weakness or numbness in leg Musculoskeletal:   '[]'$ Joint swelling   '[]'$ Joint pain   '[]'$ Low back pain Hematologic:  '[]'$ Easy bruising   '[]'$ Easy bleeding   '[]'$ Hypercoagulable state   '[]'$ Anemic Gastrointestinal:  '[]'$ Diarrhea   '[]'$ Vomiting  '[]'$ Gastroesophageal reflux/heartburn   '[]'$ Difficulty swallowing. Genitourinary:  '[]'$ Chronic kidney disease   '[]'$ Difficult urination  '[]'$ Frequent urination   '[]'$ Blood in urine Skin:  '[]'$ Rashes   '[]'$ Ulcers  Psychological:  '[]'$ History of anxiety   '[]'$  History of major depression.  Physical Examination  There were no vitals filed for this visit. There is no height or weight on file to calculate BMI. Gen: WD/WN, NAD Head: [place]/AT, No temporalis wasting.  Ear/Nose/Throat: Hearing grossly intact, nares w/o erythema or drainage, pinna without lesions Eyes: PER, EOMI, sclera nonicteric.  Neck: Supple, no gross masses.  No JVD.  Pulmonary:  Good air movement, no audible wheezing, no  use of accessory muscles.  Cardiac: RRR, precordium not hyperdynamic. Vascular:  Large varicosities present, greater than 10 mm bilaterally.  Veins are tender to palpation  Mild venous stasis changes to the legs bilaterally.  Trace soft pitting edema CEAP C3sEpAsPr Vessel Right Left  Radial Palpable Palpable  Gastrointestinal: soft, non-distended. No guarding/no peritoneal signs.  Musculoskeletal: M/S 5/5 throughout.  No deformity.  Neurologic: CN 2-12 intact. Pain and light touch intact in extremities.  Symmetrical.  Speech is fluent. Motor exam as listed above. Psychiatric: Judgment intact, Mood & affect appropriate for pt's clinical situation. Dermatologic: Venous rashes no ulcers noted.  No changes consistent with cellulitis. Lymph : No lichenification or skin changes of chronic lymphedema.  CBC Lab Results  Component Value Date   WBC 6.2 05/04/2021   HGB 11.8 05/04/2021   HCT 37.6 05/04/2021   MCV 81 05/04/2021   PLT 256 05/04/2021    BMET    Component Value Date/Time   NA 142 05/04/2021 0904   K 5.0 05/04/2021 0904   CL 104 05/04/2021 0904   CO2 24 05/04/2021 0904   GLUCOSE 127 (H) 05/04/2021 0904   GLUCOSE 136  (H) 03/29/2021 1342   BUN 15 05/04/2021 0904   CREATININE 0.94 05/04/2021 0904   CALCIUM 10.0 05/04/2021 0904   GFRNONAA >60 03/29/2021 1342   GFRAA >60 01/24/2020 0535   CrCl cannot be calculated (Patient's most recent lab result is older than the maximum 21 days allowed.).  COAG No results found for: "INR", "PROTIME"  Radiology No results found.   Assessment/Plan 1. Varicose veins of both lower extremities with pain Recommend  I have reviewed my previous  discussion with the patient regarding  varicose veins and why they cause symptoms. Patient will continue  wearing graduated compression stockings class 1 on a daily basis, beginning first thing in the morning and removing them in the evening.  The patient is CEAP C3sEpAsPr.  She has been expected to utilizing conservative therapies for several years now since her initial evaluation with this office and has noted a steady progression and worsening of her symptoms.  In addition, behavioral modification including elevation during the day was again discussed and this will continue.  The patient has utilized over the counter pain medications and has been exercising.  However, at this time conservative therapy has not alleviated the patient's symptoms of leg pain and swelling.  We would like to move forward with laser ablation of the saphenous veins as soon as possible given the severity of her symptoms.  Recommend: laser ablation of the left and then right great saphenous veins to eliminate the symptoms of pain and swelling of the lower extremities caused by the severe superficial venous reflux disease.    2. Primary hypertension Continue antihypertensive medications as already ordered, these medications have been reviewed and there are no changes at this time.  3. Type 2 diabetes mellitus without complication, without long-term current use of insulin (HCC) Continue hypoglycemic medications as already ordered, these medications have  been reviewed and there are no changes at this time.  Hgb A1C to be monitored as already arranged by primary service  4. Mixed hyperlipidemia Continue statin as ordered and reviewed, no changes at this time    Hortencia Pilar, MD  07/04/2022 3:15 PM

## 2022-07-05 ENCOUNTER — Encounter (INDEPENDENT_AMBULATORY_CARE_PROVIDER_SITE_OTHER): Payer: Self-pay | Admitting: Vascular Surgery

## 2022-07-05 ENCOUNTER — Ambulatory Visit (INDEPENDENT_AMBULATORY_CARE_PROVIDER_SITE_OTHER): Payer: 59 | Admitting: Vascular Surgery

## 2022-07-05 ENCOUNTER — Ambulatory Visit (INDEPENDENT_AMBULATORY_CARE_PROVIDER_SITE_OTHER): Payer: 59

## 2022-07-05 VITALS — BP 145/98 | HR 90 | Resp 18 | Ht 69.0 in | Wt 176.4 lb

## 2022-07-05 DIAGNOSIS — E119 Type 2 diabetes mellitus without complications: Secondary | ICD-10-CM | POA: Diagnosis not present

## 2022-07-05 DIAGNOSIS — I83813 Varicose veins of bilateral lower extremities with pain: Secondary | ICD-10-CM

## 2022-07-05 DIAGNOSIS — I10 Essential (primary) hypertension: Secondary | ICD-10-CM

## 2022-07-05 DIAGNOSIS — E782 Mixed hyperlipidemia: Secondary | ICD-10-CM

## 2022-07-06 DIAGNOSIS — R232 Flushing: Secondary | ICD-10-CM | POA: Diagnosis not present

## 2022-07-06 DIAGNOSIS — R69 Illness, unspecified: Secondary | ICD-10-CM | POA: Diagnosis not present

## 2022-07-13 DIAGNOSIS — R69 Illness, unspecified: Secondary | ICD-10-CM | POA: Diagnosis not present

## 2022-07-27 DIAGNOSIS — R69 Illness, unspecified: Secondary | ICD-10-CM | POA: Diagnosis not present

## 2022-07-28 ENCOUNTER — Telehealth (INDEPENDENT_AMBULATORY_CARE_PROVIDER_SITE_OTHER): Payer: Self-pay

## 2022-07-28 NOTE — Telephone Encounter (Signed)
She can try to use ibuprofen, 800 mg every 6-8 hours. Also, ice on the area.  Ultimately the laser itself will be the biggest help

## 2022-07-30 NOTE — Telephone Encounter (Signed)
Patient made aware with medical recommendations and verbalized understanding 

## 2022-08-03 DIAGNOSIS — E782 Mixed hyperlipidemia: Secondary | ICD-10-CM | POA: Diagnosis not present

## 2022-08-03 DIAGNOSIS — R053 Chronic cough: Secondary | ICD-10-CM | POA: Diagnosis not present

## 2022-08-03 DIAGNOSIS — H6501 Acute serous otitis media, right ear: Secondary | ICD-10-CM | POA: Diagnosis not present

## 2022-08-03 DIAGNOSIS — E119 Type 2 diabetes mellitus without complications: Secondary | ICD-10-CM | POA: Diagnosis not present

## 2022-08-03 DIAGNOSIS — R0989 Other specified symptoms and signs involving the circulatory and respiratory systems: Secondary | ICD-10-CM | POA: Diagnosis not present

## 2022-08-03 DIAGNOSIS — I10 Essential (primary) hypertension: Secondary | ICD-10-CM | POA: Diagnosis not present

## 2022-08-03 DIAGNOSIS — J0100 Acute maxillary sinusitis, unspecified: Secondary | ICD-10-CM | POA: Diagnosis not present

## 2022-08-03 DIAGNOSIS — J329 Chronic sinusitis, unspecified: Secondary | ICD-10-CM | POA: Diagnosis not present

## 2022-08-03 DIAGNOSIS — Z03818 Encounter for observation for suspected exposure to other biological agents ruled out: Secondary | ICD-10-CM | POA: Diagnosis not present

## 2022-09-21 DIAGNOSIS — R058 Other specified cough: Secondary | ICD-10-CM | POA: Diagnosis not present

## 2022-09-21 DIAGNOSIS — J0110 Acute frontal sinusitis, unspecified: Secondary | ICD-10-CM | POA: Diagnosis not present

## 2022-09-28 ENCOUNTER — Ambulatory Visit
Admission: RE | Admit: 2022-09-28 | Discharge: 2022-09-28 | Disposition: A | Discharge status: Home / Self Care | Payer: 59 | Source: Ambulatory Visit | Attending: Obstetrics and Gynecology | Admitting: Obstetrics and Gynecology

## 2022-09-28 DIAGNOSIS — Z1231 Encounter for screening mammogram for malignant neoplasm of breast: Secondary | ICD-10-CM | POA: Diagnosis not present

## 2022-09-29 ENCOUNTER — Other Ambulatory Visit: Payer: Self-pay

## 2022-09-29 MED ORDER — TRULICITY 1.5 MG/0.5ML ~~LOC~~ SOAJ
1.5000 mg | SUBCUTANEOUS | 3 refills | Status: DC
  Filled 2022-09-29: qty 2, 28d supply, fill #0
  Filled 2022-10-19 – 2022-10-21 (×2): qty 2, 28d supply, fill #1
  Filled 2022-11-22: qty 2, 28d supply, fill #2
  Filled 2022-12-22 – 2022-12-27 (×4): qty 2, 28d supply, fill #3
  Filled 2023-01-31: qty 2, 28d supply, fill #4
  Filled 2023-03-01: qty 2, 28d supply, fill #5

## 2022-09-30 ENCOUNTER — Other Ambulatory Visit: Payer: Self-pay

## 2022-10-05 ENCOUNTER — Telehealth (INDEPENDENT_AMBULATORY_CARE_PROVIDER_SITE_OTHER): Payer: Self-pay | Admitting: Vascular Surgery

## 2022-10-05 NOTE — Telephone Encounter (Signed)
Spoke with patient in response to her VM left for me regarding scheduling of the laser ablation appt. I advised pt that we are now able to obtain the prior auth and then schedule. I advised that she is one of the first few since she was seen in November. Pt acknowledged and I will be getting PA and calling to schedule.

## 2022-10-15 ENCOUNTER — Telehealth (INDEPENDENT_AMBULATORY_CARE_PROVIDER_SITE_OTHER): Payer: Self-pay | Admitting: Vascular Surgery

## 2022-10-15 NOTE — Telephone Encounter (Signed)
LVM for pt advising that I did receive the PA for her laser ablation. I also advised that I set up an appt for her on 4.4.24 @ 9:45 for the procedure. I asked for a call back to either accept the appt or R/S. We will also need to make the right leg laser appt as well as the 1 week post ablation [place] and 4 week f/u.

## 2022-10-19 ENCOUNTER — Other Ambulatory Visit: Payer: Self-pay

## 2022-10-21 ENCOUNTER — Other Ambulatory Visit: Payer: Self-pay

## 2022-10-21 MED ORDER — HYDROCOD POLI-CHLORPHE POLI ER 10-8 MG/5ML PO SUER
5.0000 mL | Freq: Two times a day (BID) | ORAL | 0 refills | Status: AC | PRN
  Filled 2022-10-21: qty 115, 12d supply, fill #0

## 2022-10-24 DIAGNOSIS — K449 Diaphragmatic hernia without obstruction or gangrene: Secondary | ICD-10-CM | POA: Diagnosis not present

## 2022-10-24 DIAGNOSIS — K388 Other specified diseases of appendix: Secondary | ICD-10-CM | POA: Diagnosis not present

## 2022-10-24 DIAGNOSIS — K3580 Unspecified acute appendicitis: Secondary | ICD-10-CM | POA: Diagnosis not present

## 2022-10-24 DIAGNOSIS — I10 Essential (primary) hypertension: Secondary | ICD-10-CM | POA: Diagnosis not present

## 2022-10-24 DIAGNOSIS — K3589 Other acute appendicitis without perforation or gangrene: Secondary | ICD-10-CM | POA: Diagnosis not present

## 2022-10-24 DIAGNOSIS — Z0000 Encounter for general adult medical examination without abnormal findings: Secondary | ICD-10-CM | POA: Diagnosis not present

## 2022-10-24 DIAGNOSIS — K37 Unspecified appendicitis: Secondary | ICD-10-CM | POA: Diagnosis not present

## 2022-10-24 DIAGNOSIS — E119 Type 2 diabetes mellitus without complications: Secondary | ICD-10-CM | POA: Diagnosis not present

## 2022-10-24 DIAGNOSIS — T7840XA Allergy, unspecified, initial encounter: Secondary | ICD-10-CM | POA: Diagnosis not present

## 2022-10-24 DIAGNOSIS — R112 Nausea with vomiting, unspecified: Secondary | ICD-10-CM | POA: Diagnosis not present

## 2022-10-24 DIAGNOSIS — K760 Fatty (change of) liver, not elsewhere classified: Secondary | ICD-10-CM | POA: Diagnosis not present

## 2022-10-26 DIAGNOSIS — K3580 Unspecified acute appendicitis: Secondary | ICD-10-CM | POA: Diagnosis not present

## 2022-11-01 ENCOUNTER — Telehealth (INDEPENDENT_AMBULATORY_CARE_PROVIDER_SITE_OTHER): Payer: Self-pay | Admitting: Nurse Practitioner

## 2022-11-01 NOTE — Telephone Encounter (Signed)
If she is fully healed and not having any issues, we can proceed but if she's having issues with abdominal pain/diarrhea, we should hold off

## 2022-11-01 NOTE — Telephone Encounter (Signed)
Pt called in to ask if it is still ok for her to have her laser ablation on November 11, 2022. She had an emergency appendectomy in [place], [place] on October 24, 2022.

## 2022-11-09 DIAGNOSIS — E559 Vitamin D deficiency, unspecified: Secondary | ICD-10-CM | POA: Diagnosis not present

## 2022-11-09 DIAGNOSIS — E119 Type 2 diabetes mellitus without complications: Secondary | ICD-10-CM | POA: Diagnosis not present

## 2022-11-09 DIAGNOSIS — E782 Mixed hyperlipidemia: Secondary | ICD-10-CM | POA: Diagnosis not present

## 2022-11-09 DIAGNOSIS — R6882 Decreased libido: Secondary | ICD-10-CM | POA: Diagnosis not present

## 2022-11-09 DIAGNOSIS — Z0000 Encounter for general adult medical examination without abnormal findings: Secondary | ICD-10-CM | POA: Diagnosis not present

## 2022-11-09 DIAGNOSIS — I10 Essential (primary) hypertension: Secondary | ICD-10-CM | POA: Diagnosis not present

## 2022-11-09 DIAGNOSIS — K36 Other appendicitis: Secondary | ICD-10-CM | POA: Diagnosis not present

## 2022-11-10 NOTE — Progress Notes (Signed)
    MRN : [id number]  Judith Fox is a 57 y.o. ([birth year]) female who presents with chief complaint of No chief complaint on file. .    The patient's left lower extremity was sterilely prepped and draped.  The ultrasound machine was used to visualize the left great  saphenous vein throughout its course.  A segment at the knee was selected for access.  The saphenous vein was accessed without difficulty using ultrasound guidance with a micropuncture needle.   An 0.018  wire was placed beyond the saphenofemoral junction through the sheath and the microneedle was removed.  The 65 cm sheath was then placed over the wire and the wire and dilator were removed.  The laser fiber was placed through the sheath and its tip was placed approximately 2 cm below the saphenofemoral junction.  Tumescent anesthesia was then created with a dilute lidocaine solution.  Laser energy was then delivered with constant withdrawal of the sheath and laser fiber.  Approximately 1932 Joules of energy were delivered over a length of 37 cm.  Sterile dressings were placed.  The patient tolerated the procedure well without complications.

## 2022-11-11 ENCOUNTER — Encounter (INDEPENDENT_AMBULATORY_CARE_PROVIDER_SITE_OTHER): Payer: Self-pay | Admitting: Vascular Surgery

## 2022-11-11 ENCOUNTER — Ambulatory Visit (INDEPENDENT_AMBULATORY_CARE_PROVIDER_SITE_OTHER): Payer: 59 | Admitting: Vascular Surgery

## 2022-11-11 VITALS — BP 132/81 | HR 90 | Resp 16 | Wt 159.0 lb

## 2022-11-11 DIAGNOSIS — I83813 Varicose veins of bilateral lower extremities with pain: Secondary | ICD-10-CM

## 2022-11-12 ENCOUNTER — Encounter (INDEPENDENT_AMBULATORY_CARE_PROVIDER_SITE_OTHER): Payer: Self-pay | Admitting: Vascular Surgery

## 2022-11-15 ENCOUNTER — Other Ambulatory Visit (INDEPENDENT_AMBULATORY_CARE_PROVIDER_SITE_OTHER): Payer: Self-pay | Admitting: Vascular Surgery

## 2022-11-15 DIAGNOSIS — I83813 Varicose veins of bilateral lower extremities with pain: Secondary | ICD-10-CM

## 2022-11-17 DIAGNOSIS — R4189 Other symptoms and signs involving cognitive functions and awareness: Secondary | ICD-10-CM | POA: Diagnosis not present

## 2022-11-17 DIAGNOSIS — E782 Mixed hyperlipidemia: Secondary | ICD-10-CM | POA: Diagnosis not present

## 2022-11-17 DIAGNOSIS — K36 Other appendicitis: Secondary | ICD-10-CM | POA: Diagnosis not present

## 2022-11-17 DIAGNOSIS — I10 Essential (primary) hypertension: Secondary | ICD-10-CM | POA: Diagnosis not present

## 2022-11-17 DIAGNOSIS — E119 Type 2 diabetes mellitus without complications: Secondary | ICD-10-CM | POA: Diagnosis not present

## 2022-11-17 DIAGNOSIS — Z0000 Encounter for general adult medical examination without abnormal findings: Secondary | ICD-10-CM | POA: Diagnosis not present

## 2022-11-18 ENCOUNTER — Ambulatory Visit (INDEPENDENT_AMBULATORY_CARE_PROVIDER_SITE_OTHER): Payer: 59

## 2022-11-18 DIAGNOSIS — I83813 Varicose veins of bilateral lower extremities with pain: Secondary | ICD-10-CM | POA: Diagnosis not present

## 2022-11-22 ENCOUNTER — Other Ambulatory Visit: Payer: Self-pay

## 2022-11-26 DIAGNOSIS — E538 Deficiency of other specified B group vitamins: Secondary | ICD-10-CM | POA: Diagnosis not present

## 2022-12-15 NOTE — Progress Notes (Unsigned)
    MRN : [id number]  Judith Fox is a 57 y.o. (1965-09-14) female who presents with chief complaint of No chief complaint on file. .    The patient's right lower extremity was sterilely prepped and draped.  The ultrasound machine was used to visualize the right great saphenous vein throughout its course.  Initially a segment at the knee was selected for access.  The J-wire ran up several varicose sidebranches ultimately spasm was induced and the saphenous vein proper as well.  Moving more proximally the saphenous vein was accessed mid thigh using ultrasound guidance with a micropuncture needle.   An 0.018  wire was placed beyond the saphenofemoral junction through the sheath and the microneedle was removed.  The 65 cm sheath was then placed over the wire and the wire and dilator were removed.  The laser fiber was placed through the sheath and its tip was placed approximately 2 cm below the saphenofemoral junction.  Tumescent anesthesia was then created with a dilute lidocaine solution.  Laser energy was then delivered with constant withdrawal of the sheath and laser fiber.  Approximately 1161 joules of energy were delivered over a length of 18 cm.  Sterile dressings were placed.  The patient tolerated the procedure well without complications.

## 2022-12-16 ENCOUNTER — Ambulatory Visit (INDEPENDENT_AMBULATORY_CARE_PROVIDER_SITE_OTHER): Payer: 59 | Admitting: Vascular Surgery

## 2022-12-16 ENCOUNTER — Encounter (INDEPENDENT_AMBULATORY_CARE_PROVIDER_SITE_OTHER): Payer: Self-pay | Admitting: Vascular Surgery

## 2022-12-16 VITALS — BP 121/79 | HR 96 | Resp 16 | Wt 156.0 lb

## 2022-12-16 DIAGNOSIS — I83813 Varicose veins of bilateral lower extremities with pain: Secondary | ICD-10-CM | POA: Diagnosis not present

## 2022-12-21 ENCOUNTER — Other Ambulatory Visit (INDEPENDENT_AMBULATORY_CARE_PROVIDER_SITE_OTHER): Payer: Self-pay | Admitting: Vascular Surgery

## 2022-12-21 DIAGNOSIS — I83813 Varicose veins of bilateral lower extremities with pain: Secondary | ICD-10-CM

## 2022-12-22 ENCOUNTER — Other Ambulatory Visit: Payer: Self-pay

## 2022-12-23 ENCOUNTER — Ambulatory Visit (INDEPENDENT_AMBULATORY_CARE_PROVIDER_SITE_OTHER): Payer: 59

## 2022-12-23 ENCOUNTER — Other Ambulatory Visit: Payer: Self-pay

## 2022-12-23 DIAGNOSIS — I83813 Varicose veins of bilateral lower extremities with pain: Secondary | ICD-10-CM

## 2022-12-24 ENCOUNTER — Other Ambulatory Visit: Payer: Self-pay

## 2022-12-27 ENCOUNTER — Other Ambulatory Visit: Payer: Self-pay

## 2022-12-29 ENCOUNTER — Other Ambulatory Visit: Payer: Self-pay

## 2022-12-30 ENCOUNTER — Other Ambulatory Visit: Payer: Self-pay

## 2023-01-10 DIAGNOSIS — K645 Perianal venous thrombosis: Secondary | ICD-10-CM | POA: Diagnosis not present

## 2023-01-13 ENCOUNTER — Ambulatory Visit (INDEPENDENT_AMBULATORY_CARE_PROVIDER_SITE_OTHER): Payer: 59 | Admitting: Vascular Surgery

## 2023-01-17 ENCOUNTER — Ambulatory Visit (INDEPENDENT_AMBULATORY_CARE_PROVIDER_SITE_OTHER): Payer: 59 | Admitting: Vascular Surgery

## 2023-01-17 ENCOUNTER — Encounter (INDEPENDENT_AMBULATORY_CARE_PROVIDER_SITE_OTHER): Payer: Self-pay | Admitting: Vascular Surgery

## 2023-01-17 VITALS — BP 166/83 | HR 66 | Resp 16 | Wt 155.4 lb

## 2023-01-17 DIAGNOSIS — I10 Essential (primary) hypertension: Secondary | ICD-10-CM

## 2023-01-17 DIAGNOSIS — I83813 Varicose veins of bilateral lower extremities with pain: Secondary | ICD-10-CM

## 2023-01-17 DIAGNOSIS — E119 Type 2 diabetes mellitus without complications: Secondary | ICD-10-CM

## 2023-01-17 DIAGNOSIS — E782 Mixed hyperlipidemia: Secondary | ICD-10-CM

## 2023-01-18 ENCOUNTER — Encounter (INDEPENDENT_AMBULATORY_CARE_PROVIDER_SITE_OTHER): Payer: Self-pay | Admitting: Vascular Surgery

## 2023-01-18 NOTE — Progress Notes (Signed)
MRN : [id number]  Judith Fox is a 57 y.o. (1966-04-17) female who presents with chief complaint of varicose veins hurt.  History of Present Illness:   The patient returns to the office for followup status post laser ablation of the left great saphenous vein on 11/11/2022 and laser ablation of the right great saphenous vein on 12/16/2022.  The patient note significant improvement in the lower extremity pain but not resolution of the symptoms. The patient notes multiple residual varicosities bilaterally which continued to hurt with dependent positions and remained tender to palpation. The patient's swelling is minimally from preoperative status. The patient continues to wear graduated compression stockings on a daily basis but these are not eliminating the pain and discomfort. The patient continues to use over-the-counter anti-inflammatory medications to treat the pain and related symptoms but this has not given the patient relief. The patient notes the pain in the lower extremities is causing problems with daily exercise, problems at work and even with household activities such as preparing meals and doing dishes.  The patient is otherwise done well and there have been no complications related to the laser procedure or interval changes in the patient's overall   Post laser ultrasound shows successful ablation of the left and right great saphenous veins.  Studies are dated November 18, 2022 for the left and Dec 23, 2022 for the right.  Current Meds  Medication Sig   aspirin EC 81 MG tablet Take 81 mg by mouth daily.   atorvastatin (LIPITOR) 40 MG tablet TAKE 1 TABLET BY MOUTH EVERY DAY   benazepril (LOTENSIN) 40 MG tablet TAKE 1 TABLET BY MOUTH EVERY DAY   doxycycline (PERIOSTAT) 20 MG tablet TAKE 1 TABLET (20 MG TOTAL) BY MOUTH 2 (TWO) TIMES DAILY. TAKE WITH FOOD   Dulaglutide (TRULICITY) 1.5 MG/0.5ML SOPN Inject 1.5 mg into the skin once a week.   fluconazole (DIFLUCAN) 150  MG tablet Take 150 mg by mouth once a week. And prn   glipiZIDE (GLUCOTROL) 10 MG tablet TAKE 1 TABLET BY MOUTH EVERY DAY BEFORE BREAKFAST   meloxicam (MOBIC) 15 MG tablet Take by mouth.   metFORMIN (GLUCOPHAGE) 1000 MG tablet TAKE 1 TABLET BY MOUTH 2 TIMES DAILY WITH A MEAL.   Multiple Vitamin (MULTIVITAMIN) capsule Take 1 capsule by mouth daily.   nitroGLYCERIN (NITROSTAT) 0.4 MG SL tablet PLACE 1 TABLET UNDER THE TONGUE EVERY 5 MINUTES AS NEEDED FOR CHEST PAIN.   pantoprazole (PROTONIX) 40 MG tablet Take 40 mg by mouth daily.   RESTASIS 0.05 % ophthalmic emulsion Place 1 drop into both eyes 2 (two) times daily.    sucralfate (CARAFATE) 1 g tablet TAKE 1 TABLET (1 G TOTAL) BY MOUTH 4 (FOUR) TIMES DAILY - BEFORE MEALS AND AT BEDTIME.    Past Medical History:  Diagnosis Date   Allergy    Anxiety    Arthritis    KNEE   Asthma    "mild" per pt   Complication of anesthesia    PT HAD A MI AFTER KNEE SURGERY IN 2014 FROM HIGH BLOOD PRESSURE-SURGERY WAS DONE AT AN OUTPATIENT SURGERY CENTER   Diabetes mellitus without complication (HCC)    Dysrhythmia    sometimes, d/t stress, followed by cardiology   History of hiatal hernia    History of kidney stones    Hypertension    Hypomagnesemia    Melanoma in Situ    >28 years  ago   Migraine    Myocardial infarction [facility]) 2014   no stents   Palpitation    Pneumonia    h/o   Simple chronic bronchitis (HCC)    Syncope and collapse 07/30/2013   Varicose veins of both lower extremities with pain     Past Surgical History:  Procedure Laterality Date   ANAL FISSURE REPAIR     COLONOSCOPY WITH PROPOFOL N/A 11/13/2020   Procedure: COLONOSCOPY WITH PROPOFOL;  Surgeon: Wyline Mood, MD;  Location: [facility] ENDOSCOPY;  Service: Gastroenterology;  Laterality: N/A;  COVID POSITIVE 10/08/2020   ESOPHAGOGASTRODUODENOSCOPY (EGD) WITH PROPOFOL N/A 05/12/2021   Procedure: ESOPHAGOGASTRODUODENOSCOPY (EGD) WITH PROPOFOL;  Surgeon: Regis Bill, MD;   Location: [facility] ENDOSCOPY;  Service: Endoscopy;  Laterality: N/A;   INTRAUTERINE DEVICE (IUD) INSERTION N/A 11/02/2019   Procedure: INTRAUTERINE DEVICE (IUD) INSERTION - LILETTA;  Surgeon: Christeen Douglas, MD;  Location: [facility] ORS;  Service: Gynecology;  Laterality: N/A;   IUD REMOVAL N/A 11/02/2019   Procedure: INTRAUTERINE DEVICE (IUD) REMOVAL- MIRENA;  Surgeon: Christeen Douglas, MD;  Location: [facility] ORS;  Service: Gynecology;  Laterality: N/A;   JOINT REPLACEMENT Right    knee replacement   KNEE ARTHROSCOPY     LAPAROSCOPIC OVARIAN CYSTECTOMY Right 11/02/2019   Procedure: LAPAROSCOPIC PERITUBALCYSTECTOMY;  Surgeon: Christeen Douglas, MD;  Location: [facility] ORS;  Service: Gynecology;  Laterality: Right;   LAPAROSCOPIC SALPINGO OOPHERECTOMY Left 11/02/2019   Procedure: LAPAROSCOPIC OOPHO;  Surgeon: Christeen Douglas, MD;  Location: [facility] ORS;  Service: Gynecology;  Laterality: Left;   LAPAROSCOPY N/A 11/02/2019   Procedure: LAPAROSCOPY DIAGNOSTIC WITH PELVIC WASHINGS;  Surgeon: Christeen Douglas, MD;  Location: [facility] ORS;  Service: Gynecology;  Laterality: N/A;   TONSILLECTOMY     WISDOM TOOTH EXTRACTION      Social History Social History   Tobacco Use   Smoking status: Never   Smokeless tobacco: Never  Vaping Use   Vaping Use: Never used  Substance Use Topics   Alcohol use: Yes    Alcohol/week: 2.0 standard drinks of alcohol    Types: 1 Glasses of wine, 1 Shots of liquor per week    Comment: average varies, possibly once monthly, socially   Drug use: Never    Family History Family History  Problem Relation Age of Onset   Migraines Mother    Cancer Maternal Aunt        lung   Cancer Maternal Grandmother        breast   Diabetes Maternal Grandmother    Breast cancer Maternal Grandmother 38   Cancer Father        died age 51 with metastatic cancer, uncertain origin   Hypertension Brother    Breast cancer Cousin 40       maternal   Breast cancer Cousin 50       maternal     Allergies  Allergen Reactions   Latex Rash and Swelling    Eye swelling    Butorphanol Other (See Comments)    drowsiness Somnolence Pt "knocked out for 12 hrs" when taking this    Lactose Intolerance (Gi) Other (See Comments)    Reflux and Indigestion     REVIEW OF SYSTEMS (Negative unless checked)  Constitutional: [] Weight loss  [] Fever  [] Chills Cardiac: [] Chest pain   [] Chest pressure   [] Palpitations   [] Shortness of breath when laying flat   [] Shortness of breath with exertion. Vascular:  [] Pain in legs with walking   [x] Pain in legs with standing  [] History  of DVT   [] Phlebitis   [] Swelling in legs   [x] Varicose veins   [] Non-healing ulcers Pulmonary:   [] Uses home oxygen   [] Productive cough   [] Hemoptysis   [] Wheeze  [] COPD   [] Asthma Neurologic:  [] Dizziness   [] Seizures   [] History of stroke   [] History of TIA  [] Aphasia   [] Vissual changes   [] Weakness or numbness in arm   [] Weakness or numbness in leg Musculoskeletal:   [] Joint swelling   [] Joint pain   [] Low back pain Hematologic:  [] Easy bruising  [] Easy bleeding   [] Hypercoagulable state   [] Anemic Gastrointestinal:  [] Diarrhea   [] Vomiting  [] Gastroesophageal reflux/heartburn   [] Difficulty swallowing. Genitourinary:  [] Chronic kidney disease   [] Difficult urination  [] Frequent urination   [] Blood in urine Skin:  [] Rashes   [] Ulcers  Psychological:  [] History of anxiety   []  History of major depression.  Physical Examination  Vitals:   01/17/23 1610  BP: (!) 166/83  Pulse: 66  Resp: 16  Weight: 155 lb 6.4 oz (70.5 kg)   Body mass index is 22.95 kg/m. Gen: WD/WN, NAD Head: [place]/AT, No temporalis wasting.  Ear/Nose/Throat: Hearing grossly intact, nares w/o erythema or drainage, pinna without lesions Eyes: PER, EOMI, sclera nonicteric.  Neck: Supple, no gross masses.  No JVD.  Pulmonary:  Good air movement, no audible wheezing, no use of accessory muscles.  Cardiac: RRR, precordium not  hyperdynamic. Vascular:  Large varicosities present, greater than 10 mm bilaterally.  Veins are tender to palpation  Mild venous stasis changes to the legs bilaterally.  Trace soft pitting edema CEAP C3sEpAsPr Vessel Right Left  Radial Palpable Palpable  Gastrointestinal: soft, non-distended. No guarding/no peritoneal signs.  Musculoskeletal: M/S 5/5 throughout.  No deformity.  Neurologic: CN 2-12 intact. Pain and light touch intact in extremities.  Symmetrical.  Speech is fluent. Motor exam as listed above. Psychiatric: Judgment intact, Mood & affect appropriate for pt's clinical situation. Dermatologic: Venous rashes no ulcers noted.  No changes consistent with cellulitis. Lymph : No lichenification or skin changes of chronic lymphedema.  CBC Lab Results  Component Value Date   WBC 6.2 05/04/2021   HGB 11.8 05/04/2021   HCT 37.6 05/04/2021   MCV 81 05/04/2021   PLT 256 05/04/2021    BMET    Component Value Date/Time   NA 142 05/04/2021 0904   K 5.0 05/04/2021 0904   CL 104 05/04/2021 0904   CO2 24 05/04/2021 0904   GLUCOSE 127 (H) 05/04/2021 0904   GLUCOSE 136 (H) 03/29/2021 1342   BUN 15 05/04/2021 0904   CREATININE 0.94 05/04/2021 0904   CALCIUM 10.0 05/04/2021 0904   GFRNONAA >60 03/29/2021 1342   GFRAA >60 01/24/2020 0535   CrCl cannot be calculated (Patient's most recent lab result is older than the maximum 21 days allowed.).  COAG No results found for: "INR", "PROTIME"  Radiology VAS [place] LOWER EXTREMITY VENOUS POST ABLATION  Result Date: 12/24/2022  Lower Venous Reflux Study Patient Name:  Judith Fox  Date of Exam:   12/23/2022 Medical Rec #: [id number]               Accession #:    [id number] Date of Birth: 1966/06/16               Patient Gender: F Patient Age:   41 years Exam Location:  [facility] Procedure:      VAS [place] LOWER EXTREMITY VENOUS POST ABLATION Referring Phys: Earl Lites Maday Guarino  --------------------------------------------------------------------------------  Indications: Post Ablation Imaging of the Rt GSV.  Performing Technologist: Debbe Bales RVS  Examination Guidelines: A complete evaluation includes B-mode imaging, spectral Doppler, color Doppler, and power Doppler as needed of all accessible portions of each vessel. Bilateral testing is considered an integral part of a complete examination. Limited examinations for reoccurring indications may be performed as noted. The reflux portion of the exam is performed with the patient in reverse Trendelenburg. Significant venous reflux is defined as >500 ms in the superficial venous system, and >1 second in the deep venous system.   Summary: Right: - No evidence of deep vein thrombosis seen in the right lower extremity, from the common femoral through the popliteal veins. - No evidence of superficial venous thrombosis in the right lower extremity. - There is no evidence of venous reflux seen in the right lower extremity. - No evidence of superficial venous reflux seen in the right greater saphenous vein. - No evidence of superficial venous reflux seen in the right short saphenous vein. - The Right GSV Thigh area appears to display no flow via Ablation from the Level of the Knee to approximatel .39 cm to the SFJ.  *See table(s) above for measurements and observations. Electronically signed by Levora Dredge MD on 12/24/2022 at 8:35:56 AM.    Final      Assessment/Plan 1. Varicose veins of both lower extremities with pain Recommend:  The patient has had successful ablation of the previously incompetent saphenous venous system bilaterally but still has large varicosities with persistent symptoms of pain and swelling that are having a negative impact on daily life and daily activities.  Patient should undergo injection sclerotherapy to treat the residual varicosities.  The risks, benefits and alternative therapies were reviewed in  detail with the patient.  All questions were answered.  The patient agrees to proceed with sclerotherapy at their convenience.  The patient will continue wearing the graduated compression stockings and using the over-the-counter pain medications to treat her symptoms.    2. Primary hypertension Continue antihypertensive medications as already ordered, these medications have been reviewed and there are no changes at this time.  3. Type 2 diabetes mellitus without complication, without long-term current use of insulin (HCC) Continue hypoglycemic medications as already ordered, these medications have been reviewed and there are no changes at this time.  Hgb A1C to be monitored as already arranged by primary service  4. Mixed hyperlipidemia Continue statin as ordered and reviewed, no changes at this time     Levora Dredge, MD  01/18/2023 9:42 AM

## 2023-01-31 ENCOUNTER — Other Ambulatory Visit: Payer: Self-pay

## 2023-02-16 ENCOUNTER — Telehealth (INDEPENDENT_AMBULATORY_CARE_PROVIDER_SITE_OTHER): Payer: Self-pay | Admitting: Vascular Surgery

## 2023-02-16 NOTE — Telephone Encounter (Signed)
LVM for pt trying to discuss FOAM vs SALINE sclero. Pt states that she is sure that Dr. Gilda Crease said she needs FOAM but the encounter form and Dr. Marijean Heath notes do not say FOAM. I explained that he is out of town and I will have to double check with him next week. I advised to call with any questions.

## 2023-03-01 ENCOUNTER — Ambulatory Visit (INDEPENDENT_AMBULATORY_CARE_PROVIDER_SITE_OTHER): Payer: 59 | Admitting: Nurse Practitioner

## 2023-03-13 NOTE — Progress Notes (Unsigned)
MRN : [id number]  Judith Fox is a 57 y.o. (01/20/1966) female who presents with chief complaint of varicose veins hurt.  History of Present Illness: ***  No outpatient medications have been marked as taking for the 03/14/23 encounter (Appointment) with Gilda Crease, Latina Craver, MD.    Past Medical History:  Diagnosis Date   Allergy    Anxiety    Arthritis    KNEE   Asthma    "mild" per pt   Complication of anesthesia    PT HAD A MI AFTER KNEE SURGERY IN 2014 FROM HIGH BLOOD PRESSURE-SURGERY WAS DONE AT AN OUTPATIENT SURGERY CENTER   Diabetes mellitus without complication (HCC)    Dysrhythmia    sometimes, d/t stress, followed by cardiology   History of hiatal hernia    History of kidney stones    Hypertension    Hypomagnesemia    Melanoma in Situ    >28 years ago   Migraine    Myocardial infarction [facility]) 2014   no stents   Palpitation    Pneumonia    h/o   Simple chronic bronchitis (HCC)    Syncope and collapse 07/30/2013   Varicose veins of both lower extremities with pain     Past Surgical History:  Procedure Laterality Date   ANAL FISSURE REPAIR     COLONOSCOPY WITH PROPOFOL N/A 11/13/2020   Procedure: COLONOSCOPY WITH PROPOFOL;  Surgeon: Wyline Mood, MD;  Location: [facility] ENDOSCOPY;  Service: Gastroenterology;  Laterality: N/A;  COVID POSITIVE 10/08/2020   ESOPHAGOGASTRODUODENOSCOPY (EGD) WITH PROPOFOL N/A 05/12/2021   Procedure: ESOPHAGOGASTRODUODENOSCOPY (EGD) WITH PROPOFOL;  Surgeon: Regis Bill, MD;  Location: [facility] ENDOSCOPY;  Service: Endoscopy;  Laterality: N/A;   INTRAUTERINE DEVICE (IUD) INSERTION N/A 11/02/2019   Procedure: INTRAUTERINE DEVICE (IUD) INSERTION - LILETTA;  Surgeon: Christeen Douglas, MD;  Location: [facility] ORS;  Service: Gynecology;  Laterality: N/A;   IUD REMOVAL N/A 11/02/2019   Procedure: INTRAUTERINE DEVICE (IUD) REMOVAL- MIRENA;  Surgeon: Christeen Douglas, MD;  Location: [facility] ORS;  Service: Gynecology;  Laterality:  N/A;   JOINT REPLACEMENT Right    knee replacement   KNEE ARTHROSCOPY     LAPAROSCOPIC OVARIAN CYSTECTOMY Right 11/02/2019   Procedure: LAPAROSCOPIC PERITUBALCYSTECTOMY;  Surgeon: Christeen Douglas, MD;  Location: [facility] ORS;  Service: Gynecology;  Laterality: Right;   LAPAROSCOPIC SALPINGO OOPHERECTOMY Left 11/02/2019   Procedure: LAPAROSCOPIC OOPHO;  Surgeon: Christeen Douglas, MD;  Location: [facility] ORS;  Service: Gynecology;  Laterality: Left;   LAPAROSCOPY N/A 11/02/2019   Procedure: LAPAROSCOPY DIAGNOSTIC WITH PELVIC WASHINGS;  Surgeon: Christeen Douglas, MD;  Location: [facility] ORS;  Service: Gynecology;  Laterality: N/A;   TONSILLECTOMY     WISDOM TOOTH EXTRACTION      Social History Social History   Tobacco Use   Smoking status: Never   Smokeless tobacco: Never  Vaping Use   Vaping status: Never Used  Substance Use Topics   Alcohol use: Yes    Alcohol/week: 2.0 standard drinks of alcohol    Types: 1 Glasses of wine, 1 Shots of liquor per week    Comment: average varies, possibly once monthly, socially   Drug use: Never    Family History Family History  Problem Relation Age of Onset   Migraines Mother    Cancer Maternal Aunt        lung   Cancer Maternal Grandmother        breast   Diabetes  Maternal Grandmother    Breast cancer Maternal Grandmother 39   Cancer Father        died age 33 with metastatic cancer, uncertain origin   Hypertension Brother    Breast cancer Cousin 40       maternal   Breast cancer Cousin 50       maternal    Allergies  Allergen Reactions   Latex Rash and Swelling    Eye swelling    Butorphanol Other (See Comments)    drowsiness Somnolence Pt "knocked out for 12 hrs" when taking this    Lactose Intolerance (Gi) Other (See Comments)    Reflux and Indigestion     REVIEW OF SYSTEMS (Negative unless checked)  Constitutional: [] Weight loss  [] Fever  [] Chills Cardiac: [] Chest pain   [] Chest pressure   [] Palpitations   [] Shortness of breath  when laying flat   [] Shortness of breath with exertion. Vascular:  [] Pain in legs with walking   [x] Pain in legs with standing  [] History of DVT   [] Phlebitis   [] Swelling in legs   [x] Varicose veins   [] Non-healing ulcers Pulmonary:   [] Uses home oxygen   [] Productive cough   [] Hemoptysis   [] Wheeze  [] COPD   [] Asthma Neurologic:  [] Dizziness   [] Seizures   [] History of stroke   [] History of TIA  [] Aphasia   [] Vissual changes   [] Weakness or numbness in arm   [] Weakness or numbness in leg Musculoskeletal:   [] Joint swelling   [] Joint pain   [] Low back pain Hematologic:  [] Easy bruising  [] Easy bleeding   [] Hypercoagulable state   [] Anemic Gastrointestinal:  [] Diarrhea   [] Vomiting  [] Gastroesophageal reflux/heartburn   [] Difficulty swallowing. Genitourinary:  [] Chronic kidney disease   [] Difficult urination  [] Frequent urination   [] Blood in urine Skin:  [] Rashes   [] Ulcers  Psychological:  [] History of anxiety   []  History of major depression.  Physical Examination  There were no vitals filed for this visit. There is no height or weight on file to calculate BMI. Gen: WD/WN, NAD Head: [place]/AT, No temporalis wasting.  Ear/Nose/Throat: Hearing grossly intact, nares w/o erythema or drainage, pinna without lesions Eyes: PER, EOMI, sclera nonicteric.  Neck: Supple, no gross masses.  No JVD.  Pulmonary:  Good air movement, no audible wheezing, no use of accessory muscles.  Cardiac: RRR, precordium not hyperdynamic. Vascular:  Large varicosities present, greater than 10 mm ***.  Veins are tender to palpation  Mild venous stasis changes to the legs bilaterally.  Trace soft pitting edema CEAP C3sEpAsPr Vessel Right Left  Radial Palpable Palpable  Gastrointestinal: soft, non-distended. No guarding/no peritoneal signs.  Musculoskeletal: M/S 5/5 throughout.  No deformity.  Neurologic: CN 2-12 intact. Pain and light touch intact in extremities.  Symmetrical.  Speech is fluent. Motor exam as listed  above. Psychiatric: Judgment intact, Mood & affect appropriate for pt's clinical situation. Dermatologic: Venous rashes no ulcers noted.  No changes consistent with cellulitis. Lymph : No lichenification or skin changes of chronic lymphedema.  CBC Lab Results  Component Value Date   WBC 6.2 05/04/2021   HGB 11.8 05/04/2021   HCT 37.6 05/04/2021   MCV 81 05/04/2021   PLT 256 05/04/2021    BMET    Component Value Date/Time   NA 142 05/04/2021 0904   K 5.0 05/04/2021 0904   CL 104 05/04/2021 0904   CO2 24 05/04/2021 0904   GLUCOSE 127 (H) 05/04/2021 0904   GLUCOSE 136 (H) 03/29/2021 1342   BUN  15 05/04/2021 0904   CREATININE 0.94 05/04/2021 0904   CALCIUM 10.0 05/04/2021 0904   GFRNONAA >60 03/29/2021 1342   GFRAA >60 01/24/2020 0535   CrCl cannot be calculated (Patient's most recent lab result is older than the maximum 21 days allowed.).  COAG No results found for: "INR", "PROTIME"  Radiology No results found.   Assessment/Plan There are no diagnoses linked to this encounter.   Levora Dredge, MD  03/13/2023 3:42 PM

## 2023-03-14 ENCOUNTER — Encounter (INDEPENDENT_AMBULATORY_CARE_PROVIDER_SITE_OTHER): Payer: Self-pay | Admitting: Vascular Surgery

## 2023-03-14 ENCOUNTER — Ambulatory Visit (INDEPENDENT_AMBULATORY_CARE_PROVIDER_SITE_OTHER): Payer: 59 | Admitting: Vascular Surgery

## 2023-03-14 VITALS — BP 147/97 | HR 86 | Resp 16 | Wt 147.8 lb

## 2023-03-14 DIAGNOSIS — E119 Type 2 diabetes mellitus without complications: Secondary | ICD-10-CM | POA: Diagnosis not present

## 2023-03-14 DIAGNOSIS — I83813 Varicose veins of bilateral lower extremities with pain: Secondary | ICD-10-CM | POA: Diagnosis not present

## 2023-03-14 DIAGNOSIS — E782 Mixed hyperlipidemia: Secondary | ICD-10-CM

## 2023-03-14 DIAGNOSIS — I10 Essential (primary) hypertension: Secondary | ICD-10-CM

## 2023-03-15 ENCOUNTER — Encounter (INDEPENDENT_AMBULATORY_CARE_PROVIDER_SITE_OTHER): Payer: Self-pay | Admitting: Vascular Surgery

## 2023-03-16 ENCOUNTER — Telehealth (INDEPENDENT_AMBULATORY_CARE_PROVIDER_SITE_OTHER): Payer: Self-pay

## 2023-03-16 NOTE — Telephone Encounter (Signed)
Called pt to schedule foam sclero no answer LVM to call office to get an appt scheduled

## 2023-03-28 ENCOUNTER — Ambulatory Visit (INDEPENDENT_AMBULATORY_CARE_PROVIDER_SITE_OTHER): Payer: 59 | Admitting: Vascular Surgery

## 2023-03-28 ENCOUNTER — Encounter (INDEPENDENT_AMBULATORY_CARE_PROVIDER_SITE_OTHER): Payer: Self-pay | Admitting: Vascular Surgery

## 2023-03-28 VITALS — BP 150/91 | HR 78 | Resp 16 | Wt 149.2 lb

## 2023-03-28 DIAGNOSIS — I83813 Varicose veins of bilateral lower extremities with pain: Secondary | ICD-10-CM | POA: Diagnosis not present

## 2023-03-28 NOTE — Progress Notes (Signed)
   Indication:  Patient presents with symptomatic varicose veins of the bilateral lower extremity.  Procedure:  Sclerotherapy using STS foam mixed with 1% Lidocaine was performed on the bilateral lower extremity.  Compression wraps were placed.  The patient tolerated the procedure well.  Plan:  Follow up as needed.

## 2023-04-18 ENCOUNTER — Encounter (INDEPENDENT_AMBULATORY_CARE_PROVIDER_SITE_OTHER): Payer: Self-pay | Admitting: Vascular Surgery

## 2023-04-18 ENCOUNTER — Ambulatory Visit (INDEPENDENT_AMBULATORY_CARE_PROVIDER_SITE_OTHER): Payer: 59 | Admitting: Vascular Surgery

## 2023-04-18 DIAGNOSIS — I83813 Varicose veins of bilateral lower extremities with pain: Secondary | ICD-10-CM | POA: Diagnosis not present

## 2023-05-22 NOTE — Progress Notes (Unsigned)
Indication:  Patient presents with symptomatic varicose veins of the bilateral lower extremity.  Procedure:  Sclerotherapy using hypertonic saline mixed with 1% Lidocaine was performed on the bilateral lower extremity.  Compression wraps were placed.  The patient tolerated the procedure well.  Plan:  Follow up as needed.

## 2023-05-23 ENCOUNTER — Ambulatory Visit (INDEPENDENT_AMBULATORY_CARE_PROVIDER_SITE_OTHER): Payer: 59 | Admitting: Vascular Surgery

## 2023-05-23 ENCOUNTER — Encounter (INDEPENDENT_AMBULATORY_CARE_PROVIDER_SITE_OTHER): Payer: Self-pay | Admitting: Vascular Surgery

## 2023-05-23 VITALS — BP 176/95 | HR 81 | Resp 18 | Ht 68.0 in | Wt 149.0 lb

## 2023-05-23 DIAGNOSIS — I83813 Varicose veins of bilateral lower extremities with pain: Secondary | ICD-10-CM

## 2023-06-06 ENCOUNTER — Encounter (INDEPENDENT_AMBULATORY_CARE_PROVIDER_SITE_OTHER): Payer: Self-pay | Admitting: Vascular Surgery

## 2023-06-06 ENCOUNTER — Ambulatory Visit (INDEPENDENT_AMBULATORY_CARE_PROVIDER_SITE_OTHER): Payer: 59 | Admitting: Vascular Surgery

## 2023-06-06 VITALS — BP 165/94 | HR 85 | Resp 16 | Ht 68.0 in | Wt 134.0 lb

## 2023-06-06 DIAGNOSIS — I83813 Varicose veins of bilateral lower extremities with pain: Secondary | ICD-10-CM

## 2023-06-06 NOTE — Progress Notes (Signed)
   Indication:  Patient presents with symptomatic varicose veins of the bilateral lower extremity.  Procedure:  Sclerotherapy using hypertonic saline mixed with 1% Lidocaine was performed on the bilateral lower extremity.  Compression wraps were placed.  The patient tolerated the procedure well.  Plan:  Follow up as needed.  

## 2023-07-26 DIAGNOSIS — M7918 Myalgia, other site: Secondary | ICD-10-CM | POA: Diagnosis not present

## 2023-07-26 DIAGNOSIS — M542 Cervicalgia: Secondary | ICD-10-CM | POA: Diagnosis not present

## 2023-07-26 DIAGNOSIS — M50922 Unspecified cervical disc disorder at C5-C6 level: Secondary | ICD-10-CM | POA: Diagnosis not present

## 2023-07-26 DIAGNOSIS — R519 Headache, unspecified: Secondary | ICD-10-CM | POA: Diagnosis not present

## 2023-07-26 DIAGNOSIS — M50923 Unspecified cervical disc disorder at C6-C7 level: Secondary | ICD-10-CM | POA: Diagnosis not present

## 2023-07-26 DIAGNOSIS — M26602 Left temporomandibular joint disorder, unspecified: Secondary | ICD-10-CM | POA: Diagnosis not present

## 2023-07-26 DIAGNOSIS — M9901 Segmental and somatic dysfunction of cervical region: Secondary | ICD-10-CM | POA: Diagnosis not present

## 2023-07-26 DIAGNOSIS — M9900 Segmental and somatic dysfunction of head region: Secondary | ICD-10-CM | POA: Diagnosis not present

## 2023-07-27 DIAGNOSIS — M50923 Unspecified cervical disc disorder at C6-C7 level: Secondary | ICD-10-CM | POA: Diagnosis not present

## 2023-07-27 DIAGNOSIS — M26602 Left temporomandibular joint disorder, unspecified: Secondary | ICD-10-CM | POA: Diagnosis not present

## 2023-07-27 DIAGNOSIS — M9901 Segmental and somatic dysfunction of cervical region: Secondary | ICD-10-CM | POA: Diagnosis not present

## 2023-07-27 DIAGNOSIS — M542 Cervicalgia: Secondary | ICD-10-CM | POA: Diagnosis not present

## 2023-07-27 DIAGNOSIS — M9900 Segmental and somatic dysfunction of head region: Secondary | ICD-10-CM | POA: Diagnosis not present

## 2023-07-27 DIAGNOSIS — R519 Headache, unspecified: Secondary | ICD-10-CM | POA: Diagnosis not present

## 2023-07-27 DIAGNOSIS — M7918 Myalgia, other site: Secondary | ICD-10-CM | POA: Diagnosis not present

## 2023-07-27 DIAGNOSIS — M50922 Unspecified cervical disc disorder at C5-C6 level: Secondary | ICD-10-CM | POA: Diagnosis not present

## 2023-08-11 DIAGNOSIS — R519 Headache, unspecified: Secondary | ICD-10-CM | POA: Diagnosis not present

## 2023-08-11 DIAGNOSIS — M50922 Unspecified cervical disc disorder at C5-C6 level: Secondary | ICD-10-CM | POA: Diagnosis not present

## 2023-08-11 DIAGNOSIS — M9901 Segmental and somatic dysfunction of cervical region: Secondary | ICD-10-CM | POA: Diagnosis not present

## 2023-08-11 DIAGNOSIS — M50923 Unspecified cervical disc disorder at C6-C7 level: Secondary | ICD-10-CM | POA: Diagnosis not present

## 2023-08-11 DIAGNOSIS — M9900 Segmental and somatic dysfunction of head region: Secondary | ICD-10-CM | POA: Diagnosis not present

## 2023-08-11 DIAGNOSIS — M7918 Myalgia, other site: Secondary | ICD-10-CM | POA: Diagnosis not present

## 2023-08-11 DIAGNOSIS — M26602 Left temporomandibular joint disorder, unspecified: Secondary | ICD-10-CM | POA: Diagnosis not present

## 2023-08-11 DIAGNOSIS — M542 Cervicalgia: Secondary | ICD-10-CM | POA: Diagnosis not present

## 2023-08-15 DIAGNOSIS — R519 Headache, unspecified: Secondary | ICD-10-CM | POA: Diagnosis not present

## 2023-08-15 DIAGNOSIS — M50922 Unspecified cervical disc disorder at C5-C6 level: Secondary | ICD-10-CM | POA: Diagnosis not present

## 2023-08-15 DIAGNOSIS — M9900 Segmental and somatic dysfunction of head region: Secondary | ICD-10-CM | POA: Diagnosis not present

## 2023-08-15 DIAGNOSIS — M7918 Myalgia, other site: Secondary | ICD-10-CM | POA: Diagnosis not present

## 2023-08-15 DIAGNOSIS — M542 Cervicalgia: Secondary | ICD-10-CM | POA: Diagnosis not present

## 2023-08-15 DIAGNOSIS — M9901 Segmental and somatic dysfunction of cervical region: Secondary | ICD-10-CM | POA: Diagnosis not present

## 2023-08-15 DIAGNOSIS — M50923 Unspecified cervical disc disorder at C6-C7 level: Secondary | ICD-10-CM | POA: Diagnosis not present

## 2023-08-15 DIAGNOSIS — M26602 Left temporomandibular joint disorder, unspecified: Secondary | ICD-10-CM | POA: Diagnosis not present

## 2023-08-17 DIAGNOSIS — M26602 Left temporomandibular joint disorder, unspecified: Secondary | ICD-10-CM | POA: Diagnosis not present

## 2023-08-17 DIAGNOSIS — M7918 Myalgia, other site: Secondary | ICD-10-CM | POA: Diagnosis not present

## 2023-08-17 DIAGNOSIS — M542 Cervicalgia: Secondary | ICD-10-CM | POA: Diagnosis not present

## 2023-08-17 DIAGNOSIS — M50922 Unspecified cervical disc disorder at C5-C6 level: Secondary | ICD-10-CM | POA: Diagnosis not present

## 2023-08-17 DIAGNOSIS — R519 Headache, unspecified: Secondary | ICD-10-CM | POA: Diagnosis not present

## 2023-08-17 DIAGNOSIS — M50923 Unspecified cervical disc disorder at C6-C7 level: Secondary | ICD-10-CM | POA: Diagnosis not present

## 2023-08-17 DIAGNOSIS — R634 Abnormal weight loss: Secondary | ICD-10-CM | POA: Diagnosis not present

## 2023-08-17 DIAGNOSIS — Z794 Long term (current) use of insulin: Secondary | ICD-10-CM | POA: Diagnosis not present

## 2023-08-17 DIAGNOSIS — M9901 Segmental and somatic dysfunction of cervical region: Secondary | ICD-10-CM | POA: Diagnosis not present

## 2023-08-17 DIAGNOSIS — E1165 Type 2 diabetes mellitus with hyperglycemia: Secondary | ICD-10-CM | POA: Diagnosis not present

## 2023-08-17 DIAGNOSIS — M9900 Segmental and somatic dysfunction of head region: Secondary | ICD-10-CM | POA: Diagnosis not present

## 2023-08-18 DIAGNOSIS — M542 Cervicalgia: Secondary | ICD-10-CM | POA: Diagnosis not present

## 2023-08-18 DIAGNOSIS — M50922 Unspecified cervical disc disorder at C5-C6 level: Secondary | ICD-10-CM | POA: Diagnosis not present

## 2023-08-18 DIAGNOSIS — R519 Headache, unspecified: Secondary | ICD-10-CM | POA: Diagnosis not present

## 2023-08-18 DIAGNOSIS — M7918 Myalgia, other site: Secondary | ICD-10-CM | POA: Diagnosis not present

## 2023-08-18 DIAGNOSIS — M9901 Segmental and somatic dysfunction of cervical region: Secondary | ICD-10-CM | POA: Diagnosis not present

## 2023-08-18 DIAGNOSIS — M26602 Left temporomandibular joint disorder, unspecified: Secondary | ICD-10-CM | POA: Diagnosis not present

## 2023-08-18 DIAGNOSIS — M50923 Unspecified cervical disc disorder at C6-C7 level: Secondary | ICD-10-CM | POA: Diagnosis not present

## 2023-08-18 DIAGNOSIS — M9900 Segmental and somatic dysfunction of head region: Secondary | ICD-10-CM | POA: Diagnosis not present

## 2023-08-22 DIAGNOSIS — M9900 Segmental and somatic dysfunction of head region: Secondary | ICD-10-CM | POA: Diagnosis not present

## 2023-08-22 DIAGNOSIS — M7918 Myalgia, other site: Secondary | ICD-10-CM | POA: Diagnosis not present

## 2023-08-22 DIAGNOSIS — M26602 Left temporomandibular joint disorder, unspecified: Secondary | ICD-10-CM | POA: Diagnosis not present

## 2023-08-22 DIAGNOSIS — R519 Headache, unspecified: Secondary | ICD-10-CM | POA: Diagnosis not present

## 2023-08-22 DIAGNOSIS — M542 Cervicalgia: Secondary | ICD-10-CM | POA: Diagnosis not present

## 2023-08-22 DIAGNOSIS — M9901 Segmental and somatic dysfunction of cervical region: Secondary | ICD-10-CM | POA: Diagnosis not present

## 2023-08-22 DIAGNOSIS — M50922 Unspecified cervical disc disorder at C5-C6 level: Secondary | ICD-10-CM | POA: Diagnosis not present

## 2023-08-22 DIAGNOSIS — M50923 Unspecified cervical disc disorder at C6-C7 level: Secondary | ICD-10-CM | POA: Diagnosis not present

## 2023-08-24 DIAGNOSIS — M9901 Segmental and somatic dysfunction of cervical region: Secondary | ICD-10-CM | POA: Diagnosis not present

## 2023-08-24 DIAGNOSIS — M542 Cervicalgia: Secondary | ICD-10-CM | POA: Diagnosis not present

## 2023-08-24 DIAGNOSIS — M26602 Left temporomandibular joint disorder, unspecified: Secondary | ICD-10-CM | POA: Diagnosis not present

## 2023-08-24 DIAGNOSIS — M50922 Unspecified cervical disc disorder at C5-C6 level: Secondary | ICD-10-CM | POA: Diagnosis not present

## 2023-08-24 DIAGNOSIS — M50923 Unspecified cervical disc disorder at C6-C7 level: Secondary | ICD-10-CM | POA: Diagnosis not present

## 2023-08-24 DIAGNOSIS — M7918 Myalgia, other site: Secondary | ICD-10-CM | POA: Diagnosis not present

## 2023-08-24 DIAGNOSIS — M9900 Segmental and somatic dysfunction of head region: Secondary | ICD-10-CM | POA: Diagnosis not present

## 2023-08-24 DIAGNOSIS — R519 Headache, unspecified: Secondary | ICD-10-CM | POA: Diagnosis not present

## 2023-08-25 DIAGNOSIS — M50923 Unspecified cervical disc disorder at C6-C7 level: Secondary | ICD-10-CM | POA: Diagnosis not present

## 2023-08-25 DIAGNOSIS — M542 Cervicalgia: Secondary | ICD-10-CM | POA: Diagnosis not present

## 2023-08-25 DIAGNOSIS — M26602 Left temporomandibular joint disorder, unspecified: Secondary | ICD-10-CM | POA: Diagnosis not present

## 2023-08-25 DIAGNOSIS — M50922 Unspecified cervical disc disorder at C5-C6 level: Secondary | ICD-10-CM | POA: Diagnosis not present

## 2023-08-25 DIAGNOSIS — R519 Headache, unspecified: Secondary | ICD-10-CM | POA: Diagnosis not present

## 2023-08-25 DIAGNOSIS — M7918 Myalgia, other site: Secondary | ICD-10-CM | POA: Diagnosis not present

## 2023-08-25 DIAGNOSIS — M9900 Segmental and somatic dysfunction of head region: Secondary | ICD-10-CM | POA: Diagnosis not present

## 2023-08-25 DIAGNOSIS — M9901 Segmental and somatic dysfunction of cervical region: Secondary | ICD-10-CM | POA: Diagnosis not present

## 2023-08-30 ENCOUNTER — Other Ambulatory Visit: Payer: Self-pay | Admitting: Obstetrics and Gynecology

## 2023-08-30 DIAGNOSIS — M9901 Segmental and somatic dysfunction of cervical region: Secondary | ICD-10-CM | POA: Diagnosis not present

## 2023-08-30 DIAGNOSIS — M26602 Left temporomandibular joint disorder, unspecified: Secondary | ICD-10-CM | POA: Diagnosis not present

## 2023-08-30 DIAGNOSIS — M50922 Unspecified cervical disc disorder at C5-C6 level: Secondary | ICD-10-CM | POA: Diagnosis not present

## 2023-08-30 DIAGNOSIS — Z1231 Encounter for screening mammogram for malignant neoplasm of breast: Secondary | ICD-10-CM

## 2023-08-30 DIAGNOSIS — M542 Cervicalgia: Secondary | ICD-10-CM | POA: Diagnosis not present

## 2023-08-30 DIAGNOSIS — M7918 Myalgia, other site: Secondary | ICD-10-CM | POA: Diagnosis not present

## 2023-08-30 DIAGNOSIS — R519 Headache, unspecified: Secondary | ICD-10-CM | POA: Diagnosis not present

## 2023-08-30 DIAGNOSIS — M50923 Unspecified cervical disc disorder at C6-C7 level: Secondary | ICD-10-CM | POA: Diagnosis not present

## 2023-08-30 DIAGNOSIS — M9900 Segmental and somatic dysfunction of head region: Secondary | ICD-10-CM | POA: Diagnosis not present

## 2023-09-02 DIAGNOSIS — R4189 Other symptoms and signs involving cognitive functions and awareness: Secondary | ICD-10-CM | POA: Diagnosis not present

## 2023-09-02 DIAGNOSIS — E782 Mixed hyperlipidemia: Secondary | ICD-10-CM | POA: Diagnosis not present

## 2023-09-02 DIAGNOSIS — G8929 Other chronic pain: Secondary | ICD-10-CM | POA: Diagnosis not present

## 2023-09-02 DIAGNOSIS — I10 Essential (primary) hypertension: Secondary | ICD-10-CM | POA: Diagnosis not present

## 2023-09-02 DIAGNOSIS — E119 Type 2 diabetes mellitus without complications: Secondary | ICD-10-CM | POA: Diagnosis not present

## 2023-09-02 DIAGNOSIS — M25552 Pain in left hip: Secondary | ICD-10-CM | POA: Diagnosis not present

## 2023-09-05 DIAGNOSIS — M7918 Myalgia, other site: Secondary | ICD-10-CM | POA: Diagnosis not present

## 2023-09-05 DIAGNOSIS — M9900 Segmental and somatic dysfunction of head region: Secondary | ICD-10-CM | POA: Diagnosis not present

## 2023-09-05 DIAGNOSIS — M26602 Left temporomandibular joint disorder, unspecified: Secondary | ICD-10-CM | POA: Diagnosis not present

## 2023-09-05 DIAGNOSIS — M542 Cervicalgia: Secondary | ICD-10-CM | POA: Diagnosis not present

## 2023-09-05 DIAGNOSIS — M50923 Unspecified cervical disc disorder at C6-C7 level: Secondary | ICD-10-CM | POA: Diagnosis not present

## 2023-09-05 DIAGNOSIS — M50922 Unspecified cervical disc disorder at C5-C6 level: Secondary | ICD-10-CM | POA: Diagnosis not present

## 2023-09-05 DIAGNOSIS — M9901 Segmental and somatic dysfunction of cervical region: Secondary | ICD-10-CM | POA: Diagnosis not present

## 2023-09-05 DIAGNOSIS — R519 Headache, unspecified: Secondary | ICD-10-CM | POA: Diagnosis not present

## 2023-09-07 DIAGNOSIS — M7918 Myalgia, other site: Secondary | ICD-10-CM | POA: Diagnosis not present

## 2023-09-07 DIAGNOSIS — M9900 Segmental and somatic dysfunction of head region: Secondary | ICD-10-CM | POA: Diagnosis not present

## 2023-09-07 DIAGNOSIS — M542 Cervicalgia: Secondary | ICD-10-CM | POA: Diagnosis not present

## 2023-09-07 DIAGNOSIS — M50922 Unspecified cervical disc disorder at C5-C6 level: Secondary | ICD-10-CM | POA: Diagnosis not present

## 2023-09-07 DIAGNOSIS — M50923 Unspecified cervical disc disorder at C6-C7 level: Secondary | ICD-10-CM | POA: Diagnosis not present

## 2023-09-07 DIAGNOSIS — M9901 Segmental and somatic dysfunction of cervical region: Secondary | ICD-10-CM | POA: Diagnosis not present

## 2023-09-07 DIAGNOSIS — R519 Headache, unspecified: Secondary | ICD-10-CM | POA: Diagnosis not present

## 2023-09-07 DIAGNOSIS — M26602 Left temporomandibular joint disorder, unspecified: Secondary | ICD-10-CM | POA: Diagnosis not present

## 2023-09-09 DIAGNOSIS — R6882 Decreased libido: Secondary | ICD-10-CM | POA: Diagnosis not present

## 2023-09-09 DIAGNOSIS — N898 Other specified noninflammatory disorders of vagina: Secondary | ICD-10-CM | POA: Diagnosis not present

## 2023-09-12 DIAGNOSIS — R6882 Decreased libido: Secondary | ICD-10-CM | POA: Diagnosis not present

## 2023-09-12 DIAGNOSIS — E119 Type 2 diabetes mellitus without complications: Secondary | ICD-10-CM | POA: Diagnosis not present

## 2023-09-12 DIAGNOSIS — R7989 Other specified abnormal findings of blood chemistry: Secondary | ICD-10-CM | POA: Diagnosis not present

## 2023-09-13 DIAGNOSIS — M50922 Unspecified cervical disc disorder at C5-C6 level: Secondary | ICD-10-CM | POA: Diagnosis not present

## 2023-09-13 DIAGNOSIS — R519 Headache, unspecified: Secondary | ICD-10-CM | POA: Diagnosis not present

## 2023-09-13 DIAGNOSIS — M9901 Segmental and somatic dysfunction of cervical region: Secondary | ICD-10-CM | POA: Diagnosis not present

## 2023-09-13 DIAGNOSIS — M50923 Unspecified cervical disc disorder at C6-C7 level: Secondary | ICD-10-CM | POA: Diagnosis not present

## 2023-09-13 DIAGNOSIS — M542 Cervicalgia: Secondary | ICD-10-CM | POA: Diagnosis not present

## 2023-09-13 DIAGNOSIS — M9900 Segmental and somatic dysfunction of head region: Secondary | ICD-10-CM | POA: Diagnosis not present

## 2023-09-13 DIAGNOSIS — M26602 Left temporomandibular joint disorder, unspecified: Secondary | ICD-10-CM | POA: Diagnosis not present

## 2023-09-13 DIAGNOSIS — M7918 Myalgia, other site: Secondary | ICD-10-CM | POA: Diagnosis not present

## 2023-09-21 DIAGNOSIS — M7918 Myalgia, other site: Secondary | ICD-10-CM | POA: Diagnosis not present

## 2023-09-21 DIAGNOSIS — R519 Headache, unspecified: Secondary | ICD-10-CM | POA: Diagnosis not present

## 2023-09-21 DIAGNOSIS — M26602 Left temporomandibular joint disorder, unspecified: Secondary | ICD-10-CM | POA: Diagnosis not present

## 2023-09-21 DIAGNOSIS — M9900 Segmental and somatic dysfunction of head region: Secondary | ICD-10-CM | POA: Diagnosis not present

## 2023-09-21 DIAGNOSIS — M9901 Segmental and somatic dysfunction of cervical region: Secondary | ICD-10-CM | POA: Diagnosis not present

## 2023-09-21 DIAGNOSIS — M50923 Unspecified cervical disc disorder at C6-C7 level: Secondary | ICD-10-CM | POA: Diagnosis not present

## 2023-09-21 DIAGNOSIS — M50922 Unspecified cervical disc disorder at C5-C6 level: Secondary | ICD-10-CM | POA: Diagnosis not present

## 2023-09-21 DIAGNOSIS — M542 Cervicalgia: Secondary | ICD-10-CM | POA: Diagnosis not present

## 2023-09-22 DIAGNOSIS — M50922 Unspecified cervical disc disorder at C5-C6 level: Secondary | ICD-10-CM | POA: Diagnosis not present

## 2023-09-22 DIAGNOSIS — R519 Headache, unspecified: Secondary | ICD-10-CM | POA: Diagnosis not present

## 2023-09-22 DIAGNOSIS — M26602 Left temporomandibular joint disorder, unspecified: Secondary | ICD-10-CM | POA: Diagnosis not present

## 2023-09-22 DIAGNOSIS — M542 Cervicalgia: Secondary | ICD-10-CM | POA: Diagnosis not present

## 2023-09-22 DIAGNOSIS — M9901 Segmental and somatic dysfunction of cervical region: Secondary | ICD-10-CM | POA: Diagnosis not present

## 2023-09-22 DIAGNOSIS — M9900 Segmental and somatic dysfunction of head region: Secondary | ICD-10-CM | POA: Diagnosis not present

## 2023-09-22 DIAGNOSIS — M7918 Myalgia, other site: Secondary | ICD-10-CM | POA: Diagnosis not present

## 2023-09-22 DIAGNOSIS — M50923 Unspecified cervical disc disorder at C6-C7 level: Secondary | ICD-10-CM | POA: Diagnosis not present

## 2023-10-04 ENCOUNTER — Ambulatory Visit
Admission: RE | Admit: 2023-10-04 | Discharge: 2023-10-04 | Disposition: A | Discharge status: Home / Self Care | Payer: 59 | Source: Ambulatory Visit | Attending: Obstetrics and Gynecology | Admitting: Obstetrics and Gynecology

## 2023-10-04 DIAGNOSIS — Z1231 Encounter for screening mammogram for malignant neoplasm of breast: Secondary | ICD-10-CM | POA: Insufficient documentation

## 2023-10-20 DIAGNOSIS — M50923 Unspecified cervical disc disorder at C6-C7 level: Secondary | ICD-10-CM | POA: Diagnosis not present

## 2023-10-20 DIAGNOSIS — R519 Headache, unspecified: Secondary | ICD-10-CM | POA: Diagnosis not present

## 2023-10-20 DIAGNOSIS — M9901 Segmental and somatic dysfunction of cervical region: Secondary | ICD-10-CM | POA: Diagnosis not present

## 2023-10-20 DIAGNOSIS — M9900 Segmental and somatic dysfunction of head region: Secondary | ICD-10-CM | POA: Diagnosis not present

## 2023-10-20 DIAGNOSIS — M542 Cervicalgia: Secondary | ICD-10-CM | POA: Diagnosis not present

## 2023-10-20 DIAGNOSIS — M50922 Unspecified cervical disc disorder at C5-C6 level: Secondary | ICD-10-CM | POA: Diagnosis not present

## 2023-10-20 DIAGNOSIS — M26602 Left temporomandibular joint disorder, unspecified: Secondary | ICD-10-CM | POA: Diagnosis not present

## 2023-10-20 DIAGNOSIS — M7918 Myalgia, other site: Secondary | ICD-10-CM | POA: Diagnosis not present

## 2023-10-25 DIAGNOSIS — M50922 Unspecified cervical disc disorder at C5-C6 level: Secondary | ICD-10-CM | POA: Diagnosis not present

## 2023-10-25 DIAGNOSIS — M542 Cervicalgia: Secondary | ICD-10-CM | POA: Diagnosis not present

## 2023-10-25 DIAGNOSIS — M9900 Segmental and somatic dysfunction of head region: Secondary | ICD-10-CM | POA: Diagnosis not present

## 2023-10-25 DIAGNOSIS — M26602 Left temporomandibular joint disorder, unspecified: Secondary | ICD-10-CM | POA: Diagnosis not present

## 2023-10-25 DIAGNOSIS — M50923 Unspecified cervical disc disorder at C6-C7 level: Secondary | ICD-10-CM | POA: Diagnosis not present

## 2023-10-25 DIAGNOSIS — R519 Headache, unspecified: Secondary | ICD-10-CM | POA: Diagnosis not present

## 2023-10-25 DIAGNOSIS — M9901 Segmental and somatic dysfunction of cervical region: Secondary | ICD-10-CM | POA: Diagnosis not present

## 2023-10-25 DIAGNOSIS — M7918 Myalgia, other site: Secondary | ICD-10-CM | POA: Diagnosis not present

## 2023-10-27 DIAGNOSIS — R519 Headache, unspecified: Secondary | ICD-10-CM | POA: Diagnosis not present

## 2023-10-27 DIAGNOSIS — M9901 Segmental and somatic dysfunction of cervical region: Secondary | ICD-10-CM | POA: Diagnosis not present

## 2023-10-27 DIAGNOSIS — M7918 Myalgia, other site: Secondary | ICD-10-CM | POA: Diagnosis not present

## 2023-10-27 DIAGNOSIS — M50923 Unspecified cervical disc disorder at C6-C7 level: Secondary | ICD-10-CM | POA: Diagnosis not present

## 2023-10-27 DIAGNOSIS — M9900 Segmental and somatic dysfunction of head region: Secondary | ICD-10-CM | POA: Diagnosis not present

## 2023-10-27 DIAGNOSIS — M542 Cervicalgia: Secondary | ICD-10-CM | POA: Diagnosis not present

## 2023-10-27 DIAGNOSIS — M50922 Unspecified cervical disc disorder at C5-C6 level: Secondary | ICD-10-CM | POA: Diagnosis not present

## 2023-10-27 DIAGNOSIS — M26602 Left temporomandibular joint disorder, unspecified: Secondary | ICD-10-CM | POA: Diagnosis not present

## 2023-11-09 DIAGNOSIS — M9900 Segmental and somatic dysfunction of head region: Secondary | ICD-10-CM | POA: Diagnosis not present

## 2023-11-09 DIAGNOSIS — M26602 Left temporomandibular joint disorder, unspecified: Secondary | ICD-10-CM | POA: Diagnosis not present

## 2023-11-09 DIAGNOSIS — M7918 Myalgia, other site: Secondary | ICD-10-CM | POA: Diagnosis not present

## 2023-11-09 DIAGNOSIS — M50923 Unspecified cervical disc disorder at C6-C7 level: Secondary | ICD-10-CM | POA: Diagnosis not present

## 2023-11-09 DIAGNOSIS — M9901 Segmental and somatic dysfunction of cervical region: Secondary | ICD-10-CM | POA: Diagnosis not present

## 2023-11-09 DIAGNOSIS — M542 Cervicalgia: Secondary | ICD-10-CM | POA: Diagnosis not present

## 2023-11-09 DIAGNOSIS — R519 Headache, unspecified: Secondary | ICD-10-CM | POA: Diagnosis not present

## 2023-11-09 DIAGNOSIS — M50922 Unspecified cervical disc disorder at C5-C6 level: Secondary | ICD-10-CM | POA: Diagnosis not present

## 2023-11-23 DIAGNOSIS — E119 Type 2 diabetes mellitus without complications: Secondary | ICD-10-CM | POA: Diagnosis not present

## 2023-11-29 DIAGNOSIS — D508 Other iron deficiency anemias: Secondary | ICD-10-CM | POA: Diagnosis not present

## 2023-11-29 DIAGNOSIS — I10 Essential (primary) hypertension: Secondary | ICD-10-CM | POA: Diagnosis not present

## 2023-11-29 DIAGNOSIS — E538 Deficiency of other specified B group vitamins: Secondary | ICD-10-CM | POA: Diagnosis not present

## 2023-11-29 DIAGNOSIS — E119 Type 2 diabetes mellitus without complications: Secondary | ICD-10-CM | POA: Diagnosis not present

## 2023-12-05 DIAGNOSIS — M542 Cervicalgia: Secondary | ICD-10-CM | POA: Diagnosis not present

## 2023-12-05 DIAGNOSIS — M50922 Unspecified cervical disc disorder at C5-C6 level: Secondary | ICD-10-CM | POA: Diagnosis not present

## 2023-12-05 DIAGNOSIS — M7918 Myalgia, other site: Secondary | ICD-10-CM | POA: Diagnosis not present

## 2023-12-05 DIAGNOSIS — M50923 Unspecified cervical disc disorder at C6-C7 level: Secondary | ICD-10-CM | POA: Diagnosis not present

## 2023-12-05 DIAGNOSIS — M26602 Left temporomandibular joint disorder, unspecified: Secondary | ICD-10-CM | POA: Diagnosis not present

## 2023-12-05 DIAGNOSIS — R519 Headache, unspecified: Secondary | ICD-10-CM | POA: Diagnosis not present

## 2023-12-05 DIAGNOSIS — M9901 Segmental and somatic dysfunction of cervical region: Secondary | ICD-10-CM | POA: Diagnosis not present

## 2023-12-05 DIAGNOSIS — M9900 Segmental and somatic dysfunction of head region: Secondary | ICD-10-CM | POA: Diagnosis not present

## 2023-12-14 DIAGNOSIS — Z794 Long term (current) use of insulin: Secondary | ICD-10-CM | POA: Diagnosis not present

## 2023-12-14 DIAGNOSIS — E119 Type 2 diabetes mellitus without complications: Secondary | ICD-10-CM | POA: Diagnosis not present

## 2023-12-14 DIAGNOSIS — I10 Essential (primary) hypertension: Secondary | ICD-10-CM | POA: Diagnosis not present

## 2023-12-14 DIAGNOSIS — E162 Hypoglycemia, unspecified: Secondary | ICD-10-CM | POA: Diagnosis not present

## 2023-12-24 DIAGNOSIS — S93401A Sprain of unspecified ligament of right ankle, initial encounter: Secondary | ICD-10-CM | POA: Diagnosis not present

## 2023-12-24 DIAGNOSIS — M7731 Calcaneal spur, right foot: Secondary | ICD-10-CM | POA: Diagnosis not present

## 2023-12-24 DIAGNOSIS — S82401A Unspecified fracture of shaft of right fibula, initial encounter for closed fracture: Secondary | ICD-10-CM | POA: Diagnosis not present

## 2023-12-24 DIAGNOSIS — S82831A Other fracture of upper and lower end of right fibula, initial encounter for closed fracture: Secondary | ICD-10-CM | POA: Diagnosis not present

## 2023-12-24 DIAGNOSIS — W101XXA Fall (on)(from) sidewalk curb, initial encounter: Secondary | ICD-10-CM | POA: Diagnosis not present

## 2023-12-24 DIAGNOSIS — Y9389 Activity, other specified: Secondary | ICD-10-CM | POA: Diagnosis not present

## 2023-12-24 DIAGNOSIS — M25571 Pain in right ankle and joints of right foot: Secondary | ICD-10-CM | POA: Diagnosis not present

## 2023-12-24 DIAGNOSIS — R202 Paresthesia of skin: Secondary | ICD-10-CM | POA: Diagnosis not present

## 2023-12-24 DIAGNOSIS — Y92410 Unspecified street and highway as the place of occurrence of the external cause: Secondary | ICD-10-CM | POA: Diagnosis not present

## 2023-12-27 ENCOUNTER — Encounter (INDEPENDENT_AMBULATORY_CARE_PROVIDER_SITE_OTHER): Payer: Self-pay

## 2023-12-29 ENCOUNTER — Ambulatory Visit (INDEPENDENT_AMBULATORY_CARE_PROVIDER_SITE_OTHER)

## 2023-12-29 ENCOUNTER — Ambulatory Visit: Admitting: Podiatry

## 2023-12-29 DIAGNOSIS — S82891D Other fracture of right lower leg, subsequent encounter for closed fracture with routine healing: Secondary | ICD-10-CM | POA: Diagnosis not present

## 2023-12-29 MED ORDER — HYDROCORTISONE 1 % EX OINT: 1.0000 | TOPICAL_OINTMENT | Freq: Two times a day (BID) | CUTANEOUS | 0 refills | Status: AC

## 2023-12-29 NOTE — Progress Notes (Signed)
 Subjective:  Patient ID: Judith Fox, female    DOB: 1965-08-21,  MRN: [id number]  Chief Complaint  Patient presents with   Fracture    Right ankle     58 y.o. female presents with the above complaint.  Patient presents with complaint of right ankle fracture.  She states that she was on a trip [place]  she twisted and fell and broke her ankle.  She went to the ER emergency room there.  They did multiple x-rays and was diagnosed with avulsion fracture of the fibula small nondisplaced.  They are here for another opinion.  Denies any other acute complaints pain is tolerable controlled.  Pain scale 7 out of 10 dull aching nature she is currently nonweightbearing with crutches and Cam boot   Review of Systems: Negative except as noted in the HPI. Denies N/V/F/Ch.  Past Medical History:  Diagnosis Date   Allergy    Anxiety    Arthritis    KNEE   Asthma    "mild" per pt   Complication of anesthesia    PT HAD A MI AFTER KNEE SURGERY IN 2014 FROM HIGH BLOOD PRESSURE-SURGERY WAS DONE AT AN OUTPATIENT [facility]   Diabetes mellitus without complication (HCC)    Dysrhythmia    sometimes, d/t stress, followed by cardiology   History of hiatal hernia    History of kidney stones    Hypertension    Hypomagnesemia    Melanoma in Situ    >28 years ago   Migraine    Myocardial infarction [facility]) 2014   no stents   Palpitation    Pneumonia    h/o   Simple chronic bronchitis (HCC)    Syncope and collapse 07/30/2013   Varicose veins of both lower extremities with pain     Current Outpatient Medications:    hydrocortisone 1 % ointment, Apply 1 Application topically 2 (two) times daily., Disp: 30 g, Rfl: 0   albuterol  (VENTOLIN  HFA) 108 (90 Base) MCG/ACT inhaler, Inhale 2 puffs into the lungs every 6 (six) hours as needed for wheezing or shortness of breath. (Patient not taking: Reported on 11/11/2022), Disp: 8 g, Rfl: 2   aspirin  EC 81 MG tablet, Take 81 mg by mouth daily., Disp: ,  Rfl:    atorvastatin  (LIPITOR) 40 MG tablet, TAKE 1 TABLET BY MOUTH EVERY DAY, Disp: 90 tablet, Rfl: 0   benazepril  (LOTENSIN ) 40 MG tablet, TAKE 1 TABLET BY MOUTH EVERY DAY, Disp: 90 tablet, Rfl: 0   CALCIUM -VITAMIN D  PO, Take 2 tablets by mouth daily.  (Patient not taking: Reported on 11/11/2022), Disp: , Rfl:    chlorpheniramine-HYDROcodone  (TUSSIONEX) 10-8 MG/5ML, Take 5 mLs by mouth every 12 (twelve) hours as needed., Disp: 115 mL, Rfl: 0   doxycycline  (PERIOSTAT ) 20 MG tablet, TAKE 1 TABLET (20 MG TOTAL) BY MOUTH 2 (TWO) TIMES DAILY. TAKE WITH FOOD, Disp: 180 tablet, Rfl: 0   fexofenadine (ALLEGRA) 180 MG tablet, Take 180 mg by mouth in the morning. (Patient not taking: Reported on 11/11/2022), Disp: , Rfl:    fluconazole  (DIFLUCAN ) 150 MG tablet, Take 150 mg by mouth once a week. And prn, Disp: , Rfl:    glipiZIDE  (GLUCOTROL ) 10 MG tablet, TAKE 1 TABLET BY MOUTH EVERY DAY BEFORE BREAKFAST, Disp: 30 tablet, Rfl: 0   levonorgestrel (LILETTA) 19.5 MCG/DAY IUD IUD, by Intrauterine route. (Patient not taking: Reported on 11/11/2022), Disp: , Rfl:    loratadine  (CLARITIN ) 10 MG tablet, Take 10 mg by mouth daily. (Patient  not taking: Reported on 11/11/2022), Disp: , Rfl:    meclizine  (ANTIVERT ) 25 MG tablet, Take 1 tablet (25 mg total) by mouth 3 (three) times daily as needed for dizziness., Disp: 30 tablet, Rfl: 2   meloxicam (MOBIC) 15 MG tablet, Take by mouth., Disp: , Rfl:    metFORMIN  (GLUCOPHAGE ) 1000 MG tablet, TAKE 1 TABLET BY MOUTH 2 TIMES DAILY WITH A MEAL., Disp: 180 tablet, Rfl: 0   metoCLOPramide  (REGLAN ) 5 MG/ML injection, Inject 10 mg into the vein daily as needed (migraine)., Disp: , Rfl:    Multiple Vitamin (MULTIVITAMIN) capsule, Take 1 capsule by mouth daily., Disp: , Rfl:    nitroGLYCERIN  (NITROSTAT ) 0.4 MG SL tablet, PLACE 1 TABLET UNDER THE TONGUE EVERY 5 MINUTES AS NEEDED FOR CHEST PAIN., Disp: 150 tablet, Rfl: 1   pantoprazole  (PROTONIX ) 40 MG tablet, Take 40 mg by mouth daily.,  Disp: , Rfl:    RESTASIS  0.05 % ophthalmic emulsion, Place 1 drop into both eyes 2 (two) times daily. , Disp: , Rfl:    sucralfate  (CARAFATE ) 1 g tablet, TAKE 1 TABLET (1 G TOTAL) BY MOUTH 4 (FOUR) TIMES DAILY - BEFORE MEALS AND AT BEDTIME., Disp: 120 tablet, Rfl: 2   testosterone  cypionate (DEPOTESTOSTERONE CYPIONATE) 200 MG/ML injection, Bring to office monthly for IM injections (Patient not taking: Reported on 11/11/2022), Disp: , Rfl:    TRULICITY  0.75 MG/0.5ML SOPN, INJECT 0.75 MG INTO THE SKIN ONCE A WEEK., Disp: 2 mL, Rfl: 11  Social History   Tobacco Use  Smoking Status Never  Smokeless Tobacco Never    Allergies  Allergen Reactions   Latex Rash and Swelling    Eye swelling    Butorphanol Other (See Comments)    drowsiness Somnolence Pt "knocked out for 12 hrs" when taking this    Lactose Intolerance (Gi) Other (See Comments)    Reflux and Indigestion   Objective:  There were no vitals filed for this visit. There is no height or weight on file to calculate BMI. Constitutional Well developed. Well nourished.  Vascular Dorsalis pedis pulses palpable bilaterally. Posterior tibial pulses palpable bilaterally. Capillary refill normal to all digits.  No cyanosis or clubbing noted. Pedal hair growth normal.  Neurologic Normal speech. Oriented to person, place, and time. Epicritic sensation to light touch grossly present bilaterally.  Dermatologic Nails well groomed and normal in appearance. No open wounds. No skin lesions.  Orthopedic: Pain on palpation to the right lateral ankle.  Pain with range of motion of the joint swelling noted.  No pain on the medial side no high fibular pain noted.  No other abnormalities identified no ecchymosis or bruising noted   Radiographs: 3 views of skeletally mature the right ankle: Mild nondisplaced fracture of the fibula noted.  No medial clear space gapping noted.  No other fractures noted. Assessment:   1. Closed fracture of right  ankle with routine healing, subsequent encounter    Plan:  Patient was evaluated and treated and all questions answered.  Right ankle fracture nondisplaced fibular - All questions and concerns were discussed with the patient extensively - I encouraged her to start ambulating with a cam boot on.  At this time she does not need to be nonweightbearing given that this is mild nondisplaced fibular fracture. - I will see her back again in 4 weeks.  If there is improvement we will transition her out of the boot into Tri-Lock ankle brace and regular shoes.  She states understanding  No follow-ups on  file.

## 2024-01-09 NOTE — Telephone Encounter (Signed)
 Received Claim form from [employer].  Pt to pick up in [facility] office Pt fractured ankle and can't travel on cruise. I faxed back to [facility] office and advise the pt is ready for pick up.

## 2024-01-09 NOTE — Telephone Encounter (Signed)
 lft mess on pt's vmail to adv forms are ready for p/up at [facility] office. I faxed to them. She also req her name changed to [facility] Page. I adv on vmail she needed to bring license that has her new name and we can update in our system   She is looking to get refund for cruise since she can't travel

## 2024-01-26 ENCOUNTER — Other Ambulatory Visit: Payer: Self-pay

## 2024-01-26 MED ORDER — DEXCOM G7 SENSOR MISC
1.0000 | 4 refills | Status: DC
  Filled 2024-01-26: qty 2, 20d supply, fill #0
  Filled 2024-01-26: qty 1, 10d supply, fill #1

## 2024-01-27 ENCOUNTER — Other Ambulatory Visit: Payer: Self-pay

## 2024-01-27 MED ORDER — DEXCOM G7 SENSOR MISC
1.0000 | 4 refills | Status: AC
  Filled 2024-01-27: qty 2, fill #0
  Filled 2024-02-16: qty 2, 20d supply, fill #0

## 2024-01-31 ENCOUNTER — Ambulatory Visit (INDEPENDENT_AMBULATORY_CARE_PROVIDER_SITE_OTHER): Admitting: Podiatry

## 2024-01-31 ENCOUNTER — Ambulatory Visit (INDEPENDENT_AMBULATORY_CARE_PROVIDER_SITE_OTHER)

## 2024-01-31 DIAGNOSIS — S82891D Other fracture of right lower leg, subsequent encounter for closed fracture with routine healing: Secondary | ICD-10-CM

## 2024-01-31 NOTE — Progress Notes (Unsigned)
 Subjective:  Patient ID: Judith Fox, female    DOB: Jan 19, 1966,  MRN: [id number]  Chief Complaint  Patient presents with   Fracture    Closed fracture of right ankle with routine healing, subsequent encounter    58 y.o. female presents with the above complaint.  Patient presents for follow-up of right ankle fracture.  She states she is doing a lot better denies any other acute complaints.  Pain is getting better is healing well.  She would like to discuss next treatment plan   Review of Systems: Negative except as noted in the HPI. Denies N/V/F/Ch.  Past Medical History:  Diagnosis Date   Allergy    Anxiety    Arthritis    KNEE   Asthma    mild per pt   Complication of anesthesia    PT HAD A MI AFTER KNEE SURGERY IN 2014 FROM HIGH BLOOD PRESSURE-SURGERY WAS DONE AT AN OUTPATIENT [facility]   Diabetes mellitus without complication (HCC)    Dysrhythmia    sometimes, d/t stress, followed by cardiology   History of hiatal hernia    History of kidney stones    Hypertension    Hypomagnesemia    Melanoma in Situ    >28 years ago   Migraine    Myocardial infarction [facility]) 2014   no stents   Palpitation    Pneumonia    h/o   Simple chronic bronchitis (HCC)    Syncope and collapse 07/30/2013   Varicose veins of both lower extremities with pain     Current Outpatient Medications:    albuterol  (VENTOLIN  HFA) 108 (90 Base) MCG/ACT inhaler, Inhale 2 puffs into the lungs every 6 (six) hours as needed for wheezing or shortness of breath. (Patient not taking: Reported on 11/11/2022), Disp: 8 g, Rfl: 2   aspirin  EC 81 MG tablet, Take 81 mg by mouth daily., Disp: , Rfl:    atorvastatin  (LIPITOR) 40 MG tablet, TAKE 1 TABLET BY MOUTH EVERY DAY, Disp: 90 tablet, Rfl: 0   benazepril  (LOTENSIN ) 40 MG tablet, TAKE 1 TABLET BY MOUTH EVERY DAY, Disp: 90 tablet, Rfl: 0   CALCIUM -VITAMIN D  PO, Take 2 tablets by mouth daily.  (Patient not taking: Reported on 11/11/2022), Disp: , Rfl:     chlorpheniramine-HYDROcodone  (TUSSIONEX) 10-8 MG/5ML, Take 5 mLs by mouth every 12 (twelve) hours as needed., Disp: 115 mL, Rfl: 0   Continuous Glucose Sensor (DEXCOM G7 SENSOR) MISC, Change/apply 1 sensor to the skin every 10 days., Disp: 2 each, Rfl: 4   doxycycline  (PERIOSTAT ) 20 MG tablet, TAKE 1 TABLET (20 MG TOTAL) BY MOUTH 2 (TWO) TIMES DAILY. TAKE WITH FOOD, Disp: 180 tablet, Rfl: 0   fexofenadine (ALLEGRA) 180 MG tablet, Take 180 mg by mouth in the morning. (Patient not taking: Reported on 11/11/2022), Disp: , Rfl:    fluconazole  (DIFLUCAN ) 150 MG tablet, Take 150 mg by mouth once a week. And prn, Disp: , Rfl:    glipiZIDE  (GLUCOTROL ) 10 MG tablet, TAKE 1 TABLET BY MOUTH EVERY DAY BEFORE BREAKFAST, Disp: 30 tablet, Rfl: 0   hydrocortisone  1 % ointment, Apply 1 Application topically 2 (two) times daily., Disp: 30 g, Rfl: 0   levonorgestrel (LILETTA) 19.5 MCG/DAY IUD IUD, by Intrauterine route. (Patient not taking: Reported on 11/11/2022), Disp: , Rfl:    loratadine  (CLARITIN ) 10 MG tablet, Take 10 mg by mouth daily. (Patient not taking: Reported on 11/11/2022), Disp: , Rfl:    meclizine  (ANTIVERT ) 25 MG tablet, Take 1  tablet (25 mg total) by mouth 3 (three) times daily as needed for dizziness., Disp: 30 tablet, Rfl: 2   meloxicam (MOBIC) 15 MG tablet, Take by mouth., Disp: , Rfl:    metFORMIN  (GLUCOPHAGE ) 1000 MG tablet, TAKE 1 TABLET BY MOUTH 2 TIMES DAILY WITH A MEAL., Disp: 180 tablet, Rfl: 0   metoCLOPramide  (REGLAN ) 5 MG/ML injection, Inject 10 mg into the vein daily as needed (migraine)., Disp: , Rfl:    Multiple Vitamin (MULTIVITAMIN) capsule, Take 1 capsule by mouth daily., Disp: , Rfl:    nitroGLYCERIN  (NITROSTAT ) 0.4 MG SL tablet, PLACE 1 TABLET UNDER THE TONGUE EVERY 5 MINUTES AS NEEDED FOR CHEST PAIN., Disp: 150 tablet, Rfl: 1   pantoprazole  (PROTONIX ) 40 MG tablet, Take 40 mg by mouth daily., Disp: , Rfl:    RESTASIS  0.05 % ophthalmic emulsion, Place 1 drop into both eyes 2 (two)  times daily. , Disp: , Rfl:    sucralfate  (CARAFATE ) 1 g tablet, TAKE 1 TABLET (1 G TOTAL) BY MOUTH 4 (FOUR) TIMES DAILY - BEFORE MEALS AND AT BEDTIME., Disp: 120 tablet, Rfl: 2   testosterone  cypionate (DEPOTESTOSTERONE CYPIONATE) 200 MG/ML injection, Bring to office monthly for IM injections (Patient not taking: Reported on 11/11/2022), Disp: , Rfl:    TRULICITY  0.75 MG/0.5ML SOPN, INJECT 0.75 MG INTO THE SKIN ONCE A WEEK., Disp: 2 mL, Rfl: 11  Social History   Tobacco Use  Smoking Status Never  Smokeless Tobacco Never    Allergies  Allergen Reactions   Latex Rash and Swelling    Eye swelling    Butorphanol Other (See Comments)    drowsiness Somnolence Pt knocked out for 12 hrs when taking this    Lactose Intolerance (Gi) Other (See Comments)    Reflux and Indigestion   Objective:  There were no vitals filed for this visit. There is no height or weight on file to calculate BMI. Constitutional Well developed. Well nourished.  Vascular Dorsalis pedis pulses palpable bilaterally. Posterior tibial pulses palpable bilaterally. Capillary refill normal to all digits.  No cyanosis or clubbing noted. Pedal hair growth normal.  Neurologic Normal speech. Oriented to person, place, and time. Epicritic sensation to light touch grossly present bilaterally.  Dermatologic Nails well groomed and normal in appearance. No open wounds. No skin lesions.  Orthopedic: Very mild pain on palpation to the right lateral ankle.  Very mild pain with range of motion of the joint swelling noted.  No pain on the medial side no high fibular pain noted.  No other abnormalities identified no ecchymosis or bruising noted   Radiographs: 3 views of skeletally mature the right ankle: Mild nondisplaced fracture of the fibula noted.  No medial clear space gapping noted.  No other fractures noted.  No further gapping noted Assessment:   1. Closed fracture of right ankle with routine healing, subsequent  encounter    Plan:  Patient was evaluated and treated and all questions answered.  Right ankle fracture nondisplaced fibular - All questions and concerns were discussed with the patient extensively - Clinically doing much better.  Patient can begin transition out of cam boot into regular shoes with Tri-Lock ankle brace.  2 units of Tri-Lock ankle brace was dispensed that she would like to have 1 pair for out of the water.  No follow-ups on file.

## 2024-02-16 ENCOUNTER — Other Ambulatory Visit: Payer: Self-pay

## 2024-02-20 ENCOUNTER — Other Ambulatory Visit: Payer: Self-pay

## 2024-02-20 MED ORDER — DEXCOM G7 SENSOR MISC
1.0000 | 3 refills | Status: AC
  Filled 2024-02-20 – 2024-03-05 (×3): qty 3, 30d supply, fill #0
  Filled 2024-03-26 – 2024-03-29 (×2): qty 3, 30d supply, fill #1
  Filled 2024-06-28: qty 3, 30d supply, fill #2
  Filled ????-??-??: fill #1

## 2024-02-29 ENCOUNTER — Other Ambulatory Visit: Payer: Self-pay

## 2024-02-29 DIAGNOSIS — M50922 Unspecified cervical disc disorder at C5-C6 level: Secondary | ICD-10-CM | POA: Diagnosis not present

## 2024-02-29 DIAGNOSIS — M50923 Unspecified cervical disc disorder at C6-C7 level: Secondary | ICD-10-CM | POA: Diagnosis not present

## 2024-02-29 DIAGNOSIS — M26602 Left temporomandibular joint disorder, unspecified: Secondary | ICD-10-CM | POA: Diagnosis not present

## 2024-02-29 DIAGNOSIS — M7918 Myalgia, other site: Secondary | ICD-10-CM | POA: Diagnosis not present

## 2024-02-29 DIAGNOSIS — M542 Cervicalgia: Secondary | ICD-10-CM | POA: Diagnosis not present

## 2024-02-29 DIAGNOSIS — M9901 Segmental and somatic dysfunction of cervical region: Secondary | ICD-10-CM | POA: Diagnosis not present

## 2024-02-29 DIAGNOSIS — R519 Headache, unspecified: Secondary | ICD-10-CM | POA: Diagnosis not present

## 2024-02-29 DIAGNOSIS — M9900 Segmental and somatic dysfunction of head region: Secondary | ICD-10-CM | POA: Diagnosis not present

## 2024-03-05 ENCOUNTER — Other Ambulatory Visit: Payer: Self-pay

## 2024-03-26 ENCOUNTER — Other Ambulatory Visit: Payer: Self-pay

## 2024-03-28 DIAGNOSIS — M26602 Left temporomandibular joint disorder, unspecified: Secondary | ICD-10-CM | POA: Diagnosis not present

## 2024-03-28 DIAGNOSIS — M50923 Unspecified cervical disc disorder at C6-C7 level: Secondary | ICD-10-CM | POA: Diagnosis not present

## 2024-03-28 DIAGNOSIS — M50922 Unspecified cervical disc disorder at C5-C6 level: Secondary | ICD-10-CM | POA: Diagnosis not present

## 2024-03-28 DIAGNOSIS — M542 Cervicalgia: Secondary | ICD-10-CM | POA: Diagnosis not present

## 2024-03-28 DIAGNOSIS — M9901 Segmental and somatic dysfunction of cervical region: Secondary | ICD-10-CM | POA: Diagnosis not present

## 2024-03-28 DIAGNOSIS — M7918 Myalgia, other site: Secondary | ICD-10-CM | POA: Diagnosis not present

## 2024-03-28 DIAGNOSIS — M9900 Segmental and somatic dysfunction of head region: Secondary | ICD-10-CM | POA: Diagnosis not present

## 2024-03-28 DIAGNOSIS — R519 Headache, unspecified: Secondary | ICD-10-CM | POA: Diagnosis not present

## 2024-05-23 DIAGNOSIS — M542 Cervicalgia: Secondary | ICD-10-CM | POA: Diagnosis not present

## 2024-05-23 DIAGNOSIS — M50923 Unspecified cervical disc disorder at C6-C7 level: Secondary | ICD-10-CM | POA: Diagnosis not present

## 2024-05-23 DIAGNOSIS — M50922 Unspecified cervical disc disorder at C5-C6 level: Secondary | ICD-10-CM | POA: Diagnosis not present

## 2024-05-23 DIAGNOSIS — M9901 Segmental and somatic dysfunction of cervical region: Secondary | ICD-10-CM | POA: Diagnosis not present

## 2024-05-23 DIAGNOSIS — R519 Headache, unspecified: Secondary | ICD-10-CM | POA: Diagnosis not present

## 2024-05-23 DIAGNOSIS — M7918 Myalgia, other site: Secondary | ICD-10-CM | POA: Diagnosis not present

## 2024-05-23 DIAGNOSIS — M26602 Left temporomandibular joint disorder, unspecified: Secondary | ICD-10-CM | POA: Diagnosis not present

## 2024-05-23 DIAGNOSIS — M9900 Segmental and somatic dysfunction of head region: Secondary | ICD-10-CM | POA: Diagnosis not present

## 2024-06-25 DIAGNOSIS — M7918 Myalgia, other site: Secondary | ICD-10-CM | POA: Diagnosis not present

## 2024-06-25 DIAGNOSIS — M5451 Vertebrogenic low back pain: Secondary | ICD-10-CM | POA: Diagnosis not present

## 2024-06-25 DIAGNOSIS — M9904 Segmental and somatic dysfunction of sacral region: Secondary | ICD-10-CM | POA: Diagnosis not present

## 2024-06-25 DIAGNOSIS — M9903 Segmental and somatic dysfunction of lumbar region: Secondary | ICD-10-CM | POA: Diagnosis not present

## 2024-06-25 DIAGNOSIS — M25552 Pain in left hip: Secondary | ICD-10-CM | POA: Diagnosis not present

## 2024-06-29 ENCOUNTER — Other Ambulatory Visit: Payer: Self-pay

## 2024-09-10 ENCOUNTER — Other Ambulatory Visit: Payer: Self-pay | Admitting: Internal Medicine

## 2024-09-10 DIAGNOSIS — Z1231 Encounter for screening mammogram for malignant neoplasm of breast: Secondary | ICD-10-CM

## 2024-10-15 ENCOUNTER — Encounter
